# Patient Record
Sex: Female | Born: 1960 | Race: White | Hispanic: No | Marital: Married | Smoking: Never smoker
Health system: Southern US, Community
[De-identification: ages and names within clinical notes are randomized; demographics above are authoritative.]

## PROBLEM LIST (undated history)

## (undated) DIAGNOSIS — F32A Depression, unspecified: Secondary | ICD-10-CM

## (undated) DIAGNOSIS — R200 Anesthesia of skin: Secondary | ICD-10-CM

## (undated) DIAGNOSIS — K219 Gastro-esophageal reflux disease without esophagitis: Secondary | ICD-10-CM

## (undated) DIAGNOSIS — L739 Follicular disorder, unspecified: Secondary | ICD-10-CM

## (undated) DIAGNOSIS — M1990 Unspecified osteoarthritis, unspecified site: Secondary | ICD-10-CM

## (undated) DIAGNOSIS — R011 Cardiac murmur, unspecified: Secondary | ICD-10-CM

## (undated) DIAGNOSIS — D692 Other nonthrombocytopenic purpura: Secondary | ICD-10-CM

## (undated) DIAGNOSIS — I341 Nonrheumatic mitral (valve) prolapse: Secondary | ICD-10-CM

## (undated) DIAGNOSIS — D649 Anemia, unspecified: Secondary | ICD-10-CM

## (undated) DIAGNOSIS — IMO0002 Reserved for concepts with insufficient information to code with codable children: Secondary | ICD-10-CM

## (undated) DIAGNOSIS — Z9889 Other specified postprocedural states: Secondary | ICD-10-CM

## (undated) DIAGNOSIS — T7840XA Allergy, unspecified, initial encounter: Secondary | ICD-10-CM

## (undated) DIAGNOSIS — I059 Rheumatic mitral valve disease, unspecified: Secondary | ICD-10-CM

## (undated) DIAGNOSIS — R9431 Abnormal electrocardiogram [ECG] [EKG]: Secondary | ICD-10-CM

## (undated) DIAGNOSIS — Z5189 Encounter for other specified aftercare: Secondary | ICD-10-CM

## (undated) DIAGNOSIS — F419 Anxiety disorder, unspecified: Secondary | ICD-10-CM

## (undated) DIAGNOSIS — R1310 Dysphagia, unspecified: Secondary | ICD-10-CM

## (undated) DIAGNOSIS — F329 Major depressive disorder, single episode, unspecified: Secondary | ICD-10-CM

## (undated) DIAGNOSIS — I639 Cerebral infarction, unspecified: Secondary | ICD-10-CM

## (undated) DIAGNOSIS — K208 Other esophagitis: Secondary | ICD-10-CM

## (undated) DIAGNOSIS — G459 Transient cerebral ischemic attack, unspecified: Secondary | ICD-10-CM

## (undated) DIAGNOSIS — R112 Nausea with vomiting, unspecified: Secondary | ICD-10-CM

## (undated) DIAGNOSIS — E039 Hypothyroidism, unspecified: Secondary | ICD-10-CM

## (undated) DIAGNOSIS — R55 Syncope and collapse: Secondary | ICD-10-CM

## (undated) HISTORY — PX: UPPER GASTROINTESTINAL ENDOSCOPY: SHX188

## (undated) HISTORY — DX: Major depressive disorder, single episode, unspecified: F32.9

## (undated) HISTORY — DX: Other esophagitis: K20.8

## (undated) HISTORY — DX: Cerebral infarction, unspecified: I63.9

## (undated) HISTORY — DX: Allergy, unspecified, initial encounter: T78.40XA

## (undated) HISTORY — PX: COLONOSCOPY: SHX174

## (undated) HISTORY — DX: Follicular disorder, unspecified: L73.9

## (undated) HISTORY — DX: Hypothyroidism, unspecified: E03.9

## (undated) HISTORY — DX: Nonrheumatic mitral (valve) prolapse: I34.1

## (undated) HISTORY — DX: Gastro-esophageal reflux disease without esophagitis: K21.9

## (undated) HISTORY — DX: Encounter for other specified aftercare: Z51.89

## (undated) HISTORY — DX: Rheumatic mitral valve disease, unspecified: I05.9

## (undated) HISTORY — DX: Depression, unspecified: F32.A

## (undated) HISTORY — DX: Dysphagia, unspecified: R13.10

## (undated) HISTORY — DX: Unspecified osteoarthritis, unspecified site: M19.90

## (undated) HISTORY — PX: APPENDECTOMY: SHX54

## (undated) HISTORY — DX: Anesthesia of skin: R20.0

## (undated) HISTORY — DX: Reserved for concepts with insufficient information to code with codable children: IMO0002

## (undated) HISTORY — DX: Transient cerebral ischemic attack, unspecified: G45.9

## (undated) HISTORY — DX: Abnormal electrocardiogram (ECG) (EKG): R94.31

## (undated) HISTORY — DX: Other nonthrombocytopenic purpura: D69.2

## (undated) HISTORY — DX: Anemia, unspecified: D64.9

## (undated) HISTORY — DX: Syncope and collapse: R55

---

## 1997-11-10 ENCOUNTER — Ambulatory Visit (HOSPITAL_COMMUNITY): Admission: RE | Admit: 1997-11-10 | Discharge: 1997-11-10 | Payer: Self-pay | Admitting: Obstetrics and Gynecology

## 1998-05-14 ENCOUNTER — Ambulatory Visit (HOSPITAL_COMMUNITY): Admission: RE | Admit: 1998-05-14 | Discharge: 1998-05-14 | Payer: Self-pay | Admitting: Obstetrics and Gynecology

## 1998-12-09 ENCOUNTER — Encounter: Payer: Self-pay | Admitting: Internal Medicine

## 1998-12-09 ENCOUNTER — Ambulatory Visit (HOSPITAL_COMMUNITY): Admission: RE | Admit: 1998-12-09 | Discharge: 1998-12-09 | Payer: Self-pay | Admitting: Internal Medicine

## 2001-02-06 ENCOUNTER — Other Ambulatory Visit: Admission: RE | Admit: 2001-02-06 | Discharge: 2001-02-06 | Payer: Self-pay | Admitting: Obstetrics and Gynecology

## 2001-02-11 ENCOUNTER — Encounter: Admission: RE | Admit: 2001-02-11 | Discharge: 2001-02-11 | Payer: Self-pay | Admitting: Obstetrics and Gynecology

## 2001-02-11 ENCOUNTER — Encounter: Payer: Self-pay | Admitting: Obstetrics and Gynecology

## 2002-05-05 ENCOUNTER — Encounter: Payer: Self-pay | Admitting: Obstetrics and Gynecology

## 2002-05-05 ENCOUNTER — Encounter: Admission: RE | Admit: 2002-05-05 | Discharge: 2002-05-05 | Payer: Self-pay | Admitting: Obstetrics and Gynecology

## 2002-05-27 ENCOUNTER — Encounter: Admission: RE | Admit: 2002-05-27 | Discharge: 2002-05-27 | Payer: Self-pay | Admitting: Obstetrics and Gynecology

## 2002-05-27 ENCOUNTER — Encounter: Payer: Self-pay | Admitting: Obstetrics and Gynecology

## 2002-06-30 ENCOUNTER — Encounter: Payer: Self-pay | Admitting: Obstetrics and Gynecology

## 2002-06-30 ENCOUNTER — Encounter: Admission: RE | Admit: 2002-06-30 | Discharge: 2002-06-30 | Payer: Self-pay | Admitting: Obstetrics and Gynecology

## 2002-07-04 ENCOUNTER — Encounter: Payer: Self-pay | Admitting: Obstetrics and Gynecology

## 2002-07-04 ENCOUNTER — Encounter: Admission: RE | Admit: 2002-07-04 | Discharge: 2002-07-04 | Payer: Self-pay | Admitting: Obstetrics and Gynecology

## 2002-08-14 HISTORY — PX: UPPER GASTROINTESTINAL ENDOSCOPY: SHX188

## 2002-10-31 ENCOUNTER — Encounter: Payer: Self-pay | Admitting: Internal Medicine

## 2002-11-12 ENCOUNTER — Encounter: Admission: RE | Admit: 2002-11-12 | Discharge: 2002-11-12 | Payer: Self-pay | Admitting: Obstetrics and Gynecology

## 2002-11-12 ENCOUNTER — Encounter: Payer: Self-pay | Admitting: Obstetrics and Gynecology

## 2003-11-13 LAB — HM MAMMOGRAPHY

## 2003-11-30 ENCOUNTER — Encounter: Admission: RE | Admit: 2003-11-30 | Discharge: 2003-11-30 | Payer: Self-pay | Admitting: Obstetrics and Gynecology

## 2003-12-23 ENCOUNTER — Encounter: Payer: Self-pay | Admitting: Internal Medicine

## 2004-06-07 ENCOUNTER — Other Ambulatory Visit: Admission: RE | Admit: 2004-06-07 | Discharge: 2004-06-07 | Payer: Self-pay | Admitting: Obstetrics and Gynecology

## 2004-07-25 ENCOUNTER — Inpatient Hospital Stay (HOSPITAL_COMMUNITY): Admission: AD | Admit: 2004-07-25 | Discharge: 2004-07-25 | Payer: Self-pay | Admitting: Obstetrics and Gynecology

## 2004-07-27 ENCOUNTER — Encounter: Admission: RE | Admit: 2004-07-27 | Discharge: 2004-07-27 | Payer: Self-pay | Admitting: Obstetrics and Gynecology

## 2004-08-24 ENCOUNTER — Ambulatory Visit: Payer: Self-pay | Admitting: Internal Medicine

## 2004-12-12 LAB — HM COLONOSCOPY: HM Colonoscopy: NORMAL

## 2004-12-26 ENCOUNTER — Encounter: Admission: RE | Admit: 2004-12-26 | Discharge: 2004-12-26 | Payer: Self-pay | Admitting: Obstetrics and Gynecology

## 2005-01-30 ENCOUNTER — Ambulatory Visit: Payer: Self-pay | Admitting: Internal Medicine

## 2005-06-08 ENCOUNTER — Other Ambulatory Visit: Admission: RE | Admit: 2005-06-08 | Discharge: 2005-06-08 | Payer: Self-pay | Admitting: Obstetrics and Gynecology

## 2005-08-14 DIAGNOSIS — T50905A Adverse effect of unspecified drugs, medicaments and biological substances, initial encounter: Secondary | ICD-10-CM

## 2005-08-14 HISTORY — DX: Adverse effect of unspecified drugs, medicaments and biological substances, initial encounter: T50.905A

## 2005-10-04 ENCOUNTER — Ambulatory Visit: Payer: Self-pay | Admitting: Internal Medicine

## 2005-10-13 ENCOUNTER — Encounter: Payer: Self-pay | Admitting: Internal Medicine

## 2005-10-13 ENCOUNTER — Ambulatory Visit: Payer: Self-pay | Admitting: Gastroenterology

## 2005-10-13 ENCOUNTER — Ambulatory Visit (HOSPITAL_COMMUNITY): Admission: RE | Admit: 2005-10-13 | Discharge: 2005-10-13 | Payer: Self-pay | Admitting: Gastroenterology

## 2005-10-13 ENCOUNTER — Encounter (INDEPENDENT_AMBULATORY_CARE_PROVIDER_SITE_OTHER): Payer: Self-pay | Admitting: Specialist

## 2005-10-16 ENCOUNTER — Emergency Department (HOSPITAL_COMMUNITY): Admission: EM | Admit: 2005-10-16 | Discharge: 2005-10-16 | Payer: Self-pay | Admitting: Emergency Medicine

## 2005-10-16 ENCOUNTER — Ambulatory Visit: Payer: Self-pay | Admitting: Gastroenterology

## 2005-10-19 ENCOUNTER — Ambulatory Visit: Payer: Self-pay | Admitting: Internal Medicine

## 2005-10-27 ENCOUNTER — Ambulatory Visit: Payer: Self-pay | Admitting: Internal Medicine

## 2005-11-02 ENCOUNTER — Ambulatory Visit: Payer: Self-pay | Admitting: Internal Medicine

## 2006-01-17 ENCOUNTER — Other Ambulatory Visit: Admission: RE | Admit: 2006-01-17 | Discharge: 2006-01-17 | Payer: Self-pay | Admitting: Obstetrics and Gynecology

## 2006-01-25 ENCOUNTER — Ambulatory Visit (HOSPITAL_COMMUNITY): Admission: RE | Admit: 2006-01-25 | Discharge: 2006-01-25 | Payer: Self-pay | Admitting: Obstetrics and Gynecology

## 2006-06-19 ENCOUNTER — Ambulatory Visit: Payer: Self-pay | Admitting: Internal Medicine

## 2006-06-22 ENCOUNTER — Other Ambulatory Visit: Admission: RE | Admit: 2006-06-22 | Discharge: 2006-06-22 | Payer: Self-pay | Admitting: Obstetrics and Gynecology

## 2006-08-29 ENCOUNTER — Encounter: Admission: RE | Admit: 2006-08-29 | Discharge: 2006-08-29 | Payer: Self-pay | Admitting: Obstetrics and Gynecology

## 2006-10-04 ENCOUNTER — Other Ambulatory Visit: Admission: RE | Admit: 2006-10-04 | Discharge: 2006-10-04 | Payer: Self-pay | Admitting: Obstetrics and Gynecology

## 2006-11-30 ENCOUNTER — Ambulatory Visit: Payer: Self-pay | Admitting: Internal Medicine

## 2007-06-26 ENCOUNTER — Other Ambulatory Visit: Admission: RE | Admit: 2007-06-26 | Discharge: 2007-06-26 | Payer: Self-pay | Admitting: Obstetrics and Gynecology

## 2007-06-26 ENCOUNTER — Encounter: Payer: Self-pay | Admitting: Internal Medicine

## 2007-07-18 DIAGNOSIS — E039 Hypothyroidism, unspecified: Secondary | ICD-10-CM

## 2007-07-18 DIAGNOSIS — K219 Gastro-esophageal reflux disease without esophagitis: Secondary | ICD-10-CM | POA: Insufficient documentation

## 2007-07-18 DIAGNOSIS — I059 Rheumatic mitral valve disease, unspecified: Secondary | ICD-10-CM

## 2007-07-18 DIAGNOSIS — I341 Nonrheumatic mitral (valve) prolapse: Secondary | ICD-10-CM

## 2007-07-18 HISTORY — DX: Gastro-esophageal reflux disease without esophagitis: K21.9

## 2007-07-18 HISTORY — DX: Hypothyroidism, unspecified: E03.9

## 2007-07-18 HISTORY — DX: Rheumatic mitral valve disease, unspecified: I05.9

## 2007-07-18 HISTORY — DX: Nonrheumatic mitral (valve) prolapse: I34.1

## 2007-09-09 ENCOUNTER — Encounter: Admission: RE | Admit: 2007-09-09 | Discharge: 2007-09-09 | Payer: Self-pay | Admitting: Obstetrics and Gynecology

## 2007-09-16 ENCOUNTER — Ambulatory Visit: Payer: Self-pay | Admitting: Internal Medicine

## 2007-09-17 LAB — CONVERTED CEMR LAB
Free T4: 0.7 ng/dL (ref 0.6–1.6)
T3, Free: 2.5 pg/mL (ref 2.3–4.2)
T4, Total: 6.2 ug/dL (ref 5.0–12.5)
TSH: 5.12 microintl units/mL (ref 0.35–5.50)

## 2008-05-11 ENCOUNTER — Ambulatory Visit: Payer: Self-pay | Admitting: Internal Medicine

## 2008-07-20 ENCOUNTER — Other Ambulatory Visit: Admission: RE | Admit: 2008-07-20 | Discharge: 2008-07-20 | Payer: Self-pay | Admitting: Obstetrics and Gynecology

## 2008-09-14 ENCOUNTER — Encounter: Admission: RE | Admit: 2008-09-14 | Discharge: 2008-09-14 | Payer: Self-pay | Admitting: Obstetrics and Gynecology

## 2008-09-15 ENCOUNTER — Telehealth: Payer: Self-pay | Admitting: Internal Medicine

## 2008-10-05 ENCOUNTER — Ambulatory Visit: Payer: Self-pay | Admitting: Internal Medicine

## 2008-10-08 LAB — CONVERTED CEMR LAB
Cholesterol: 234 mg/dL (ref 0–200)
Direct LDL: 105 mg/dL
HDL: 112.8 mg/dL (ref >39.0)
TSH: 6.07 microintl units/mL — ABNORMAL HIGH (ref 0.35–5.50)
Total CHOL/HDL Ratio: 2.1
Triglycerides: 44 mg/dL (ref 0–149)
VLDL: 9 mg/dL (ref 0–40)

## 2009-01-12 ENCOUNTER — Ambulatory Visit: Payer: Self-pay | Admitting: Internal Medicine

## 2009-02-08 ENCOUNTER — Ambulatory Visit: Payer: Self-pay | Admitting: Internal Medicine

## 2009-02-08 ENCOUNTER — Encounter: Payer: Self-pay | Admitting: Internal Medicine

## 2009-02-08 HISTORY — PX: UPPER GASTROINTESTINAL ENDOSCOPY: SHX188

## 2009-02-11 ENCOUNTER — Encounter: Payer: Self-pay | Admitting: Internal Medicine

## 2009-06-17 ENCOUNTER — Ambulatory Visit: Payer: Self-pay | Admitting: Internal Medicine

## 2009-06-17 DIAGNOSIS — D692 Other nonthrombocytopenic purpura: Secondary | ICD-10-CM

## 2009-06-17 DIAGNOSIS — M542 Cervicalgia: Secondary | ICD-10-CM | POA: Insufficient documentation

## 2009-06-17 HISTORY — DX: Other nonthrombocytopenic purpura: D69.2

## 2009-06-18 LAB — CONVERTED CEMR LAB
Basophils Absolute: 0 10*3/uL (ref 0.0–0.1)
Basophils Relative: 0.4 % (ref 0.0–3.0)
Eosinophils Absolute: 0.1 10*3/uL (ref 0.0–0.7)
Eosinophils Relative: 1.1 % (ref 0.0–5.0)
HCT: 37 % (ref 36.0–46.0)
Hemoglobin: 12.6 g/dL (ref 12.0–15.0)
INR: 1 (ref 0.8–1.0)
Lymphocytes Relative: 26.4 % (ref 12.0–46.0)
Lymphs Abs: 2 10*3/uL (ref 0.7–4.0)
MCHC: 34.1 g/dL (ref 30.0–36.0)
MCV: 92.9 fL (ref 78.0–100.0)
Monocytes Absolute: 0.5 10*3/uL (ref 0.1–1.0)
Monocytes Relative: 6.9 % (ref 3.0–12.0)
Neutro Abs: 4.9 10*3/uL (ref 1.4–7.7)
Neutrophils Relative %: 65.2 % (ref 43.0–77.0)
Platelets: 289 10*3/uL (ref 150.0–400.0)
Prothrombin Time: 10.9 s (ref 9.1–11.7)
RBC: 3.98 M/uL (ref 3.87–5.11)
RDW: 12.9 % (ref 11.5–14.6)
TSH: 3.94 microintl units/mL (ref 0.35–5.50)
WBC: 7.5 10*3/uL (ref 4.5–10.5)
aPTT: 27.4 s (ref 21.7–28.8)

## 2009-06-21 ENCOUNTER — Telehealth: Payer: Self-pay | Admitting: Internal Medicine

## 2009-06-21 ENCOUNTER — Encounter: Admission: RE | Admit: 2009-06-21 | Discharge: 2009-08-09 | Payer: Self-pay | Admitting: Internal Medicine

## 2009-06-22 ENCOUNTER — Encounter: Payer: Self-pay | Admitting: Internal Medicine

## 2009-08-03 ENCOUNTER — Encounter: Payer: Self-pay | Admitting: Internal Medicine

## 2009-11-02 ENCOUNTER — Encounter: Admission: RE | Admit: 2009-11-02 | Discharge: 2009-11-02 | Payer: Self-pay | Admitting: Obstetrics and Gynecology

## 2009-11-15 ENCOUNTER — Other Ambulatory Visit: Admission: RE | Admit: 2009-11-15 | Discharge: 2009-11-15 | Payer: Self-pay | Admitting: Obstetrics and Gynecology

## 2010-09-13 NOTE — Miscellaneous (Signed)
Summary: Initial Summary for PT [facility]  Initial Summary for PT [facility]   Imported By: Maryln Gottron 06/28/2009 12:26:34  _____________________________________________________________________  External Attachment:    Type:   Image     Comment:   External Document

## 2010-09-13 NOTE — Procedures (Signed)
Summary: EGD: Normal   EGD  Procedure date:  11/02/2005  Findings:      Location: [facility]   Patient Name: [name], Case MRN: [id number] Procedure Procedures: EsophagoscopyCPT: 43200.  Personnel: Endoscopist: Iva Boop, MD, [facility].  Exam Location: Exam performed in Outpatient Clinic. Outpatient  Patient Consent: Procedure, Alternatives, Risks and Benefits discussed, consent obtained, from patient. Consent was obtained by the RN.  Indications  Surveillance of: Esophageal ulcer with atypical cells on pathology.  History  Current Medications: Patient is not currently taking Coumadin.  Allergies: Patient is allergic to sulfa.  Comments: Esophageal ulcer while on doxycycline. The biopsies showed some atypical cells. Needs reassessment. Her odynophagia is resolved. Pre-Exam Physical: Performed Nov 02, 2005  Cardio-pulmonary exam, HEENT exam, Abdominal exam, Mental status exam WNL.  Comments: Pt. history reviewed/updated, physical exam performed prior to initiation of sedation? YES Exam Exam Info: Maximum depth of insertion Esophagus, intended Esophagus. Patient position: on left side. Gastric retroflexion performed. Images taken. ASA Classification: I. Tolerance: excellent.  Sedation Meds: Patient assessed and found to be appropriate for moderate (conscious) sedation. Fentanyl 50 mcg. given IV. Versed 5 mg. given IV. Cetacaine Spray 2 sprays given aerosolized.  Monitoring: BP and pulse monitoring done. Oximetry used. Supplemental O2 given  Findings - Normal: Proximal Esophagus to Distal Esophagus. Comments: no signs of any ulcer, no scar.   Assessment Normal examination.  Comments: Normal esophagus. Ulcer has resolved and I think that it was due to doxycycline.  Events  Unplanned Intervention: No unplanned interventions were required.  Plans Medication(s): Continue current medications.  Disposition: After procedure patient sent to  recovery. After recovery patient sent home.  Scheduling: Follow-up prn.  Comments: If odynophagia returns, would repeat endoscopy.  CC:   Birdie Sons, MD  This report was created from the original endoscopy report, which was reviewed and signed by the above listed endoscopist.

## 2010-09-13 NOTE — Procedures (Signed)
Summary: EGD/dli: inflamm gastric nodule   EGD  Procedure date:  02/08/2009  Findings:      Location: [facility]    EGD  Procedure date:  02/08/2009  Findings:      1) Nodule in the antrum, biopsied (INFLAMMATORY) 2) Otherwise normal examination. 3) No stricture seen but clear history of dysphagia so 50 Fr Maloney dilator passed  ENDOSCOPY PROCEDURE REPORT  PATIENT:  [name] Case, [name] Case  MR#:  [id number] BIRTHDATE:   [birth year], 47 yrs. old   GENDER:   female  ENDOSCOPIST:   Iva Boop, MD, [facility]    PROCEDURE DATE:  02/08/2009 PROCEDURE:  EGD with biopsy, Elease Hashimoto Dilation of the Esophagus ASA CLASS:   Class II INDICATIONS: 1) dysphagia  2) epigastric pain   MEDICATIONS:    Fentanyl 50 mcg, Versed 7 mg IV TOPICAL ANESTHETIC:   Exactacain Spray  DESCRIPTION OF PROCEDURE:   After the risks benefits and alternatives of the procedure were thoroughly explained, informed consent was obtained.  The [facility] [id number] endoscope was introduced through the mouth and advanced to the second portion of the duodenum, without limitations.  The instrument was slowly withdrawn as the mucosa was carefully examined. <<PROCEDUREIMAGES>>      <<OLD IMAGES>>  A nodule was found in the antrum. 5 mm whitish nodule in pre-pyloric antrum. With standard forceps, a biopsy was obtained and sent to pathology.  The examination was otherwise normal. Z-line at 36 cm.    Dilation was then performed at the total esophagus  1) Dilator:   Elease Hashimoto   Size(s):   50 [place] Resistance:   minimal   Heme:   none     COMPLICATIONS:   None  ENDOSCOPIC IMPRESSION:  1) Nodule in the antrum, biopsied  2) Otherwise normal examination.  3) No stricture seen but clear history of dysphagia so 50 Fr Maloney dilator passed RECOMMENDATIONS:  Clear liquids until 4 PM then soft diet and normal diet starts tomorrow.   Continue Protonix (will send Rx for 30 mg each AM # 30 11 refills)    Try daily  MiraLax for constipation and take Align 1 each day for 1 month to help bloating    Follow-up Dr. Leone Payor as needed (if not satisfactorily improved)  REPEAT EXAM:   No    Iva Boop, MD, [facility]   CC: The Patient,    REPORT OF SURGICAL PATHOLOGY   Case #: [id number] Patient Name: Barman, Sutton B. Office Chart Number:  [id number]   MRN: [id number] Pathologist: Alden Server A. Delila Spence, MD DOB/Age  13-Dec-1960 (Age: 75)    Gender: F Date Taken:  02/08/2009 Date Received: 02/09/2009   FINAL DIAGNOSIS   ***MICROSCOPIC EXAMINATION AND DIAGNOSIS***   STOMACH, ANTRUM, BIOPSY:   -  POLYPOID FRAGMENT OF GASTRIC MUCOSA WITH ULCERATION, SURFACE FIBRINOUS EXUDATE AND UNDERLYING MARKEDLY INFLAMED GRANULATION TISSUE. -  NO MALIGNANCY IDENTIFIED.   COMMENT A Warthin-Starry stain is performed to determine the possibility of the presence of Helicobacter pylori. The Warthin-Starry stain is negative for organisms of Helicobacter pylori. The control(s) stained appropriately.   The underlying inflamed granulation tissue shows fibroblasts and vascular structures with plump endothelial cell nuclei with slight atypia. However, these changes are considered reactive.  No malignancy is identified.  (EA:jy) 02/10/09   jy Date Reported:  02/10/2009     Lyn Hollingshead. Delila Spence, MD *** Electronically Signed Out By EAA ***    February 11, 2009 MRN: [id number]    [facility] [address] [place],  [place]  [address]    Dear Ms. Mayford Knife,  I am pleased to inform you that the biopsies taken during your recent endoscopic examination did not show any significant problems. The nodule was an area of inflammation.I cannot tell exactly what the cause was but do not expect this to be chronic or problematic.  I hope you are swallowing better after the dilation.   Please call us if you are having persistent problems or have questions about your condition that have not been fully answered at this  time.    Sincerely,  Iva Boop MD  This letter has been electronically signed by your physician.  This report was created from the original endoscopy report, which was reviewed and signed by the above listed endoscopist.

## 2010-09-13 NOTE — Assessment & Plan Note (Signed)
Summary: cough/njr   Vital Signs:  Patient Profile:   50 Years Old Female Weight:      136 pounds Temp:     98.3 degrees F oral BP sitting:   108 / 78  (left arm) Cuff size:   regular  Vitals Entered By: Raechel Ache, RN (May 11, 2008 2:40 PM)                 Chief Complaint:  C/o sick 8 days with fatigue, headache, and raw throat and dry cough..  History of Present Illness: 50 year old female with an 8 day history of mild sore throat, nonproductive cough and malaise    Current Allergies: ! SULFAMETHOXAZOLE (SULFAMETHOXAZOLE) ! TETRACYCLINE HCL (TETRACYCLINE HCL)      Physical Exam  General:     Well-developed,well-nourished,in no acute distress; alert,appropriate and cooperative throughout examination Head:     Normocephalic and atraumatic without obvious abnormalities. No apparent alopecia or balding. Eyes:     No corneal or conjunctival inflammation noted. EOMI. Perrla. Funduscopic exam benign, without hemorrhages, exudates or papilledema. Vision grossly normal. Ears:     External ear exam shows no significant lesions or deformities.  Otoscopic examination reveals clear canals, tympanic membranes are intact bilaterally without bulging, retraction, inflammation or discharge. Hearing is grossly normal bilaterally. Mouth:     pharyngeal erythema.   Neck:     No deformities, masses, or tenderness noted. Lungs:     Normal respiratory effort, chest expands symmetrically. Lungs are clear to auscultation, no crackles or wheezes. Heart:     Normal rate and regular rhythm. S1 and S2 normal without gallop, murmur, click, rub or other extra sounds.    Impression & Recommendations:  Problem # 1:  URI (ICD-465.9)  Her updated medication list for this problem includes:    Hydrocodone-homatropine 5-1.5 Mg/7ml Syrp (Hydrocodone-homatropine) .Marland Kitchen... 1 teaspoon every 6 hours as needed for cough   Complete Medication List: 1)  Nexium 40 Mg Cpdr (Esomeprazole  magnesium) .... Take 1 tablet by mouth once a day 2)  Synthroid 88 Mcg Tabs (Levothyroxine sodium) .... Take 1 tablet by mouth once a day 3)  Caltrate 600+d 600-400 Mg-unit Tabs (Calcium carbonate-vitamin d) .... Once daily 4)  Multivitamins Tabs (Multiple vitamin) .... Once daily 5)  B-100 Complex Tabs (Vitamins-lipotropics) .... Once daily 6)  Hydrocodone-homatropine 5-1.5 Mg/60ml Syrp (Hydrocodone-homatropine) .Marland Kitchen.. 1 teaspoon every 6 hours as needed for cough   Patient Instructions: 1)  Get plenty of rest, drink lots of clear liquids, and use Tylenol or Ibuprofen for fever and comfort. Return in 7-10 days if you're not better:sooner if you're feeling worse. 2)  Take 650-1000mg  of Tylenol every 4-6 hours as needed for relief of pain or comfort of fever AVOID taking more than 4000mg   in a 24 hour period (can cause liver damage in higher doses).   Prescriptions: HYDROCODONE-HOMATROPINE 5-1.5 MG/5ML SYRP (HYDROCODONE-HOMATROPINE) 1 teaspoon every 6 hours as needed for cough  #6oz x 0   Entered and Authorized by:   Gordy Savers  MD   Signed by:   Gordy Savers  MD on 05/11/2008   Method used:   Print then Give to Patient   RxID:   [id number] SYNTHROID 88 MCG TABS (LEVOTHYROXINE SODIUM) Take 1 tablet by mouth once a day  #90 x 10   Entered and Authorized by:   Gordy Savers  MD   Signed by:   Gordy Savers  MD on 05/11/2008   Method used:  Print then Give to Patient   RxID:   [id number] NEXIUM 40 MG CPDR (ESOMEPRAZOLE MAGNESIUM) Take 1 tablet by mouth once a day  #90 x 6   Entered and Authorized by:   Gordy Savers  MD   Signed by:   Gordy Savers  MD on 05/11/2008   Method used:   Print then Give to Patient   RxID:   [email or web address]  ]

## 2010-09-13 NOTE — Assessment & Plan Note (Signed)
Summary: FU ON MEDS/NJR   Vital Signs:  Patient Profile:   50 Years Old Female Weight:      136 pounds Temp:     97.8 degrees F Pulse rate:   80 / minute Resp:     14 per minute BP sitting:   98 / 62  (left arm)  Vitals Entered By: Gladis Riffle, RN (October 05, 2008 11:36 AM)                 Chief Complaint:  f/u.  History of Present Illness: Hypothroid- tolerating meds well needs f/u no cold or heat intolerance no hair loss no other complaints  Past Medical History: sebaceous neoplasm GERD Hypothyroidism ? mvp Past Surgical History:  Social History: Married Never Smoked Regular exercise-yes  Family History: no DM no other complaints in a complete ROS     Prior Medications Reviewed Using: Patient Recall  Updated Prior Medication List: NEXIUM 40 MG CPDR (ESOMEPRAZOLE MAGNESIUM) Take 1 tablet by mouth once a day SYNTHROID 88 MCG TABS (LEVOTHYROXINE SODIUM) Take 1 tablet by mouth once a day CALTRATE 600+D 600-400 MG-UNIT  TABS (CALCIUM CARBONATE-VITAMIN D) once daily MULTIVITAMINS   TABS (MULTIPLE VITAMIN) once daily  Current Allergies (reviewed today): ! SULFAMETHOXAZOLE (SULFAMETHOXAZOLE) ! TETRACYCLINE HCL (TETRACYCLINE HCL)      Physical Exam  General:     Well-developed,well-nourished,in no acute distress; alert,appropriate and cooperative throughout examination Head:     atraumatic and no abnormalities observed.   Eyes:     pupils equal and pupils round.   Ears:     R ear normal and L ear normal.   Nose:     no external deformity and no external erythema.   Neck:     No deformities, masses, or tenderness noted. Lungs:     Normal respiratory effort, chest expands symmetrically. Lungs are clear to auscultation, no crackles or wheezes. Heart:     Normal rate and regular rhythm. S1 and S2 normal without gallop, murmur, click, rub or other extra sounds. Abdomen:     Bowel sounds positive,abdomen soft and non-tender without masses, organomegaly  or hernias noted. Msk:     No deformity or scoliosis noted of thoracic or lumbar spine.   Neurologic:     cranial nerves II-XII intact and strength normal in all extremities.   Skin:     turgor normal and color normal.   Psych:     normally interactive and good eye contact.      Impression & Recommendations:  Problem # 1:  HYPOTHYROIDISM (ICD-244.9) needs f/u   Her updated medication list for this problem includes:    Synthroid 88 Mcg Tabs (Levothyroxine sodium) .Marland Kitchen... Take 1 tablet by mouth once a day  Orders: Venipuncture (16109) TLB-TSH (Thyroid Stimulating Hormone) (84443-TSH) TLB-Lipid Panel (80061-LIPID)   Problem # 2:  GERD (ICD-530.81) tolerating meds Her updated medication list for this problem includes:    Nexium 40 Mg Cpdr (Esomeprazole magnesium) .Marland Kitchen... Take 1 tablet by mouth once a day   Problem # 3:  MITRAL VALVE PROLAPSE (ICD-424.0) no sxs  Complete Medication List: 1)  Nexium 40 Mg Cpdr (Esomeprazole magnesium) .... Take 1 tablet by mouth once a day 2)  Synthroid 88 Mcg Tabs (Levothyroxine sodium) .... Take 1 tablet by mouth once a day 3)  Caltrate 600+d 600-400 Mg-unit Tabs (Calcium carbonate-vitamin d) .... Once daily 4)  Multivitamins Tabs (Multiple vitamin) .... Once daily

## 2010-09-13 NOTE — Miscellaneous (Signed)
Summary: Discharge Summary for PT Services/[facility]  Discharge Summary for PT Services/[facility]   Imported By: Maryln Gottron 08/26/2009 11:15:17  _____________________________________________________________________  External Attachment:    Type:   Image     Comment:   External Document

## 2010-09-13 NOTE — Procedures (Signed)
Summary: EGD w/biopsy/[facility]  EGD w/biopsy/[facility]   Imported By: Sherian Rein 01/12/2009 15:22:42  _____________________________________________________________________  External Attachment:    Type:   Image     Comment:   External Document

## 2010-09-13 NOTE — Procedures (Signed)
Summary: EGD/bx: pill esophagitis/ulcer  EGD w/biopsy/[facility]   Imported By: Sherian Rein 01/12/2009 15:24:09  _____________________________________________________________________  External Attachment:    Type:   Image     Comment:   External Document

## 2010-09-13 NOTE — Progress Notes (Signed)
Summary: new rx nexium  Phone Note Call from Patient Call back at [phone]   Caller: pt live Call For: Swords Summary of Call: Patient needs a new rx for nexium 40mg  1cap by mouth once a day for [facility] need a 90 day supply. Call when ready to pick up this is the first time she is sending it to [facility]. Initial call taken by: Celine Ahr,  September 15, 2008 10:34 AM  Follow-up for Phone Call        Rx printed and will be at front desk for pick up.  Also needfs ov.Patient notified by message on home phone.  Note of needing ov in same envelope. Follow-up by: Gladis Riffle, RN,  September 15, 2008 12:20 PM      Prescriptions: NEXIUM 40 MG CPDR (ESOMEPRAZOLE MAGNESIUM) Take 1 tablet by mouth once a day  #90 day x 4   Entered by:   Gladis Riffle, RN   Authorized by:   Birdie Sons MD   Signed by:   Gladis Riffle, RN on 09/15/2008   Method used:   Print then Give to Patient   RxID:   [id number]

## 2010-09-13 NOTE — Progress Notes (Signed)
Summary: REQ REFILL ON levothyroxine  Phone Note Call from Patient   Caller: Patient Reason for Call: Refill Medication Summary of Call: PT CALLED TO REQ A REFILL ON MED (SYNTHROID 100 MCG)... REQ SAME TO BE CALLED INTO [facility].... PT CONTACT # [phone] WITH ANY QUESTIONS // RS Initial call taken by: Debbra Riding,  June 21, 2009 10:26 AM  Follow-up for Phone Call        Rx done electronically  Left message on machine to inform pt. Follow-up by: Gladis Riffle, RN,  June 21, 2009 11:57 AM    Prescriptions: LEVOTHYROXINE SODIUM 100 MCG TABS (LEVOTHYROXINE SODIUM) Take 1 tablet by mouth once a day  #90 x 3   Entered by:   Gladis Riffle, RN   Authorized by:   Birdie Sons MD   Signed by:   Gladis Riffle, RN on 06/21/2009   Method used:   Electronically to        [facility]* (mail-order)             ,          Ph: [phone]       Fax: [phone]   RxID:   [id number]

## 2010-09-13 NOTE — Procedures (Signed)
Summary: Colonoscopy: Normal    Colonoscopy  Procedure date:  12/23/2003  Findings:      Results: Normal. Location:   Endoscopy Center.    Procedures Next Due Date:    Colonoscopy: 12/2004  Patient Name: Case, [name] MRN: [id number] Procedure Procedures: Colonoscopy CPT: 36644.  Personnel: Endoscopist: Iva Boop, MD, [facility].  Referred By: Valetta Mole Swords, MD.  Exam Location: Exam performed in Outpatient Clinic. Outpatient  Patient Consent: Procedure, Alternatives, Risks and Benefits discussed, consent obtained, from patient. Consent was obtained by the RN.  Indications Symptoms: Constipation Melena (unknown source).  Comments: ? MUIR-TORRE'S SYNDROME (INCREASED RISK OF HNPCC) History  Current Medications: Patient is not currently taking Coumadin.  Pre-Exam Physical: Performed Dec 23, 2003. Cardio-pulmonary exam, Rectal exam, HEENT exam , Abdominal exam, Mental status exam WNL.  Exam Exam: Extent of exam reached: Cecum, extent intended: Cecum.  The cecum was identified by appendiceal orifice and IC valve. Patient position: on left side. Colon retroflexion performed. Images taken. ASA Classification: I. Tolerance: good.  Monitoring: Pulse and BP monitoring, Oximetry used. Supplemental O2 given.  Colon Prep Used MiraLax for colon prep. Prep results: excellent.  Sedation Meds: Patient assessed and found to be appropriate for moderate (conscious) sedation. Fentanyl 50 mcg. given IV. Versed 5 mg. given IV.  Findings - NORMAL EXAM: Cecum to Rectum.   Assessment Normal examination.  Comments: NO POLYPS OR CANCER SEEN Events  Unplanned Interventions: No intervention was required.  Plans Comments: CONTINUE GLYCOLAX, AWAIT IMPROVEMENT IN THYROID FUNCTION. CONSIDER ADDING ZELNORM IF GLYCOLAX NOT HELPING IN 1-2 WEEKS. Disposition: After procedure patient sent to recovery. After recovery patient sent home.  Comments: AWAIT GENETICS EVALUATION AT  [facility]. WILL RECALL FOR 1 YEAR COLONOSCOPY AND REVIEW AT THAT TIME.   CC:   Birdie Sons, MD  This report was created from the original endoscopy report, which was reviewed and signed by the above listed endoscopist.

## 2010-09-13 NOTE — Assessment & Plan Note (Signed)
Summary: follow up/mhf reschedule/mhf   Vital Signs:  Patient Profile:   50 Years Old Female Weight:      137.5 pounds Temp:     98.1 degrees F Pulse rate:   68 / minute BP sitting:   102 / 70  Vitals Entered By: Gladis Riffle, RN (September 16, 2007 1:59 PM)                 Chief Complaint:  saw gyn in Oct and told to see PCP about thyroid--c/o hot flashes.  History of Present Illness: There was some question about thyroid dose  she feels great. She has been hypothyroid for 13 years.   Current Allergies (reviewed today): ! SULFAMETHOXAZOLE (SULFAMETHOXAZOLE) ! TETRACYCLINE HCL (TETRACYCLINE HCL)   Family History:    Reviewed history from 07/18/2007 and no changes required:       no DM  Social History:    Married    Never Smoked    Regular exercise-yes   Risk Factors:  Tobacco use:  never Exercise:  yes   Review of Systems       no other complaints in a complete ROS    Physical Exam  General:     Well-developed,well-nourished,in no acute distress; alert,appropriate and cooperative throughout examination Head:     normocephalic and atraumatic.   Eyes:     pupils equal and pupils round.   Neck:     No deformities, masses, or tenderness noted. Lungs:     Normal respiratory effort, chest expands symmetrically. Lungs are clear to auscultation, no crackles or wheezes. Heart:     Normal rate and regular rhythm. S1 and S2 normal without gallop, murmur, click, rub or other extra sounds.    Impression & Recommendations:  Problem # 1:  HYPOTHYROIDISM (ICD-244.9) reviewed labs from dr. Jenkins [place] needs f/u Her updated medication list for this problem includes:    Synthroid 88 Mcg Tabs (Levothyroxine sodium) .Marland Kitchen... Take 1 tablet by mouth once a day  Orders: Venipuncture (16109) TLB-TSH (Thyroid Stimulating Hormone) (84443-TSH) TLB-T4 (Thyrox), Free [phone]) TLB-T4 (Thyrox), Total (84436-T4) TLB-T3, Free (Triiodothyronine) (84481-T3FREE)   Complete  Medication List: 1)  Nexium 40 Mg Cpdr (Esomeprazole magnesium) .... Take 1 tablet by mouth once a day 2)  Synthroid 88 Mcg Tabs (Levothyroxine sodium) .... Take 1 tablet by mouth once a day 3)  Caltrate 600+d 600-400 Mg-unit Tabs (Calcium carbonate-vitamin d) .... Once daily 4)  Multivitamins Tabs (Multiple vitamin) .... Once daily 5)  B-100 Complex Tabs (Vitamins-lipotropics) .... Once daily     ]

## 2010-09-13 NOTE — Assessment & Plan Note (Signed)
Summary: ACID REFLUX/DYSPHAGIA    History of Present Illness Visit Type: Initial Consult Primary GI MD: Stan Head MD [facility] Primary Provider: Birdie Sons, MD Chief Complaint: GERD History of Present Illness:   Four month history of intermittient soild dysphagia and now about every time she eats solid foods. uprasternal sticking poin, waits or drinks water to go down. Some regurgitation. Also having epigastric burning 4-5 days out of the week. compliant with Nexium. No new medications.  Constipation as before, moving bowels several times a week, also with bloating problems. Uses intermittent MiraLax and can get results within 12 hours.   GI Review of Systems    Reports acid reflux, bloating, dysphagia with solids, and  heartburn.      Denies abdominal pain, belching, chest pain, dysphagia with liquids, loss of appetite, nausea, vomiting, vomiting blood, weight loss, and  weight gain.      Reports constipation.     Denies anal fissure, black tarry stools, change in bowel habit, diarrhea, diverticulosis, fecal incontinence, heme positive stool, hemorrhoids, irritable bowel syndrome, jaundice, light color stool, liver problems, rectal bleeding, and  rectal pain.    Current Medications (verified): 1)  Nexium 40 Mg Cpdr (Esomeprazole Magnesium) .... Take 1 Tablet By Mouth Once A Day 2)  Levothyroxine Sodium 100 Mcg Tabs (Levothyroxine Sodium) .... Take 1 Tablet By Mouth Once A Day 3)  Caltrate 600+d 600-400 Mg-Unit  Tabs (Calcium Carbonate-Vitamin D) .... Once Daily 4)  Multivitamins   Tabs (Multiple Vitamin) .... Once Daily  Allergies (verified): 1)  ! Sulfamethoxazole (Sulfamethoxazole) 2)  ! Tetracycline Hcl (Tetracycline Hcl)  Past History:  Past Medical History: sebaceous neoplasm GERD/esophageal stricture (2004) Hypothyroidism ? mvp Pill esophagitis/ulcer (doxycycline) 2007 Hyperplastic gastric polyp 2004  Past Surgical History: Appendectomy  Family History: Reviewed  history from 07/18/2007 and no changes required. no DM  Social History: Married, 2 daughters Never Smoked Regular exercise-yes Occupation:Housewife  Alcohol Use - yes -wine Daily Caffeine Use -1 Illicit Drug Use - no Drug Use:  no  Review of Systems       The patient complains of back pain.         Back pain is chronic All other ROS negative except as per HPI.   Vital Signs:  Patient profile:   50 year old female Height:      66 inches Weight:      134.25 pounds BMI:     21.75 Pulse rate:   60 / minute Pulse rhythm:   regular BP sitting:   110 / 60  (left arm) Cuff size:   regular  Vitals Entered By: June McMurray CMA (January 12, 2009 10:42 AM)  Physical Exam  General:  Well developed, well nourished, no acute distress. Eyes:  PERRLA, no icterus. Mouth:  No deformity or lesions, dentition normal. Neck:  Supple; no masses or thyromegaly. Lungs:  Clear throughout to auscultation. Heart:  Regular rate and rhythm; no murmurs, rubs,  or bruits. Abdomen:  Soft and nondistended. No masses, hepatosplenomegaly or hernias noted. Normal bowel sounds. Mildly tender LLQ. Extremities:  No clubbing, cyanosis, edema or deformities noted. Neurologic:  Alert and  oriented x4;  grossly normal neurologically. Skin:  tanned Cervical Nodes:  No significant cervical or supraclavicular adenopathy.  Psych:  Alert and cooperative. Normal mood and affect.   Impression & Recommendations:  Problem # 1:  DYSPHAGIA (WJX-914.78) Assessment New Distal esophageal stricture dilated 2004. No subsequent stricture when she had EGD's in 2005 (? of Muir-Torre syndrome -  did not pan out) and 2007 (doxycycline ulcer). ? if Nexium tachyphylaxis at this time and recurrent stricture. No thyromegaly on exam so doubt that.  Orders: EGD (EGD) 02/08/09 possible esophageal dilation Will also change to Protonix at this time  Problem # 2:  ABDOMINAL PAIN-EPIGASTRIC (ICD-789.06) Assessment: New Burning in  epigastrium. Could be GERD. Change nexium to Protonix. Orders: EGD (EGD)  Problem # 3:  GERD (ICD-530.81) Assessment: Deteriorated Suepect epigastric burning is due to this. Change Nexium to Protonix as tachyphylaxis to Nexium suspected.  Problem # 4:  CONSTIPATION (ICD-564.00) Assessment: Comment Only With bloating problems. Chronic and negative colonoscopy 2005. Once EGD complete will try Align most likely, to help bloating. Bary Leriche moves bowels ok without too much quality of life problems, it seems.  Problem # 5:  SPECIAL SCREENING FOR MALIGNANT NEOPLASMS COLON (ICD-V76.51) Assessment: Comment Only She should have a colonoscopy recall 7 years after last exam (12/2010)  Patient Instructions: 1)  Chew carefully and avoid foods that cause swallowing difficulty until your endoscopy and dilation. 2)  Since it will be scheduled on 6/28, we will try a new PPI medicine now,  samples of Protonix provided to replace Nexium. 3)  [facility] Patient Information Guide given to patient.  4)  The medication list was reviewed and reconciled.  All changed / newly prescribed medications were explained.  A complete medication list was provided to the patient / caregiver.

## 2010-09-13 NOTE — Letter (Signed)
Summary: Patient Notice-Endo Biopsy Results  [facility]  [address] [place], [place] [address]   Phone: [phone]  Fax: [phone]        February 11, 2009 MRN: [id number]    [facility] [address] [place], [place]  [address]    Dear Ms. HEUBERGER,  I am pleased to inform you that the biopsies taken during your recent endoscopic examination did not show any significant problems. The nodule was an area of inflammation.I cannot tell exactly what the cause was but do not expect this to be chronic or problematic.  I hope you are swallowing better after the dilation.   Please call us if you are having persistent problems or have questions about your condition that have not been fully answered at this time.    Sincerely,  Iva Boop MD  This letter has been electronically signed by your physician.

## 2010-09-13 NOTE — Assessment & Plan Note (Signed)
Summary: right arm tingling-bruising x several weeks//ccm   Vital Signs:  Patient profile:   50 year old female Weight:      131 pounds Temp:     97.9 degrees F Pulse rate:   66 / minute Resp:     12 per minute BP sitting:   112 / 78  (left arm)  Vitals Entered By: Gladis Riffle, RN (June 17, 2009 11:16 AM)   Primary Care Provider:  Birdie Sons, MD   History of Present Illness: c/o neck pain which radiates to shoulder and to mid biceps area. Started in September. Pain is throbbing but waxes and wanes. Activity typically worsens pain. She describes an associated tingling sensation of Right arm. no weakness no trauma last week she noted two bruises around right elbow---no known trauma, no other bleeding cocerns.   All other systems reviewed and were negative   Preventive Screening-Counseling & Management  Alcohol-Tobacco     Smoking Status: never  Current Problems (verified): 1)  Mitral Valve Prolapse  (ICD-424.0) 2)  Hypothyroidism  (ICD-244.9) 3)  Gerd  (ICD-530.81)  Current Medications (verified): 1)  Protonix 40 Mg  Tbec (Pantoprazole Sodium) .Marland Kitchen.. 1 Each Day 30 Minutes Before Meal 2)  Levothyroxine Sodium 100 Mcg Tabs (Levothyroxine Sodium) .... Take 1 Tablet By Mouth Once A Day 3)  Caltrate 600+d 600-400 Mg-Unit  Tabs (Calcium Carbonate-Vitamin D) .... Once Daily 4)  Multivitamins   Tabs (Multiple Vitamin) .... Once Daily  Allergies: 1)  ! Sulfamethoxazole (Sulfamethoxazole) 2)  ! 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 Gladis Riffle, RN (June 17, 2009 11:18 AM)  Past History:  Past Medical History: Last updated: 01/12/2009 sebaceous neoplasm GERD/esophageal  stricture (2004) Hypothyroidism ? mvp Pill esophagitis/ulcer (doxycycline) 2007 Hyperplastic gastric polyp 2004  Past Surgical History: Last updated: 01/12/2009 Appendectomy  Family History: Last updated: 07/18/2007 no DM  Social History: Last updated: 01/12/2009 Married, 2 daughters Never Smoked Regular exercise-yes Occupation:Housewife  Alcohol Use - yes -wine Daily Caffeine Use -1 Illicit Drug Use - no  Risk Factors: Exercise: yes (09/16/2007)  Risk Factors: Smoking Status: never (06/17/2009)  Review of Systems       All other systems reviewed and were negative   Physical Exam  General:  Well developed, well nourished, no acute distress. Head:  normocephalic and atraumatic.   Eyes:  pupils equal and pupils round.   Neck:  Supple; no masses or thyromegaly. Lungs:  Clear throughout to auscultation. Abdomen:  Soft and nondistended. No masses, hepatosplenomegaly or hernias noted. Normal bowel sounds. Mildly tender LLQ. Msk:  No deformity or scoliosis noted of thoracic or lumbar spine.   Pulses:  R radial normal, R dorsalis pedis normal, and L radial normal.   Neurologic:  cranial nerves II-XII intact and gait normal.   DTRs upper and normal extremities normal   Impression & Recommendations:  Problem # 1:  NECK PAIN (ICD-723.1) unclear etiology could be radiculopathy trial nsais side effectis discussed Her updated medication list for this problem includes:    Meloxicam 7.5 Mg Tabs (Meloxicam) ..... 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 1 each  day 30 minutes before meal 2)  Levothyroxine Sodium 100 Mcg Tabs (Levothyroxine sodium) .... Take 1 tablet by mouth once a day 3)  Caltrate 600+d 600-400 Mg-unit Tabs (Calcium carbonate-vitamin d) .... Once daily 4)  Multivitamins Tabs (Multiple vitamin) .... Once daily 5)  Meloxicam 7.5 Mg Tabs (Meloxicam) .... One by mouth 1daily Prescriptions: MELOXICAM 7.5 MG  TABS (MELOXICAM) one by mouth 1daily  #30 x 0   Entered and Authorized by:   Birdie Sons MD   Signed by:   Birdie Sons MD on 06/17/2009   Method used:   Electronically to        [facility] [address] [phone] (retail)       [address]       [place], [place]  [address]       Ph: [phone] or [phone]       Fax: [phone]   RxID:   [id number]

## 2010-10-07 ENCOUNTER — Encounter: Payer: Self-pay | Admitting: Internal Medicine

## 2010-10-10 ENCOUNTER — Ambulatory Visit (INDEPENDENT_AMBULATORY_CARE_PROVIDER_SITE_OTHER): Payer: Self-pay | Admitting: Internal Medicine

## 2010-10-10 ENCOUNTER — Encounter: Payer: Self-pay | Admitting: Internal Medicine

## 2010-10-10 DIAGNOSIS — E039 Hypothyroidism, unspecified: Secondary | ICD-10-CM

## 2010-10-10 DIAGNOSIS — F411 Generalized anxiety disorder: Secondary | ICD-10-CM

## 2010-10-10 DIAGNOSIS — F419 Anxiety disorder, unspecified: Secondary | ICD-10-CM

## 2010-10-10 LAB — TSH: TSH: 9.61 u[IU]/mL — ABNORMAL HIGH (ref 0.35–5.50)

## 2010-10-10 MED ORDER — SERTRALINE HCL 50 MG PO TABS: 50.0000 mg | ORAL_TABLET | Freq: Every day | ORAL | Status: DC

## 2010-10-10 NOTE — Progress Notes (Signed)
  Subjective:    Patient ID: [name] Case, female    DOB: 08/23/1960, 49 y.o.   MRN: [id number]  HPI  See AP  Review of Systems     Objective:   Physical Exam        Assessment & Plan:

## 2010-10-10 NOTE — Assessment & Plan Note (Addendum)
Discussed at length total time 25 minutes all FTF Trial zoloft Side effects discussed

## 2010-10-13 MED ORDER — LEVOTHYROXINE SODIUM 125 MCG PO TABS: 125.0000 ug | ORAL_TABLET | Freq: Every day | ORAL | Status: DC

## 2010-10-13 NOTE — Progress Notes (Signed)
Addended byAlfred Levins on: 10/13/2010 04:13 PM   Modules accepted: Orders

## 2010-11-14 ENCOUNTER — Ambulatory Visit: Payer: BC Managed Care – PPO | Admitting: Internal Medicine

## 2010-11-29 ENCOUNTER — Encounter: Payer: Self-pay | Admitting: Internal Medicine

## 2010-11-29 ENCOUNTER — Ambulatory Visit (INDEPENDENT_AMBULATORY_CARE_PROVIDER_SITE_OTHER): Payer: BC Managed Care – PPO | Admitting: Internal Medicine

## 2010-11-29 DIAGNOSIS — F411 Generalized anxiety disorder: Secondary | ICD-10-CM

## 2010-11-29 DIAGNOSIS — F419 Anxiety disorder, unspecified: Secondary | ICD-10-CM

## 2010-11-29 MED ORDER — SERTRALINE HCL 100 MG PO TABS: 100.0000 mg | ORAL_TABLET | Freq: Every day | ORAL | Status: DC

## 2010-11-29 NOTE — Progress Notes (Signed)
  Subjective:    Patient ID: [name] Case, female    DOB: May 08, 1961, 50 y.o.   MRN: [id number]  HPI  Feels much better on zoloft Still anxious especially related to work but much better No side effects of meds Past Medical History  Diagnosis Date  . GERD 07/18/2007  . HYPOTHYROIDISM 07/18/2007  . MITRAL VALVE PROLAPSE 07/18/2007  . NECK PAIN 06/17/2009  . 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 Appendectomy     reports that she has never smoked. She does not have any smokeless tobacco history on file. She reports that she drinks alcohol. She reports that she does not use illicit drugs. family history includes Hypertension in her brother. Allergies  Allergen Reactions  . Sulfamethoxazole     REACTION: hives  . Tetracycline Hcl     REACTION: ulcers in throat       Review of Systems  patient denies chest pain, shortness of breath, orthopnea. Denies lower extremity edema, abdominal pain, change in appetite, change in bowel movements. Patient denies rashes, musculoskeletal complaints. No other specific complaints in a complete review of systems.      Objective:   Physical Exam  Well-developed well-nourished female in no acute distress.  Chest clear to auscultation without increased work of breathing. Cardiac exam S1 and S2 are regular. Abdominal exam active bowel sounds, soft, nontender. Extremities no edema. Neurologic exam she is alert without any motor sensory deficits.        Assessment & Plan:

## 2010-12-01 NOTE — Assessment & Plan Note (Signed)
Much improved Continue current meds F/u 6 months

## 2011-01-02 ENCOUNTER — Other Ambulatory Visit: Payer: Self-pay | Admitting: Internal Medicine

## 2011-01-10 ENCOUNTER — Other Ambulatory Visit: Payer: Self-pay | Admitting: Internal Medicine

## 2011-01-10 ENCOUNTER — Telehealth: Payer: Self-pay | Admitting: Gastroenterology

## 2011-01-10 ENCOUNTER — Encounter: Payer: Self-pay | Admitting: Internal Medicine

## 2011-01-10 MED ORDER — PANTOPRAZOLE SODIUM 40 MG PO TBEC: DELAYED_RELEASE_TABLET | ORAL | Status: AC

## 2011-01-10 NOTE — Telephone Encounter (Signed)
Patient called and made an appoint medication refilled until appointment in July.

## 2011-01-10 NOTE — Telephone Encounter (Signed)
Received a faxed refill request from [facility] for Pantoprazole. Medication denied. Patient needs an appointment. Hasn't been seen since 01/2009. Left message for patient to call the office.

## 2011-02-06 ENCOUNTER — Other Ambulatory Visit: Payer: Self-pay | Admitting: Obstetrics and Gynecology

## 2011-02-06 DIAGNOSIS — Z1231 Encounter for screening mammogram for malignant neoplasm of breast: Secondary | ICD-10-CM

## 2011-02-24 ENCOUNTER — Ambulatory Visit (INDEPENDENT_AMBULATORY_CARE_PROVIDER_SITE_OTHER): Payer: BC Managed Care – PPO | Admitting: Internal Medicine

## 2011-02-24 ENCOUNTER — Encounter: Payer: Self-pay | Admitting: Internal Medicine

## 2011-02-24 VITALS — BP 98/60 | HR 92 | Ht 66.0 in | Wt 127.0 lb

## 2011-02-24 DIAGNOSIS — R1319 Other dysphagia: Secondary | ICD-10-CM

## 2011-02-24 DIAGNOSIS — R1310 Dysphagia, unspecified: Secondary | ICD-10-CM

## 2011-02-24 HISTORY — DX: Dysphagia, unspecified: R13.10

## 2011-02-24 MED ORDER — DEXLANSOPRAZOLE 60 MG PO CPDR: 60.0000 mg | DELAYED_RELEASE_CAPSULE | Freq: Every day | ORAL | Status: DC

## 2011-02-24 NOTE — Assessment & Plan Note (Signed)
Having symptoms that could be due to this but ? If she could be having spasm

## 2011-02-24 NOTE — Progress Notes (Signed)
  Subjective:    Patient ID: [name] Case, female    DOB: 1960-11-22, 50 y.o.   MRN: [id number]  HPI 50 yo ww known to me with history of GERD and mild stricture, dysphagia without stricture and pill esophagitis. She is having problems again after a long period of doing well, since 2010 esophageal dilation.  Infrequent but increasing frequency of dysphagia to solids with a suprasternal sticking point. Has intense spells of chest burning intermittently about twice a month. Extra H2 blocker or Tums no help. Diet unchanged. No significant caffeine ingestion. Review of Systems As per HPI    Objective:   Physical Exam  Constitutional: She appears well-developed and well-nourished.  Neck: Normal range of motion. Neck supple. No tracheal deviation present. No thyromegaly present.       No Elroy adenopathy No mass  Cardiovascular: Normal rate and regular rhythm.   Pulmonary/Chest: Effort normal and breath sounds normal.  Lymphadenopathy:    She has no cervical adenopathy.  Psychiatric: She has a normal mood and affect.          Assessment & Plan:

## 2011-02-24 NOTE — Assessment & Plan Note (Signed)
Recurrent issue. Responded to esophageal dilation in past though no stricture. ? Motility issue - check ba swallow. May need another EGD - would biopsy esophagus if so in addition to possible dilation. ? If she could have eosinophilic esophagitis though does not have classic risk factors. She could also be having tachyphylaxis to pantoprazole so will try samples of Dexilant.

## 2011-02-24 NOTE — Patient Instructions (Signed)
You have been scheduled for a Barium Swallow with tablet at [facility] on Monday July 16 at 10:00 am arrive at 9:45 am  At the Radiology Department. You have been given samples of Dexilant 60 mg take 1 tablet every morning before breakfast. Hold the Pantoprazole.

## 2011-02-27 ENCOUNTER — Ambulatory Visit
Admission: RE | Admit: 2011-02-27 | Discharge: 2011-02-27 | Disposition: A | Discharge status: Home / Self Care | Payer: BC Managed Care – PPO | Source: Ambulatory Visit | Attending: Obstetrics and Gynecology | Admitting: Obstetrics and Gynecology

## 2011-02-27 ENCOUNTER — Ambulatory Visit (HOSPITAL_COMMUNITY)
Admission: RE | Admit: 2011-02-27 | Discharge: 2011-02-27 | Disposition: A | Discharge status: Home / Self Care | Payer: BC Managed Care – PPO | Source: Ambulatory Visit | Attending: Internal Medicine | Admitting: Internal Medicine

## 2011-02-27 DIAGNOSIS — R1319 Other dysphagia: Secondary | ICD-10-CM

## 2011-02-27 DIAGNOSIS — R1310 Dysphagia, unspecified: Secondary | ICD-10-CM | POA: Insufficient documentation

## 2011-02-27 DIAGNOSIS — Z1231 Encounter for screening mammogram for malignant neoplasm of breast: Secondary | ICD-10-CM

## 2011-02-28 ENCOUNTER — Telehealth: Payer: Self-pay | Admitting: *Deleted

## 2011-02-28 NOTE — Telephone Encounter (Signed)
Patient given results. She wants to continue trying the Dexilant for awhile before arranging an EGD.

## 2011-02-28 NOTE — Telephone Encounter (Signed)
ok 

## 2011-02-28 NOTE — Telephone Encounter (Signed)
Message copied by Daphine Deutscher on Tue Feb 28, 2011  4:41 PM ------      Message from: Iva Boop      Created: Tue Feb 28, 2011  4:22 PM       This looks ok       Unless she is doing better on the Dexilant - can give it some more time - would arrange EGD and dilation - [facility] is ok

## 2011-02-28 NOTE — Progress Notes (Signed)
Quick Note:  This looks ok  Unless she is doing better on the Dexilant - can give it some more time - would arrange EGD and dilation - [facility] is ok  ______

## 2011-03-06 ENCOUNTER — Other Ambulatory Visit: Payer: Self-pay | Admitting: Internal Medicine

## 2011-03-06 ENCOUNTER — Other Ambulatory Visit (HOSPITAL_COMMUNITY)
Admission: RE | Admit: 2011-03-06 | Discharge: 2011-03-06 | Disposition: A | Discharge status: Home / Self Care | Payer: BC Managed Care – PPO | Source: Ambulatory Visit | Attending: Obstetrics and Gynecology | Admitting: Obstetrics and Gynecology

## 2011-03-06 ENCOUNTER — Other Ambulatory Visit: Payer: Self-pay | Admitting: Obstetrics and Gynecology

## 2011-03-06 DIAGNOSIS — Z01419 Encounter for gynecological examination (general) (routine) without abnormal findings: Secondary | ICD-10-CM | POA: Insufficient documentation

## 2011-03-06 MED ORDER — DEXLANSOPRAZOLE 60 MG PO CPDR: 60.0000 mg | DELAYED_RELEASE_CAPSULE | Freq: Every day | ORAL | Status: DC

## 2011-03-06 NOTE — Telephone Encounter (Signed)
Medication for Dexilant 60 mg #30 with 11 refills sent to [facility].

## 2011-06-05 ENCOUNTER — Encounter: Payer: Self-pay | Admitting: Internal Medicine

## 2011-06-05 ENCOUNTER — Ambulatory Visit (INDEPENDENT_AMBULATORY_CARE_PROVIDER_SITE_OTHER): Payer: BC Managed Care – PPO | Admitting: Internal Medicine

## 2011-06-05 DIAGNOSIS — F411 Generalized anxiety disorder: Secondary | ICD-10-CM

## 2011-06-05 DIAGNOSIS — E039 Hypothyroidism, unspecified: Secondary | ICD-10-CM

## 2011-06-05 DIAGNOSIS — F419 Anxiety disorder, unspecified: Secondary | ICD-10-CM

## 2011-06-05 LAB — TSH: TSH: 5.43 u[IU]/mL (ref 0.35–5.50)

## 2011-06-05 NOTE — Progress Notes (Signed)
  Subjective:    Patient ID: [name] Case, female    DOB: 1961/08/04, 50 y.o.   MRN: [id number]  HPI  F/u anxiety Tolerating meds Mother killed in MVA: auto vs pedestrian. [name] Case is handling appropriately  Past Medical History  Diagnosis Date  . GERD 07/18/2007  . HYPOTHYROIDISM 07/18/2007  . MITRAL VALVE PROLAPSE 07/18/2007  . 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 Pill esophagitis 2007  . Disorder of sebaceous glands     sebaceous neoplasm   Past Surgical History  Procedure Date  . Appendectomy   . Colonoscopy 12/23/2003    Normal  . Upper gastrointestinal endoscopy 02/08/2009    inflammatory nodule in antrum, 50 Fr Maloney dilation  . Upper gastrointestinal endoscopy 2007x2    pill esophagitis/ulcer  . Upper gastrointestinal endoscopy 2004    erosive esophagitis and mild esophageal stricture - dilated    reports that she has never smoked. She does not have any smokeless tobacco history on file. She reports that she drinks alcohol. She reports that she does not use illicit drugs. family history includes Hypertension in her brother.  There is no history of Colon cancer. Allergies  Allergen Reactions  . Sulfamethoxazole     REACTION: hives  . Tetracycline Hcl     REACTION: ulcers in throat     Review of Systems  patient denies chest pain, shortness of breath, orthopnea. Denies lower extremity edema, abdominal pain, change in appetite, change in bowel movements. Patient denies rashes, musculoskeletal complaints. No other specific complaints in a complete review of systems.      Objective:   Physical Exam  Well-developed well-nourished female in no acute distress. HEENT exam atraumatic, normocephalic, extraocular muscles are intact. Neck is supple. No jugular venous distention no thyromegaly. Chest clear to auscultation without increased work of breathing. Cardiac exam S1 and S2 are regular. Abdominal exam active bowel sounds, soft, nontender. Extremities no  edema. Neurologic exam she is alert without any motor sensory deficits. Gait is normal.        Assessment & Plan:

## 2011-06-05 NOTE — Assessment & Plan Note (Signed)
Doing well on meds Obviously mood has been challenged by recent death of mother.

## 2011-06-05 NOTE — Assessment & Plan Note (Signed)
Check labs today.

## 2011-07-04 ENCOUNTER — Other Ambulatory Visit (HOSPITAL_COMMUNITY): Payer: Self-pay | Admitting: *Deleted

## 2011-07-05 ENCOUNTER — Telehealth: Payer: Self-pay | Admitting: *Deleted

## 2011-07-05 MED ORDER — CIPROFLOXACIN HCL 250 MG PO TABS: 250.0000 mg | ORAL_TABLET | Freq: Two times a day (BID) | ORAL | Status: AC

## 2011-07-05 NOTE — Telephone Encounter (Signed)
cipro 250 mg po bid for 3 days

## 2011-07-05 NOTE — Telephone Encounter (Signed)
Dr Cato Mulligan is not in the office this afternoon.  I offered an appt with another physician and she choose to go to Urgent Care

## 2011-07-05 NOTE — Telephone Encounter (Signed)
Pt. Wants to have Dr. Cato Mulligan treat her for an UTI, please.

## 2011-07-05 NOTE — Telephone Encounter (Signed)
Addended by: Noralee Stain D on: 07/05/2011 04:02 PM   Modules accepted: Orders

## 2011-07-05 NOTE — Telephone Encounter (Signed)
Notified pt. 

## 2011-09-13 ENCOUNTER — Ambulatory Visit (INDEPENDENT_AMBULATORY_CARE_PROVIDER_SITE_OTHER): Payer: BC Managed Care – PPO | Admitting: Internal Medicine

## 2011-09-13 VITALS — BP 120/78 | Temp 98.1°F | Wt 125.0 lb

## 2011-09-13 DIAGNOSIS — F411 Generalized anxiety disorder: Secondary | ICD-10-CM

## 2011-09-13 DIAGNOSIS — F419 Anxiety disorder, unspecified: Secondary | ICD-10-CM

## 2011-09-13 DIAGNOSIS — J40 Bronchitis, not specified as acute or chronic: Secondary | ICD-10-CM

## 2011-09-13 MED ORDER — AZITHROMYCIN 250 MG PO TABS: ORAL_TABLET | ORAL | Status: AC

## 2011-09-13 NOTE — Assessment & Plan Note (Addendum)
Doing much better on zoloft She has noticed that sxs are worsening She would like to see psychologist---she does admit to crying a lot.

## 2011-09-13 NOTE — Progress Notes (Signed)
Patient ID: [name] Case, female   DOB: 1961/06/18, 51 y.o.   MRN: [id number] 3 week hx of uri sxs---congested Now with cough and productive cough--no fever or chills. Cough can be severe. No wheeze no fever  Past Medical History  Diagnosis Date  . GERD 07/18/2007  . HYPOTHYROIDISM 07/18/2007  . MITRAL VALVE PROLAPSE 07/18/2007  . 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 Pill esophagitis 2007  . Disorder of sebaceous glands     sebaceous neoplasm    History   Social History  . Marital Status: Married    Spouse Name: N/A    Number of Children: 2  . 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 Smoking status: Never Smoker   . Smokeless tobacco: Not on file  . Alcohol Use: Yes  . Drug Use: No  . Sexually Active:    Other Topics Concern  . Not on file   Social History Narrative  . No narrative on file    Past Surgical History  Procedure Date  . Appendectomy   . Colonoscopy 12/23/2003    Normal  . Upper gastrointestinal endoscopy 02/08/2009    inflammatory nodule in antrum, 50 Fr Maloney dilation  . Upper gastrointestinal endoscopy 2007x2    pill esophagitis/ulcer  . Upper gastrointestinal endoscopy 2004    erosive esophagitis and mild esophageal stricture - dilated    Family History  Problem Relation Age of Onset  . Hypertension Brother   . Colon cancer Neg Hx     Allergies  Allergen Reactions  . Sulfamethoxazole     REACTION: hives  . Tetracycline Hcl     REACTION: ulcers in throat    Current Outpatient Prescriptions on File Prior to Visit  Medication Sig Dispense Refill  . Calcium Carbonate-Vitamin D (CALTRATE 600+D) 600-400 MG-UNIT per tablet Take 1 tablet by mouth daily.        Marland Kitchen DEXILANT 60 MG capsule Take 1 tablet by mouth daily.      Marland Kitchen levothyroxine (SYNTHROID) 125 MCG tablet Take 1 tablet (125 mcg total) by mouth daily.  90 tablet  3  . sertraline (ZOLOFT) 100 MG tablet Take 1  tablet (100 mg total) by mouth daily.  90 tablet  3     patient denies chest pain, shortness of breath, orthopnea. Denies lower extremity edema, abdominal pain, change in appetite, change in bowel movements. Patient denies rashes, musculoskeletal complaints. No other specific complaints in a complete review of systems.   BP 120/78  Temp(Src) 98.1 F (36.7 C) (Oral)  Wt 125 lb (56.7 kg)  Well-developed well-nourished female in no acute distress. HEENT exam atraumatic, normocephalic, extraocular muscles are intact. Neck is supple. No jugular venous distention no thyromegaly. Chest clear to auscultation without increased work of breathing. Cardiac exam S1 and S2 are regular. Abdominal exam active bowel sounds, soft, nontender. Extremities no edema.   A/P--- Bronchitis--given duration--trial abx

## 2011-11-24 ENCOUNTER — Other Ambulatory Visit: Payer: Self-pay | Admitting: *Deleted

## 2011-11-24 MED ORDER — LEVOTHYROXINE SODIUM 125 MCG PO TABS: 125.0000 ug | ORAL_TABLET | Freq: Every day | ORAL | Status: DC

## 2012-02-21 ENCOUNTER — Other Ambulatory Visit: Payer: Self-pay | Admitting: Obstetrics and Gynecology

## 2012-02-21 DIAGNOSIS — Z1231 Encounter for screening mammogram for malignant neoplasm of breast: Secondary | ICD-10-CM

## 2012-03-07 ENCOUNTER — Ambulatory Visit
Admission: RE | Admit: 2012-03-07 | Discharge: 2012-03-07 | Disposition: A | Discharge status: Home / Self Care | Payer: BC Managed Care – PPO | Source: Ambulatory Visit | Attending: Obstetrics and Gynecology | Admitting: Obstetrics and Gynecology

## 2012-03-07 DIAGNOSIS — Z1231 Encounter for screening mammogram for malignant neoplasm of breast: Secondary | ICD-10-CM

## 2012-03-13 ENCOUNTER — Other Ambulatory Visit (INDEPENDENT_AMBULATORY_CARE_PROVIDER_SITE_OTHER): Payer: BC Managed Care – PPO

## 2012-03-13 DIAGNOSIS — Z0000 Encounter for general adult medical examination without abnormal findings: Secondary | ICD-10-CM

## 2012-03-13 LAB — CBC WITH DIFFERENTIAL/PLATELET
Basophils Absolute: 0.1 10*3/uL (ref 0.0–0.1)
Basophils Relative: 1.1 % (ref 0.0–3.0)
Eosinophils Absolute: 0.1 10*3/uL (ref 0.0–0.7)
Eosinophils Relative: 1.4 % (ref 0.0–5.0)
HCT: 33.5 % — ABNORMAL LOW (ref 36.0–46.0)
Hemoglobin: 11.1 g/dL — ABNORMAL LOW (ref 12.0–15.0)
Lymphocytes Relative: 29.3 % (ref 12.0–46.0)
Lymphs Abs: 1.4 10*3/uL (ref 0.7–4.0)
MCHC: 33.3 g/dL (ref 30.0–36.0)
MCV: 87.9 fl (ref 78.0–100.0)
Monocytes Absolute: 0.4 10*3/uL (ref 0.1–1.0)
Monocytes Relative: 8.9 % (ref 3.0–12.0)
Neutro Abs: 2.7 10*3/uL (ref 1.4–7.7)
Neutrophils Relative %: 59.3 % (ref 43.0–77.0)
Platelets: 289 10*3/uL (ref 150.0–400.0)
RBC: 3.81 Mil/uL — ABNORMAL LOW (ref 3.87–5.11)
RDW: 16.6 % — ABNORMAL HIGH (ref 11.5–14.6)
WBC: 4.6 10*3/uL (ref 4.5–10.5)

## 2012-03-13 LAB — HEPATIC FUNCTION PANEL
ALT: 19 U/L (ref 0–35)
AST: 24 U/L (ref 0–37)
Albumin: 4.1 g/dL (ref 3.5–5.2)
Alkaline Phosphatase: 57 U/L (ref 39–117)
Bilirubin, Direct: 0 mg/dL (ref 0.0–0.3)
Total Bilirubin: 0.7 mg/dL (ref 0.3–1.2)
Total Protein: 7.5 g/dL (ref 6.0–8.3)

## 2012-03-13 LAB — POCT URINALYSIS DIPSTICK
Bilirubin, UA: NEGATIVE
Glucose, UA: NEGATIVE
Ketones, UA: NEGATIVE
Leukocytes, UA: NEGATIVE
Nitrite, UA: NEGATIVE
Protein, UA: NEGATIVE
Spec Grav, UA: 1.025
Urobilinogen, UA: 0.2
pH, UA: 5.5

## 2012-03-13 LAB — BASIC METABOLIC PANEL
BUN: 16 mg/dL (ref 6–23)
CO2: 27 mEq/L (ref 19–32)
Calcium: 9.2 mg/dL (ref 8.4–10.5)
Chloride: 101 mEq/L (ref 96–112)
Creatinine, Ser: 0.7 mg/dL (ref 0.4–1.2)
GFR: 95.79 mL/min (ref >60.00)
Glucose, Bld: 86 mg/dL (ref 70–99)
Potassium: 4.1 mEq/L (ref 3.5–5.1)
Sodium: 136 mEq/L (ref 135–145)

## 2012-03-13 LAB — LIPID PANEL
Cholesterol: 280 mg/dL — ABNORMAL HIGH (ref 0–200)
HDL: 131.4 mg/dL (ref >39.00)
Total CHOL/HDL Ratio: 2
Triglycerides: 68 mg/dL (ref 0.0–149.0)
VLDL: 13.6 mg/dL (ref 0.0–40.0)

## 2012-03-13 LAB — TSH: TSH: 6.81 u[IU]/mL — ABNORMAL HIGH (ref 0.35–5.50)

## 2012-03-13 LAB — LDL CHOLESTEROL, DIRECT: Direct LDL: 130.9 mg/dL

## 2012-03-18 ENCOUNTER — Other Ambulatory Visit: Payer: Self-pay | Admitting: Internal Medicine

## 2012-03-20 ENCOUNTER — Ambulatory Visit (INDEPENDENT_AMBULATORY_CARE_PROVIDER_SITE_OTHER): Payer: BC Managed Care – PPO | Admitting: Internal Medicine

## 2012-03-20 ENCOUNTER — Encounter: Payer: Self-pay | Admitting: Internal Medicine

## 2012-03-20 VITALS — BP 120/72 | HR 64 | Temp 98.2°F | Ht 66.25 in | Wt 126.0 lb

## 2012-03-20 DIAGNOSIS — Z0000 Encounter for general adult medical examination without abnormal findings: Secondary | ICD-10-CM

## 2012-03-20 DIAGNOSIS — R350 Frequency of micturition: Secondary | ICD-10-CM

## 2012-03-20 DIAGNOSIS — D649 Anemia, unspecified: Secondary | ICD-10-CM

## 2012-03-20 LAB — POCT URINALYSIS DIPSTICK
Bilirubin, UA: NEGATIVE
Blood, UA: NEGATIVE
Glucose, UA: NEGATIVE
Ketones, UA: NEGATIVE
Leukocytes, UA: NEGATIVE
Nitrite, UA: NEGATIVE
Protein, UA: NEGATIVE
Spec Grav, UA: 1.01
Urobilinogen, UA: 0.2
pH, UA: 5.5

## 2012-03-20 LAB — IBC PANEL
Iron: 52 ug/dL (ref 42–145)
Saturation Ratios: 9.8 % — ABNORMAL LOW (ref 20.0–50.0)
Transferrin: 378.4 mg/dL — ABNORMAL HIGH (ref 212.0–360.0)

## 2012-03-20 LAB — IRON: Iron: 52 ug/dL (ref 42–145)

## 2012-03-20 LAB — VITAMIN B12: Vitamin B-12: 139 pg/mL — ABNORMAL LOW (ref 211–911)

## 2012-03-20 LAB — FERRITIN: Ferritin: 6.9 ng/mL — ABNORMAL LOW (ref 10.0–291.0)

## 2012-03-20 NOTE — Progress Notes (Signed)
Patient ID: [name] Case, female   DOB: October 23, 1960, 51 y.o.   MRN: [id number] CPX  Past Medical History  Diagnosis Date  . GERD 07/18/2007  . HYPOTHYROIDISM 07/18/2007  . MITRAL VALVE PROLAPSE 07/18/2007  . 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 Pill esophagitis 2007  . Disorder of sebaceous glands     sebaceous neoplasm    History   Social History  . Marital Status: Married    Spouse Name: N/A    Number of Children: 2  . 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 Smoking status: Never Smoker   . Smokeless tobacco: Not on file  . Alcohol Use: Yes  . Drug Use: No  . Sexually Active:    Other Topics Concern  . Not on file   Social History Narrative  . No narrative on file    Past Surgical History  Procedure Date  . Appendectomy   . Colonoscopy 12/23/2003    Normal  . Upper gastrointestinal endoscopy 02/08/2009    inflammatory nodule in antrum, 50 Fr Maloney dilation  . Upper gastrointestinal endoscopy 2007x2    pill esophagitis/ulcer  . Upper gastrointestinal endoscopy 2004    erosive esophagitis and mild esophageal stricture - dilated    Family History  Problem Relation Age of Onset  . Hypertension Brother   . Colon cancer Neg Hx     Allergies  Allergen Reactions  . Sulfamethoxazole     REACTION: hives  . Tetracycline Hcl     REACTION: ulcers in throat    Current Outpatient Prescriptions on File Prior to Visit  Medication Sig Dispense Refill  . Calcium Carbonate-Vitamin D (CALTRATE 600+D) 600-400 MG-UNIT per tablet Take 1 tablet by mouth daily.        Marland Kitchen DEXILANT 60 MG capsule TAKE ONE CAPSULE BY MOUTH EVERY DAY  30 capsule  10  . levothyroxine (SYNTHROID, LEVOTHROID) 125 MCG tablet Take 1 tablet (125 mcg total) by mouth daily.  90 tablet  3  .      .      .         patient denies chest pain, shortness of breath, orthopnea. Denies lower extremity edema, abdominal pain, change in  appetite, change in bowel movements. Patient denies rashes, musculoskeletal complaints. No other specific complaints in a complete review of systems.   BP 120/72  Pulse 64  Temp 98.2 F (36.8 C) (Oral)  Ht 5' 6.25" (1.683 m)  Wt 126 lb (57.153 kg)  BMI 20.18 kg/m2  LMP 03/05/2012  Well-developed well-nourished female in no acute distress. HEENT exam atraumatic, normocephalic, extraocular muscles are intact. Neck is supple. No jugular venous distention no thyromegaly. Chest clear to auscultation without increased work of breathing. Cardiac exam S1 and S2 are regular. Abdominal exam active bowel sounds, soft, nontender. Extremities no edema. Neurologic exam she is alert without any motor sensory deficits. Gait is normal.   Well Visit: health maint UTD Mood- tolerating meds and seeing psychology

## 2012-03-22 ENCOUNTER — Other Ambulatory Visit: Payer: Self-pay | Admitting: *Deleted

## 2012-03-22 LAB — URINE CULTURE: Colony Count: 1000

## 2012-03-22 MED ORDER — FERROUS SULFATE 325 (65 FE) MG PO TABS: 325.0000 mg | ORAL_TABLET | Freq: Two times a day (BID) | ORAL | Status: DC

## 2012-03-29 ENCOUNTER — Ambulatory Visit: Payer: BC Managed Care – PPO | Admitting: Internal Medicine

## 2012-04-01 ENCOUNTER — Ambulatory Visit (INDEPENDENT_AMBULATORY_CARE_PROVIDER_SITE_OTHER): Payer: BC Managed Care – PPO | Admitting: Internal Medicine

## 2012-04-01 DIAGNOSIS — E611 Iron deficiency: Secondary | ICD-10-CM

## 2012-04-01 DIAGNOSIS — E538 Deficiency of other specified B group vitamins: Secondary | ICD-10-CM

## 2012-04-01 MED ORDER — CYANOCOBALAMIN 1000 MCG/ML IJ SOLN
1000.0000 ug | Freq: Once | INTRAMUSCULAR | Status: AC
  Administered 2012-04-01: 1000 ug via INTRAMUSCULAR

## 2012-04-02 LAB — CELIAC PANEL 10
Endomysial Screen: NEGATIVE
Gliadin IgA: 4.2 U/mL (ref <20)
Gliadin IgG: 4.5 U/mL (ref <20)
IgA: 149 mg/dL (ref 69–380)
Tissue Transglut Ab: 4.5 U/mL (ref <20)
Tissue Transglutaminase Ab, IgA: 3.3 U/mL (ref <20)

## 2012-04-04 NOTE — Progress Notes (Signed)
Left message on machine normal 

## 2012-04-29 ENCOUNTER — Encounter: Payer: Self-pay | Admitting: Internal Medicine

## 2012-05-02 ENCOUNTER — Ambulatory Visit (INDEPENDENT_AMBULATORY_CARE_PROVIDER_SITE_OTHER): Payer: BC Managed Care – PPO | Admitting: *Deleted

## 2012-05-02 DIAGNOSIS — D649 Anemia, unspecified: Secondary | ICD-10-CM

## 2012-05-02 MED ORDER — CYANOCOBALAMIN 1000 MCG/ML IJ SOLN
1000.0000 ug | INTRAMUSCULAR | Status: DC
  Administered 2012-05-02: 1000 ug via INTRAMUSCULAR

## 2012-05-06 ENCOUNTER — Encounter: Payer: Self-pay | Admitting: Internal Medicine

## 2012-05-23 ENCOUNTER — Ambulatory Visit (INDEPENDENT_AMBULATORY_CARE_PROVIDER_SITE_OTHER): Payer: BC Managed Care – PPO | Admitting: Internal Medicine

## 2012-05-23 DIAGNOSIS — E538 Deficiency of other specified B group vitamins: Secondary | ICD-10-CM

## 2012-05-23 MED ORDER — CYANOCOBALAMIN 1000 MCG/ML IJ SOLN
1000.0000 ug | Freq: Once | INTRAMUSCULAR | Status: AC
  Administered 2012-05-23: 1000 ug via INTRAMUSCULAR

## 2012-06-11 ENCOUNTER — Other Ambulatory Visit: Payer: Self-pay

## 2012-06-11 MED ORDER — DEXLANSOPRAZOLE 60 MG PO CPDR: 60.0000 mg | DELAYED_RELEASE_CAPSULE | Freq: Every day | ORAL | Status: DC

## 2012-06-20 ENCOUNTER — Ambulatory Visit (INDEPENDENT_AMBULATORY_CARE_PROVIDER_SITE_OTHER): Payer: BC Managed Care – PPO | Admitting: Internal Medicine

## 2012-06-20 DIAGNOSIS — E538 Deficiency of other specified B group vitamins: Secondary | ICD-10-CM

## 2012-06-20 MED ORDER — CYANOCOBALAMIN 1000 MCG/ML IJ SOLN
1000.0000 ug | INTRAMUSCULAR | Status: DC
  Administered 2012-06-20: 1000 ug via INTRAMUSCULAR

## 2012-07-12 ENCOUNTER — Ambulatory Visit (INDEPENDENT_AMBULATORY_CARE_PROVIDER_SITE_OTHER): Payer: BC Managed Care – PPO | Admitting: Internal Medicine

## 2012-07-12 DIAGNOSIS — E538 Deficiency of other specified B group vitamins: Secondary | ICD-10-CM

## 2012-07-12 MED ORDER — CYANOCOBALAMIN 1000 MCG/ML IJ SOLN
1000.0000 ug | Freq: Once | INTRAMUSCULAR | Status: AC
  Administered 2012-07-12: 1000 ug via INTRAMUSCULAR

## 2012-08-02 ENCOUNTER — Ambulatory Visit: Payer: BC Managed Care – PPO | Admitting: Internal Medicine

## 2012-08-02 DIAGNOSIS — Z0289 Encounter for other administrative examinations: Secondary | ICD-10-CM

## 2012-09-13 ENCOUNTER — Ambulatory Visit: Payer: BC Managed Care – PPO | Admitting: Internal Medicine

## 2012-09-13 ENCOUNTER — Ambulatory Visit (INDEPENDENT_AMBULATORY_CARE_PROVIDER_SITE_OTHER): Payer: BC Managed Care – PPO | Admitting: *Deleted

## 2012-09-13 DIAGNOSIS — E538 Deficiency of other specified B group vitamins: Secondary | ICD-10-CM

## 2012-09-13 MED ORDER — CYANOCOBALAMIN 1000 MCG/ML IJ SOLN
1000.0000 ug | Freq: Once | INTRAMUSCULAR | Status: AC
  Administered 2012-09-13: 1000 ug via INTRAMUSCULAR

## 2012-10-29 ENCOUNTER — Encounter: Payer: Self-pay | Admitting: Internal Medicine

## 2012-10-30 ENCOUNTER — Encounter: Payer: Self-pay | Admitting: Internal Medicine

## 2012-11-13 ENCOUNTER — Other Ambulatory Visit: Payer: Self-pay | Admitting: *Deleted

## 2012-11-13 MED ORDER — LEVOTHYROXINE SODIUM 125 MCG PO TABS: 125.0000 ug | ORAL_TABLET | Freq: Every day | ORAL | Status: DC

## 2012-11-18 ENCOUNTER — Other Ambulatory Visit (HOSPITAL_COMMUNITY)
Admission: RE | Admit: 2012-11-18 | Discharge: 2012-11-18 | Disposition: A | Discharge status: Home / Self Care | Payer: BC Managed Care – PPO | Source: Ambulatory Visit | Attending: Obstetrics and Gynecology | Admitting: Obstetrics and Gynecology

## 2012-11-18 ENCOUNTER — Other Ambulatory Visit: Payer: Self-pay | Admitting: Obstetrics and Gynecology

## 2012-11-18 DIAGNOSIS — Z1151 Encounter for screening for human papillomavirus (HPV): Secondary | ICD-10-CM | POA: Insufficient documentation

## 2012-11-18 DIAGNOSIS — Z01419 Encounter for gynecological examination (general) (routine) without abnormal findings: Secondary | ICD-10-CM | POA: Insufficient documentation

## 2012-12-13 ENCOUNTER — Ambulatory Visit (AMBULATORY_SURGERY_CENTER): Payer: BC Managed Care – PPO | Admitting: *Deleted

## 2012-12-13 ENCOUNTER — Encounter: Payer: Self-pay | Admitting: Internal Medicine

## 2012-12-13 VITALS — Ht 66.0 in | Wt 136.2 lb

## 2012-12-13 DIAGNOSIS — Z1211 Encounter for screening for malignant neoplasm of colon: Secondary | ICD-10-CM

## 2012-12-13 MED ORDER — NA SULFATE-K SULFATE-MG SULF 17.5-3.13-1.6 GM/177ML PO SOLN: ORAL | Status: DC

## 2012-12-27 ENCOUNTER — Ambulatory Visit (AMBULATORY_SURGERY_CENTER): Payer: BC Managed Care – PPO | Admitting: Internal Medicine

## 2012-12-27 ENCOUNTER — Encounter: Payer: Self-pay | Admitting: Internal Medicine

## 2012-12-27 VITALS — BP 124/77 | HR 62 | Temp 97.5°F | Resp 13 | Ht 66.0 in | Wt 136.0 lb

## 2012-12-27 DIAGNOSIS — Z1211 Encounter for screening for malignant neoplasm of colon: Secondary | ICD-10-CM

## 2012-12-27 MED ORDER — SODIUM CHLORIDE 0.9 % IV SOLN: 500.0000 mL | INTRAVENOUS | Status: DC

## 2012-12-27 NOTE — Patient Instructions (Addendum)
The colonoscopy was normal.  Next routine colonoscopy in 10 years! 2024   I appreciate the opportunity to care for you. Carl E. Gessner, MD, FACG  YOU HAD AN ENDOSCOPIC PROCEDURE TODAY AT THE [facility]: Refer to the procedure report that was given to you for any specific questions about what was found during the examination.  If the procedure report does not answer your questions, please call your gastroenterologist to clarify.  If you requested that your care partner not be given the details of your procedure findings, then the procedure report has been included in a sealed envelope for you to review at your convenience later.  YOU SHOULD EXPECT: Some feelings of bloating in the abdomen. Passage of more gas than usual.  Walking can help get rid of the air that was put into your GI tract during the procedure and reduce the bloating. If you had a lower endoscopy (such as a colonoscopy or flexible sigmoidoscopy) you may notice spotting of blood in your stool or on the toilet paper. If you underwent a bowel prep for your procedure, then you may not have a normal bowel movement for a few days.  DIET: Your first meal following the procedure should be a light meal and then it is ok to progress to your normal diet.  A half-sandwich or bowl of soup is an example of a good first meal.  Heavy or fried foods are harder to digest and may make you feel nauseous or bloated.  Likewise meals heavy in dairy and vegetables can cause extra gas to form and this can also increase the bloating.  Drink plenty of fluids but you should avoid alcoholic beverages for 24 hours.  ACTIVITY: Your care partner should take you home directly after the procedure.  You should plan to take it easy, moving slowly for the rest of the day.  You can resume normal activity the day after the procedure however you should NOT DRIVE or use heavy machinery for 24 hours (because of the sedation medicines used during the test).     SYMPTOMS TO REPORT IMMEDIATELY: A gastroenterologist can be reached at any hour.  During normal business hours, 8:30 AM to 5:00 PM Monday through Friday, call [phone].  After hours and on weekends, please call the GI answering service at [phone] who will take a message and have the physician on call contact you.   Following lower endoscopy (colonoscopy or flexible sigmoidoscopy):  Excessive amounts of blood in the stool  Significant tenderness or worsening of abdominal pains  Swelling of the abdomen that is new, acute  Fever of 100F or higher  FOLLOW UP: If any biopsies were taken you will be contacted by phone or by letter within the next 1-3 weeks.  Call your gastroenterologist if you have not heard about the biopsies in 3 weeks.  Our staff will call the home number listed on your records the next business day following your procedure to check on you and address any questions or concerns that you may have at that time regarding the information given to you following your procedure. This is a courtesy call and so if there is no answer at the home number and we have not heard from you through the emergency physician on call, we will assume that you have returned to your regular daily activities without incident.  SIGNATURES/CONFIDENTIALITY: You and/or your care partner have signed paperwork which will be entered into your electronic medical record.  These signatures   attest to the fact that that the information above on your After Visit Summary has been reviewed and is understood.  Full responsibility of the confidentiality of this discharge information lies with you and/or your care-partner.    

## 2012-12-27 NOTE — Progress Notes (Signed)
Patient did not experience any of the following events: a burn prior to discharge; a fall within the facility; wrong site/side/patient/procedure/implant event; or a hospital transfer or hospital admission upon discharge from the facility. (G8907)Patient did not experience any of the following events: a burn prior to discharge; a fall within the facility; wrong site/side/patient/procedure/implant event; or a hospital transfer or hospital admission upon discharge from the facility. (G8907)Patient with preoperative order for IV antibiotic SSI prophylaxis, antibiotic initiated on time. (G8916) 

## 2012-12-27 NOTE — Op Note (Signed)
[facility] [address] [place], [address]   COLONOSCOPY PROCEDURE REPORT  PATIENT: [name] Case, [name] Case  MR#: [id number] BIRTHDATE: 31-Aug-1960 , 51  yrs. old GENDER: Female ENDOSCOPIST: Iva Boop, MD, [facility] PROCEDURE DATE:  12/27/2012 PROCEDURE:   Colonoscopy, screening ASA CLASS:   Class II INDICATIONS:average risk screening. MEDICATIONS: propofol (Diprivan) 200mg  IV, MAC sedation, administered by CRNA, and These medications were titrated to patient response per physician's verbal order  DESCRIPTION OF PROCEDURE:   After the risks benefits and alternatives of the procedure were thoroughly explained, informed consent was obtained.  A digital rectal exam revealed no abnormalities of the rectum.   The LB [id number]  endoscope was introduced through the anus and advanced to the cecum, which was identified by both the appendix and ileocecal valve. No adverse events experienced.   The quality of the prep was Suprep good  The instrument was then slowly withdrawn as the colon was fully examined.      COLON FINDINGS: A normal appearing cecum, ileocecal valve, and appendiceal orifice were identified.  The ascending, hepatic flexure, transverse, splenic flexure, descending, sigmoid colon and rectum appeared unremarkable.  No polyps or cancers were seen.   A right colon retroflexion was performed.  Retroflexed views revealed no abnormalities. The time to cecum=2 minutes 42 seconds. Withdrawal time=9 minutes 41 seconds.  The scope was withdrawn and the procedure completed. COMPLICATIONS: There were no complications.  ENDOSCOPIC IMPRESSION: Normal colonoscopy with a good prep  RECOMMENDATIONS: Repeat Colonscopy in 10 years - 2024   eSigned:  Iva Boop, MD, [facility] 12/27/2012 11:54 AM   cc: The Patient

## 2012-12-30 ENCOUNTER — Telehealth: Payer: Self-pay | Admitting: *Deleted

## 2012-12-30 NOTE — Telephone Encounter (Signed)
  Follow up Call-  Call back number 12/27/2012  Post procedure Call Back phone  # [phone]  Permission to leave phone message Yes     Patient questions:  Do you have a fever, pain , or abdominal swelling? no Pain Score  0 *  Have you tolerated food without any problems? yes  Have you been able to return to your normal activities? yes  Do you have any questions about your discharge instructions: Diet   no Medications  no Follow up visit  no  Do you have questions or concerns about your Care? no  Actions: * If pain score is 4 or above: No action needed, pain <4.

## 2013-03-21 ENCOUNTER — Telehealth: Payer: Self-pay | Admitting: Internal Medicine

## 2013-03-21 NOTE — Telephone Encounter (Signed)
I agree

## 2013-03-21 NOTE — Telephone Encounter (Signed)
Spoke with patient's husband. Patient was fine early this AM then around 10:20 AM, she had abdominal pain and nausea. She ate some eggs to see if this will help. She is taking Dexilant daily. No vomiting that he knows of. Dr. Leone Payor out of office. Scheduled with Mike Gip, PA on 03/24/13 at 8:30 AM. He understands that if she get worsening symptoms over the weekend to seek care at Urgent Care or ED. DOD- Jarold Motto.

## 2013-03-22 ENCOUNTER — Telehealth: Payer: Self-pay | Admitting: Gastroenterology

## 2013-03-22 NOTE — Telephone Encounter (Signed)
On call note at 1010. Pt called yesterday and has appt for Monday. Her chest pain and pain with swallowing are ongoing. Advised to stay on a full liquid diet until Monday. Continue Dexilant. DC Fe for now. Begin Mylanta every 2 hours as needed. Ranitidine BID. Call if symptoms worsen.

## 2013-03-24 ENCOUNTER — Encounter: Payer: Self-pay | Admitting: Physician Assistant

## 2013-03-24 ENCOUNTER — Other Ambulatory Visit (INDEPENDENT_AMBULATORY_CARE_PROVIDER_SITE_OTHER): Payer: BC Managed Care – PPO

## 2013-03-24 ENCOUNTER — Ambulatory Visit (INDEPENDENT_AMBULATORY_CARE_PROVIDER_SITE_OTHER): Payer: BC Managed Care – PPO | Admitting: Physician Assistant

## 2013-03-24 ENCOUNTER — Encounter: Payer: Self-pay | Admitting: Internal Medicine

## 2013-03-24 VITALS — BP 100/62 | HR 58 | Ht 66.0 in | Wt 140.0 lb

## 2013-03-24 DIAGNOSIS — R1310 Dysphagia, unspecified: Secondary | ICD-10-CM

## 2013-03-24 LAB — CBC WITH DIFFERENTIAL/PLATELET
Basophils Absolute: 0 10*3/uL (ref 0.0–0.1)
Basophils Relative: 0.7 % (ref 0.0–3.0)
Eosinophils Absolute: 0.1 10*3/uL (ref 0.0–0.7)
Eosinophils Relative: 1.2 % (ref 0.0–5.0)
HCT: 37.2 % (ref 36.0–46.0)
Hemoglobin: 12.4 g/dL (ref 12.0–15.0)
Lymphocytes Relative: 23.2 % (ref 12.0–46.0)
Lymphs Abs: 1.3 10*3/uL (ref 0.7–4.0)
MCHC: 33.3 g/dL (ref 30.0–36.0)
MCV: 90.4 fl (ref 78.0–100.0)
Monocytes Absolute: 0.6 10*3/uL (ref 0.1–1.0)
Monocytes Relative: 10.9 % (ref 3.0–12.0)
Neutro Abs: 3.5 10*3/uL (ref 1.4–7.7)
Neutrophils Relative %: 64 % (ref 43.0–77.0)
Platelets: 272 10*3/uL (ref 150.0–400.0)
RBC: 4.11 Mil/uL (ref 3.87–5.11)
RDW: 14.7 % — ABNORMAL HIGH (ref 11.5–14.6)
WBC: 5.5 10*3/uL (ref 4.5–10.5)

## 2013-03-24 LAB — IBC PANEL
Iron: 73 ug/dL (ref 42–145)
Saturation Ratios: 15.9 % — ABNORMAL LOW (ref 20.0–50.0)
Transferrin: 327.8 mg/dL (ref 212.0–360.0)

## 2013-03-24 LAB — FERRITIN: Ferritin: 25.3 ng/mL (ref 10.0–291.0)

## 2013-03-24 MED ORDER — SUCRALFATE 1 GM/10ML PO SUSP: 1.0000 g | Freq: Three times a day (TID) | ORAL | Status: DC

## 2013-03-24 NOTE — Progress Notes (Signed)
Subjective:    Patient ID: [name] Case, female    DOB: [birth year], 52 y.o.   MRN: [id number]  HPI [name] Case is a pleasant 60 year old white female known to Dr. Leone Payor. She had colonoscopy done in May of 2014 for screening and now was a normal exam. She has had EGD in May of 2010 for complaints of epigastric discomfort and dysphagia. There was no clear stricture seen but she was [facility] dilated appear clear. She was also found to have a gastric antral nodule which was biopsied and found to be inflammatory, otherwise normal exam. She has been on PPI therapy over the past few years and is currently taking Dexilant 60 mg daily. She says she had been doing well recently and that the Dexilant usually controls her reflux symptoms well She had onset on Friday 87 after drinking coffee that morning with pain in the lower midportion of her chest which was fairly intense and associated with belching. She then developed nausea and dry heaves and says her percent then she has been difficult to difficulty swallowing. She says whenever she eats she eat her food gets to a certain spot and then she gets sharp pain. She called the on-call doctor this weekend who advised she takes Zantac twice daily in addition to the Dexilant, and also start  Mylanta every 2 hours. He says she has been doing that but yesterday still felt miserable. She says she has a sense that food is stopping in her chest and then gradually going on down. She says she's been sleeping elevated and having a difficult time resting. She says this morning finally she feels a bit better was able to eat . oatmeal without any pain. She has not been on any new medications,  Vitamins, antibiotics or anti-inflammatories. She does take B12 orally and iron both of which she stopped this weekend. She has not had any fever chills or other systemic symptoms    Review of Systems  Constitutional: Positive for appetite change. Negative for activity change.  HENT:  Positive for trouble swallowing.   Eyes: Negative.   Respiratory: Negative.   Cardiovascular: Positive for chest pain.  Gastrointestinal: Positive for nausea.  Endocrine: Negative.   Genitourinary: Negative.   Musculoskeletal: Negative.   Skin: Negative.   Allergic/Immunologic: Negative.   Neurological: Negative.   Hematological: Negative.   Psychiatric/Behavioral: Negative.    Outpatient Prescriptions Prior to Visit  Medication Sig Dispense Refill  . cyanocobalamin 2000 MCG tablet Take 2,000 mcg by mouth daily.      Marland Kitchen dexlansoprazole (DEXILANT) 60 MG capsule Take 1 capsule (60 mg total) by mouth daily.  90 capsule  3  . levothyroxine (SYNTHROID, LEVOTHROID) 125 MCG tablet Take 1 tablet (125 mcg total) by mouth daily.  90 tablet  1  . ferrous sulfate 325 (65 FE) MG tablet Take 1 tablet (325 mg total) by mouth 2 (two) times daily.  60 tablet  11  . sertraline (ZOLOFT) 100 MG tablet Take 200 mg by mouth daily.      . Calcium Carbonate-Vitamin D (CALTRATE 600+D) 600-400 MG-UNIT per tablet Take 1 tablet by mouth daily.         No facility-administered medications prior to visit.   Allergies  Allergen Reactions  . Sulfamethoxazole     REACTION: hives  . Tetracycline Hcl     REACTION: ulcers in throat   Patient Active Problem List   Diagnosis Date Noted  . Other dysphagia 02/24/2011  . Anxiety 10/10/2010  . HYPOTHYROIDISM  07/18/2007  . MITRAL VALVE PROLAPSE 07/18/2007  . GERD 07/18/2007   History  Substance Use Topics  . Smoking status: Never Smoker   . Smokeless tobacco: Never Used  . Alcohol Use: 1.8 oz/week    3 Glasses of wine per week    family history includes Hypertension in her brother.  There is no history of Colon cancer.     Objective:   Physical Exam  well-developed white female in no acute distress, pleasant blood pressure 100/62 pulse 58 height 5 foot 6 weight 140. HEENT; nontraumatic normocephalic EOMI PERRLA sclera anicteric, Supple; no JVD,  Cardiovascular; regular rate and rhythm with S1-S2 no murmur or gallop, capillary clear bilaterally, Abdomen; soft basically nontender there is no palpable mass or hepatosplenomegaly bowel sounds are active, Rectal; exam not done, Extremities; no clubbing cyanosis or edema skin warm and dry, Psych; mood and affect normal and appropriate        Assessment & Plan:  #73  52 year old white female with history of chronic GERD on chronic PPI therapy presenting with acute lower chest pain associated with eating x4 days as well as mild dysphagia and nausea. Her symptoms are most consistent with an acute esophagitis, likely reflux She is improving with addition of Zantac and Mylanta  Plan; will increase  dexilant 60 mg by mouth to  twice daily x2 weeks Add Carafate 1 g slurry 4 times daily between meals and at bedtime She can stop the Zantac and Mylanta once she starts the above regimen Have reviewed a strict antireflux regimen including elevation of the head of the bed Bland diet We have scheduled her for an upper endoscopy with Dr. Leone Payor for next week-procedure discussed in detail with the patient she is agreeable to proceed. If she continues to improve this weekend is feeling better by the end of the week she may cancel the endoscopy.

## 2013-03-24 NOTE — Patient Instructions (Addendum)
Please go to the basement level to have your labs drawn.  We have given you samples, Dexliant 60 mg, take 1 tab 30 min before breakfast and dinner. After two weeks go back to once daily. We sent a prescription for [facility], [address] for Carafate Slurry liquid.  Take 1 dose in between meals and at bedtime.  You have been scheduled for an endoscopy with propofol. Please follow written instructions given to you at your visit today. If you use inhalers (even only as needed), please bring them with you on the day of your procedure. Your physician has requested that you go to www.startemmi.com and enter the access code given to you at your visit today. This web site gives a general overview about your procedure. However, you should still follow specific instructions given to you by our office regarding your preparation for the procedure.

## 2013-03-31 ENCOUNTER — Telehealth: Payer: Self-pay | Admitting: Internal Medicine

## 2013-03-31 ENCOUNTER — Ambulatory Visit: Payer: BC Managed Care – PPO | Admitting: Internal Medicine

## 2013-03-31 NOTE — Telephone Encounter (Signed)
OK No problem No charges please

## 2013-06-03 ENCOUNTER — Other Ambulatory Visit: Payer: Self-pay

## 2013-06-03 MED ORDER — DEXLANSOPRAZOLE 60 MG PO CPDR: 60.0000 mg | DELAYED_RELEASE_CAPSULE | Freq: Every day | ORAL | Status: DC

## 2013-06-19 ENCOUNTER — Other Ambulatory Visit: Payer: Self-pay

## 2013-07-19 ENCOUNTER — Telehealth: Payer: Self-pay | Admitting: Internal Medicine

## 2013-07-19 MED ORDER — LEVOTHYROXINE SODIUM 125 MCG PO TABS: 125.0000 ug | ORAL_TABLET | Freq: Every day | ORAL | Status: DC

## 2013-07-19 NOTE — Telephone Encounter (Signed)
Patient called and stated that she has ran out of her levothyroxine (SYNTHROID, LEVOTHROID) 125 MCG tablet. Patient wanted to know if she could get a refill called into [facility] on [address]. Also if we could call her and let her know if its done.

## 2013-07-19 NOTE — Telephone Encounter (Signed)
Spoke to pt told her will send 30 day supply but needs to make an appt has not been seen since 03/2012. Pt verbalized understanding. Rx sent to pharmacy.

## 2013-07-23 ENCOUNTER — Telehealth: Payer: Self-pay | Admitting: Internal Medicine

## 2013-07-23 NOTE — Telephone Encounter (Signed)
Pt would like to come in have blood work for tsh. Can I sch?

## 2013-07-23 NOTE — Telephone Encounter (Signed)
Pt needs to see Dr Cato Mulligan and have full cpx labs done.  She has not seen him or had labs done in over a year.  This was explained to her last week by Corky Mull, LPN.  Please schedule

## 2013-07-23 NOTE — Telephone Encounter (Signed)
Pt has been sch

## 2013-07-23 NOTE — Telephone Encounter (Signed)
lmom for pt to call back

## 2013-08-19 ENCOUNTER — Telehealth: Payer: Self-pay | Admitting: Internal Medicine

## 2013-08-19 MED ORDER — LEVOTHYROXINE SODIUM 125 MCG PO TABS: 125.0000 ug | ORAL_TABLET | Freq: Every day | ORAL | Status: DC

## 2013-08-19 NOTE — Telephone Encounter (Signed)
Requesting refill levothyroxine (SYNTHROID, LEVOTHROID) 125 MCG tablet [facility] [address]  Pt state she has appt for labs on Friday but was told to call back for a refill if she runs out before her lab appt.

## 2013-08-19 NOTE — Telephone Encounter (Signed)
rx sent in electronically 

## 2013-08-22 ENCOUNTER — Ambulatory Visit (INDEPENDENT_AMBULATORY_CARE_PROVIDER_SITE_OTHER): Payer: BC Managed Care – PPO | Admitting: Internal Medicine

## 2013-08-22 ENCOUNTER — Encounter: Payer: Self-pay | Admitting: Internal Medicine

## 2013-08-22 VITALS — BP 112/80 | HR 64 | Temp 98.0°F | Ht 66.0 in | Wt 146.0 lb

## 2013-08-22 DIAGNOSIS — K219 Gastro-esophageal reflux disease without esophagitis: Secondary | ICD-10-CM

## 2013-08-22 DIAGNOSIS — E039 Hypothyroidism, unspecified: Secondary | ICD-10-CM

## 2013-08-22 LAB — T4, FREE: Free T4: 0.62 ng/dL (ref 0.60–1.60)

## 2013-08-22 LAB — BASIC METABOLIC PANEL
BUN: 19 mg/dL (ref 6–23)
CO2: 26 mEq/L (ref 19–32)
Calcium: 9.2 mg/dL (ref 8.4–10.5)
Chloride: 104 mEq/L (ref 96–112)
Creatinine, Ser: 0.8 mg/dL (ref 0.4–1.2)
GFR: 85.19 mL/min (ref >60.00)
Glucose, Bld: 85 mg/dL (ref 70–99)
Potassium: 4.2 mEq/L (ref 3.5–5.1)
Sodium: 136 mEq/L (ref 135–145)

## 2013-08-22 LAB — CBC WITH DIFFERENTIAL/PLATELET
Basophils Absolute: 0 10*3/uL (ref 0.0–0.1)
Basophils Relative: 0.8 % (ref 0.0–3.0)
Eosinophils Absolute: 0.1 10*3/uL (ref 0.0–0.7)
Eosinophils Relative: 1.7 % (ref 0.0–5.0)
HCT: 34.8 % — ABNORMAL LOW (ref 36.0–46.0)
Hemoglobin: 11.6 g/dL — ABNORMAL LOW (ref 12.0–15.0)
Lymphocytes Relative: 26.5 % (ref 12.0–46.0)
Lymphs Abs: 1.3 10*3/uL (ref 0.7–4.0)
MCHC: 33.2 g/dL (ref 30.0–36.0)
MCV: 86.4 fl (ref 78.0–100.0)
Monocytes Absolute: 0.5 10*3/uL (ref 0.1–1.0)
Monocytes Relative: 9.8 % (ref 3.0–12.0)
Neutro Abs: 3 10*3/uL (ref 1.4–7.7)
Neutrophils Relative %: 61.2 % (ref 43.0–77.0)
Platelets: 261 10*3/uL (ref 150.0–400.0)
RBC: 4.03 Mil/uL (ref 3.87–5.11)
RDW: 14.7 % — ABNORMAL HIGH (ref 11.5–14.6)
WBC: 4.9 10*3/uL (ref 4.5–10.5)

## 2013-08-22 LAB — LIPID PANEL
Cholesterol: 286 mg/dL — ABNORMAL HIGH (ref 0–200)
HDL: 117.6 mg/dL (ref >39.00)
Total CHOL/HDL Ratio: 2
Triglycerides: 51 mg/dL (ref 0.0–149.0)
VLDL: 10.2 mg/dL (ref 0.0–40.0)

## 2013-08-22 LAB — HEPATIC FUNCTION PANEL
ALT: 20 U/L (ref 0–35)
AST: 22 U/L (ref 0–37)
Albumin: 4 g/dL (ref 3.5–5.2)
Alkaline Phosphatase: 59 U/L (ref 39–117)
Bilirubin, Direct: 0 mg/dL (ref 0.0–0.3)
Total Bilirubin: 0.6 mg/dL (ref 0.3–1.2)
Total Protein: 7.4 g/dL (ref 6.0–8.3)

## 2013-08-22 LAB — TSH: TSH: 5.43 u[IU]/mL (ref 0.35–5.50)

## 2013-08-22 LAB — LDL CHOLESTEROL, DIRECT: Direct LDL: 139.8 mg/dL

## 2013-08-22 LAB — T3, FREE: T3, Free: 2.5 pg/mL (ref 2.3–4.2)

## 2013-08-22 NOTE — Progress Notes (Signed)
Pre visit review using our clinic review tool, if applicable. No additional management support is needed unless otherwise documented below in the visit note. 

## 2013-08-22 NOTE — Assessment & Plan Note (Signed)
Will check cbc

## 2013-08-22 NOTE — Assessment & Plan Note (Signed)
Will check labs Weight gain is odd Will check  Additional labs too.

## 2013-08-22 NOTE — Progress Notes (Signed)
Hypothyroid- she has noted weight gain and continues to exercise and eat well. Not e 20 lb weight gain in 18 months.   Mood- taking zoloft 200 mg po qd. Cries occasionally.   gerd- tolerating meds  Past Medical History  Diagnosis Date  . GERD 07/18/2007  . HYPOTHYROIDISM 07/18/2007  . MITRAL VALVE PROLAPSE 07/18/2007  . 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 Pill esophagitis 2007  . Disorder of sebaceous glands     sebaceous neoplasm  . Anemia     History   Social History  . Marital Status: Married    Spouse Name: N/A    Number of Children: 2  . 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 Smoking status: Never Smoker   . Smokeless tobacco: Never Used  . Alcohol Use: 1.8 oz/week    3 Glasses of wine per week  . Drug Use: No  . Sexual Activity: Not on file   Other Topics Concern  . Not on file   Social History Narrative  . No narrative on file    Past Surgical History  Procedure Laterality Date  . Appendectomy    . Colonoscopy  multiple    Normal  . Upper gastrointestinal endoscopy  02/08/2009    inflammatory nodule in antrum, 50 Fr Maloney dilation  . Upper gastrointestinal endoscopy  2007x2    pill esophagitis/ulcer  . Upper gastrointestinal endoscopy  2004    erosive esophagitis and mild esophageal stricture - dilated    Family History  Problem Relation Age of Onset  . Hypertension Brother   . Colon cancer Neg Hx     Allergies  Allergen Reactions  . Sulfamethoxazole     REACTION: hives  . Tetracycline Hcl     REACTION: ulcers in throat    Current Outpatient Prescriptions on File Prior to Visit  Medication Sig Dispense Refill  . cyanocobalamin 2000 MCG tablet Take 2,000 mcg by mouth daily.      Marland Kitchen. dexlansoprazole (DEXILANT) 60 MG capsule Take 1 capsule (60 mg total) by mouth daily.  90 capsule  3  . levothyroxine (SYNTHROID, LEVOTHROID) 125 MCG tablet Take 1 tablet (125 mcg total)  by mouth daily.  30 tablet  0  . sertraline (ZOLOFT) 100 MG tablet Take 200 mg by mouth daily.       No current facility-administered medications on file prior to visit.     patient denies chest pain, shortness of breath, orthopnea. Denies lower extremity edema, abdominal pain, change in appetite, change in bowel movements. Patient denies rashes, musculoskeletal complaints. No other specific complaints in a complete review of systems.   BP 112/80  Pulse 64  Temp(Src) 98 F (36.7 C) (Oral)  Ht 5\' 6"  (1.676 m)  Wt 146 lb (66.225 kg)  BMI 23.58 kg/m2  Well-developed well-nourished female in no acute distress. HEENT exam atraumatic, normocephalic, extraocular muscles are intact. Neck is supple. No jugular venous distention no thyromegaly. Chest clear to auscultation without increased work of breathing. Cardiac exam S1 and S2 are regular. Abdominal exam active bowel sounds, soft, nontender. Extremities no edema. Neurologic exam she is alert without any motor sensory deficits. Gait is normal.

## 2013-08-26 ENCOUNTER — Other Ambulatory Visit: Payer: Self-pay | Admitting: *Deleted

## 2013-08-26 MED ORDER — LEVOTHYROXINE SODIUM 150 MCG PO TABS: 150.0000 ug | ORAL_TABLET | Freq: Every day | ORAL | Status: DC

## 2013-09-04 ENCOUNTER — Telehealth: Payer: Self-pay

## 2013-09-04 NOTE — Telephone Encounter (Signed)
Did prior authorization for Dexilant thru [facility] [employer] [place]  Phone # [phone], ext [phone],  Dx: [phone] GERD, tried and failed Pantoprazole and Zantac. Received approval from 09/04/13-08/13/2038, sent down to be scanned in.

## 2013-09-08 ENCOUNTER — Telehealth: Payer: Self-pay | Admitting: Internal Medicine

## 2013-09-08 NOTE — Telephone Encounter (Signed)
Left message that the prior auth.was done for the dexilant last week, approved 09/04/13-09/04/2037 thru her mail order pharmacy.

## 2013-09-10 ENCOUNTER — Other Ambulatory Visit: Payer: Self-pay

## 2013-09-10 MED ORDER — DEXLANSOPRAZOLE 60 MG PO CPDR: 60.0000 mg | DELAYED_RELEASE_CAPSULE | Freq: Every day | ORAL | Status: DC

## 2013-09-10 NOTE — Telephone Encounter (Signed)
Prior auth. done recently on Dexilant and they needed new rx sent in, faxed back to [phone].

## 2013-10-14 ENCOUNTER — Other Ambulatory Visit (INDEPENDENT_AMBULATORY_CARE_PROVIDER_SITE_OTHER): Payer: BC Managed Care – PPO

## 2013-10-14 DIAGNOSIS — K219 Gastro-esophageal reflux disease without esophagitis: Secondary | ICD-10-CM

## 2013-10-14 DIAGNOSIS — E039 Hypothyroidism, unspecified: Secondary | ICD-10-CM

## 2013-10-14 LAB — CBC WITH DIFFERENTIAL/PLATELET
Basophils Absolute: 0 10*3/uL (ref 0.0–0.1)
Basophils Relative: 0.8 % (ref 0.0–3.0)
Eosinophils Absolute: 0.1 10*3/uL (ref 0.0–0.7)
Eosinophils Relative: 2.4 % (ref 0.0–5.0)
HCT: 35.1 % — ABNORMAL LOW (ref 36.0–46.0)
Hemoglobin: 11.3 g/dL — ABNORMAL LOW (ref 12.0–15.0)
Lymphocytes Relative: 23 % (ref 12.0–46.0)
Lymphs Abs: 1.2 10*3/uL (ref 0.7–4.0)
MCHC: 32.3 g/dL (ref 30.0–36.0)
MCV: 87.4 fl (ref 78.0–100.0)
Monocytes Absolute: 0.5 10*3/uL (ref 0.1–1.0)
Monocytes Relative: 8.6 % (ref 3.0–12.0)
Neutro Abs: 3.4 10*3/uL (ref 1.4–7.7)
Neutrophils Relative %: 65.2 % (ref 43.0–77.0)
Platelets: 306 10*3/uL (ref 150.0–400.0)
RBC: 4.02 Mil/uL (ref 3.87–5.11)
RDW: 14.9 % — ABNORMAL HIGH (ref 11.5–14.6)
WBC: 5.2 10*3/uL (ref 4.5–10.5)

## 2013-10-14 LAB — VITAMIN B12: Vitamin B-12: 840 pg/mL (ref 211–911)

## 2013-10-14 LAB — FERRITIN: Ferritin: 8.1 ng/mL — ABNORMAL LOW (ref 10.0–291.0)

## 2013-10-14 LAB — TSH: TSH: 2.39 u[IU]/mL (ref 0.35–5.50)

## 2014-02-09 ENCOUNTER — Other Ambulatory Visit: Payer: Self-pay | Admitting: Internal Medicine

## 2014-02-27 ENCOUNTER — Other Ambulatory Visit (INDEPENDENT_AMBULATORY_CARE_PROVIDER_SITE_OTHER): Payer: BC Managed Care – PPO

## 2014-02-27 DIAGNOSIS — D649 Anemia, unspecified: Secondary | ICD-10-CM

## 2014-02-27 LAB — CBC WITH DIFFERENTIAL/PLATELET
Basophils Absolute: 0 10*3/uL (ref 0.0–0.1)
Basophils Relative: 0.4 % (ref 0.0–3.0)
Eosinophils Absolute: 0.1 10*3/uL (ref 0.0–0.7)
Eosinophils Relative: 1.2 % (ref 0.0–5.0)
HCT: 37.4 % (ref 36.0–46.0)
Hemoglobin: 12.5 g/dL (ref 12.0–15.0)
Lymphocytes Relative: 15.8 % (ref 12.0–46.0)
Lymphs Abs: 1.5 10*3/uL (ref 0.7–4.0)
MCHC: 33.3 g/dL (ref 30.0–36.0)
MCV: 90.6 fl (ref 78.0–100.0)
Monocytes Absolute: 0.7 10*3/uL (ref 0.1–1.0)
Monocytes Relative: 6.9 % (ref 3.0–12.0)
Neutro Abs: 7.3 10*3/uL (ref 1.4–7.7)
Neutrophils Relative %: 75.7 % (ref 43.0–77.0)
Platelets: 328 10*3/uL (ref 150.0–400.0)
RBC: 4.13 Mil/uL (ref 3.87–5.11)
RDW: 14.8 % (ref 11.5–15.5)
WBC: 9.7 10*3/uL (ref 4.0–10.5)

## 2014-02-27 LAB — FERRITIN: Ferritin: 15.5 ng/mL (ref 10.0–291.0)

## 2014-03-03 ENCOUNTER — Other Ambulatory Visit: Payer: Self-pay

## 2014-03-03 DIAGNOSIS — Z1231 Encounter for screening mammogram for malignant neoplasm of breast: Secondary | ICD-10-CM

## 2014-03-20 ENCOUNTER — Ambulatory Visit: Payer: BC Managed Care – PPO

## 2014-06-08 ENCOUNTER — Other Ambulatory Visit: Payer: Self-pay | Admitting: Internal Medicine

## 2014-06-26 ENCOUNTER — Ambulatory Visit: Payer: BC Managed Care – PPO | Admitting: Family Medicine

## 2014-07-07 ENCOUNTER — Ambulatory Visit: Payer: BC Managed Care – PPO | Admitting: Family Medicine

## 2014-07-08 ENCOUNTER — Encounter: Payer: Self-pay | Admitting: Family Medicine

## 2014-07-08 ENCOUNTER — Ambulatory Visit (INDEPENDENT_AMBULATORY_CARE_PROVIDER_SITE_OTHER): Payer: BC Managed Care – PPO | Admitting: Family Medicine

## 2014-07-08 VITALS — BP 122/70 | Temp 98.1°F | Wt 157.0 lb

## 2014-07-08 DIAGNOSIS — E034 Atrophy of thyroid (acquired): Secondary | ICD-10-CM

## 2014-07-08 DIAGNOSIS — E038 Other specified hypothyroidism: Secondary | ICD-10-CM

## 2014-07-08 NOTE — Progress Notes (Signed)
Tana Conch, MD Phone: [phone]  Subjective:  Patient presents today to establish care with me as their new primary care provider. Patient was formerly a patient of Dr. Cato Mulligan. Chief complaint-noted.   Hypothyroidism-good control On thyroid medication-levothyroxine (increased in January.  ROS-No hair or nail changes. No heat/cold intolerance (except for menopausal hot flashes). No constipation or diarrhea. No palpitations.   The following were reviewed and entered/updated in epic: Past Medical History  Diagnosis Date  . GERD 07/18/2007  . HYPOTHYROIDISM 07/18/2007  . MITRAL VALVE PROLAPSE 07/18/2007  . 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 Pill esophagitis 2007  . Disorder of sebaceous glands     sebaceous neoplasm  . Anemia    Patient Active Problem List   Diagnosis Date Noted  . Anxiety 10/10/2010    Priority: Medium  . Hypothyroidism 07/18/2007    Priority: Medium  . Dysphagia 02/24/2011    Priority: Low  . Mitral valve prolapse 07/18/2007    Priority: Low  . GERD 07/18/2007    Priority: Low   Past Surgical History  Procedure Laterality Date  . Appendectomy    . Colonoscopy  multiple    Normal  . Upper gastrointestinal endoscopy  02/08/2009    inflammatory nodule in antrum, 50 Fr Maloney dilation  . Upper gastrointestinal endoscopy  2007x2    pill esophagitis/ulcer  . Upper gastrointestinal endoscopy  2004    erosive esophagitis and mild esophageal stricture - dilated    Family History  Problem Relation Age of Onset  . Hypertension Brother   . Colon cancer Neg Hx   . Hypertension Father     Medications- reviewed and updated Current Outpatient Prescriptions  Medication Sig Dispense Refill  . buPROPion (WELLBUTRIN XL) 300 MG 24 hr tablet Take 300 mg by mouth daily.    . cyanocobalamin 2000 MCG tablet Take 2,000 mcg by mouth daily.    Marland Kitchen DEXILANT 60 MG capsule TAKE 1 CAPSULE BY MOUTH EVERY DAY 90 capsule 0  . levothyroxine (SYNTHROID,  LEVOTHROID) 150 MCG tablet TAKE 1 TABLET BY MOUTH EVERY DAY 90 tablet 3   No current facility-administered medications for this visit.    Allergies-reviewed and updated Allergies  Allergen Reactions  . Sulfamethoxazole     REACTION: hives  . Tetracycline Hcl     REACTION: ulcers in throat    History   Social History  . Marital Status: Married    Spouse Name: N/A    Number of Children: 2  . 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 Smoking status: Never Smoker   . Smokeless tobacco: Never Used  . Alcohol Use: 1.8 oz/week    3 Glasses of wine per week  . Drug Use: No  . Sexual Activity: None   Other Topics Concern  . None   Social History Narrative   Married. 2 children. No grandkids. English Bulldog and boston terrier. grandpet-rescue dog.       Research officer, political party for orthodontist      Hobbies: exercise, travel ([place] favorite spot)    ROS--See HPI   Objective: BP 122/70 mmHg  Temp(Src) 98.1 F (36.7 C)  Wt 157 lb (71.215 kg) Gen: NAD, resting comfortably Neck: no thyromegaly CV: RRR no murmurs rubs or gallops Lungs: CTAB no crackles, wheeze, rhonchi Abdomen: soft/nontender/nondistended/normal bowel sounds.  Ext: no edema Skin: warm, dry Neuro: grossly normal, moves all extremities   Assessment/Plan:  Hypothyroidism Good  control previously on levothyroxine. Adjustment earlier this year to this dose. Recheck tsh today.   Return precautions advised. 6-12 month follow up (6 months if anxiety an issue) See AVS for HM details.   Orders Placed This Encounter  Procedures  . TSH    [place]

## 2014-07-08 NOTE — Assessment & Plan Note (Signed)
Good control previously on levothyroxine. Adjustment earlier this year to this dose. Recheck tsh today.

## 2014-07-08 NOTE — Patient Instructions (Addendum)
Thyroid today. Send you a message with results.   Health Maintenance Due  Topic Date Due  . TETANUS/TDAP - call Keba with date 05/23/1981  . MAMMOGRAM -handout given 03/07/2014   See me 6-12 months

## 2014-07-09 LAB — TSH: TSH: 0.203 u[IU]/mL — ABNORMAL LOW (ref 0.350–4.500)

## 2014-07-10 MED ORDER — LEVOTHYROXINE SODIUM 137 MCG PO TABS: 137.0000 ug | ORAL_TABLET | Freq: Every day | ORAL | Status: DC

## 2014-07-10 NOTE — Addendum Note (Signed)
Addended by: Shelva MajesticHUNTER, STEPHEN O on: 07/10/2014 02:53 PM   Modules accepted: Orders

## 2014-08-24 ENCOUNTER — Telehealth: Payer: Self-pay

## 2014-08-24 NOTE — Telephone Encounter (Addendum)
[facility] phone, the recording says they are unavailable due to high call volume.  Will try again tomorrow.

## 2014-08-25 NOTE — Telephone Encounter (Signed)
[name] Case, CMA was able to get customer service on the phone and got a fax sent which we have filled out and faxed back to [facility] for the [employer] approval.

## 2014-09-01 NOTE — Telephone Encounter (Signed)
[facility] and patient's dexilant was approved but still cost her over $200.00 as she has a high deductible. She picked up the rx per the pharmacist.  I will mail her out a coupon we have to see if that will help with future refills and also tell her that the pharmacist said for her to try Dexilant.com to see if that will help.

## 2014-09-26 ENCOUNTER — Other Ambulatory Visit: Payer: Self-pay | Admitting: Internal Medicine

## 2014-12-04 ENCOUNTER — Ambulatory Visit: Payer: BC Managed Care – PPO | Admitting: Family Medicine

## 2014-12-28 ENCOUNTER — Encounter: Payer: Self-pay | Admitting: Internal Medicine

## 2014-12-31 ENCOUNTER — Other Ambulatory Visit: Payer: Self-pay | Admitting: Internal Medicine

## 2015-01-01 ENCOUNTER — Encounter: Payer: Self-pay | Admitting: *Deleted

## 2015-01-06 ENCOUNTER — Other Ambulatory Visit: Payer: Self-pay

## 2015-01-06 DIAGNOSIS — Z1231 Encounter for screening mammogram for malignant neoplasm of breast: Secondary | ICD-10-CM

## 2015-01-29 ENCOUNTER — Ambulatory Visit
Admission: RE | Admit: 2015-01-29 | Discharge: 2015-01-29 | Disposition: A | Discharge status: Home / Self Care | Payer: BLUE CROSS/BLUE SHIELD | Source: Ambulatory Visit

## 2015-01-29 DIAGNOSIS — Z1231 Encounter for screening mammogram for malignant neoplasm of breast: Secondary | ICD-10-CM

## 2015-01-31 ENCOUNTER — Other Ambulatory Visit: Payer: Self-pay | Admitting: Internal Medicine

## 2015-02-01 ENCOUNTER — Other Ambulatory Visit: Payer: Self-pay | Admitting: Family Medicine

## 2015-02-01 DIAGNOSIS — R928 Other abnormal and inconclusive findings on diagnostic imaging of breast: Secondary | ICD-10-CM

## 2015-02-05 ENCOUNTER — Ambulatory Visit
Admission: RE | Admit: 2015-02-05 | Discharge: 2015-02-05 | Disposition: A | Discharge status: Home / Self Care | Payer: BLUE CROSS/BLUE SHIELD | Source: Ambulatory Visit | Attending: Family Medicine | Admitting: Family Medicine

## 2015-02-05 DIAGNOSIS — R928 Other abnormal and inconclusive findings on diagnostic imaging of breast: Secondary | ICD-10-CM

## 2015-05-02 ENCOUNTER — Other Ambulatory Visit: Payer: Self-pay | Admitting: Family Medicine

## 2015-06-09 ENCOUNTER — Other Ambulatory Visit: Payer: Self-pay | Admitting: Internal Medicine

## 2015-06-10 ENCOUNTER — Telehealth: Payer: Self-pay

## 2015-06-10 NOTE — Telephone Encounter (Signed)
Called [facility] to do a prior authorization [id number] for Dexilant.  Dx- GERD- K21.9  Has tried and failed pantoprazole.  Approved thru 06/09/2017.  [facility] informed and they said it was going to be $249.58 so I gave them coupon information: RxBIN: [id number], RxGRP: [id number], RxPCN: [id number], exp: 08/14/2015.   The pharmacy will re-run it and hopefully it will be less.

## 2015-08-03 ENCOUNTER — Other Ambulatory Visit: Payer: Self-pay | Admitting: Family Medicine

## 2015-08-04 NOTE — Telephone Encounter (Signed)
Would you like to see pt for follow up or yearly exam?  Pt was due in November.

## 2015-08-04 NOTE — Telephone Encounter (Signed)
Left a message on home and cell for a return call. 

## 2015-08-04 NOTE — Telephone Encounter (Signed)
She at least needs 15 minute follow up scheduled but may give her #30 pill to allow her time to get in. We will likely have to schedule physical at later date

## 2015-08-05 ENCOUNTER — Other Ambulatory Visit: Payer: Self-pay | Admitting: *Deleted

## 2015-08-05 MED ORDER — LEVOTHYROXINE SODIUM 150 MCG PO TABS: 150.0000 ug | ORAL_TABLET | Freq: Every day | ORAL | Status: DC

## 2015-08-05 NOTE — Telephone Encounter (Signed)
Keba please check on patient tomorrow about scheduling previously noted follow up

## 2015-08-06 NOTE — Telephone Encounter (Signed)
Called and lm on pt vm tcb. 

## 2015-08-24 ENCOUNTER — Encounter: Payer: Self-pay | Admitting: Family Medicine

## 2015-08-24 ENCOUNTER — Ambulatory Visit (INDEPENDENT_AMBULATORY_CARE_PROVIDER_SITE_OTHER): Payer: BLUE CROSS/BLUE SHIELD | Admitting: Family Medicine

## 2015-08-24 VITALS — BP 118/60 | HR 72 | Temp 98.4°F | Wt 125.0 lb

## 2015-08-24 DIAGNOSIS — E038 Other specified hypothyroidism: Secondary | ICD-10-CM

## 2015-08-24 DIAGNOSIS — B349 Viral infection, unspecified: Secondary | ICD-10-CM | POA: Diagnosis not present

## 2015-08-24 DIAGNOSIS — B9789 Other viral agents as the cause of diseases classified elsewhere: Secondary | ICD-10-CM

## 2015-08-24 DIAGNOSIS — J329 Chronic sinusitis, unspecified: Secondary | ICD-10-CM | POA: Diagnosis not present

## 2015-08-24 DIAGNOSIS — E034 Atrophy of thyroid (acquired): Secondary | ICD-10-CM | POA: Diagnosis not present

## 2015-08-24 MED ORDER — AMOXICILLIN-POT CLAVULANATE 875-125 MG PO TABS: 1.0000 | ORAL_TABLET | Freq: Two times a day (BID) | ORAL | Status: DC

## 2015-08-24 NOTE — Patient Instructions (Signed)
Sinsusitis  Viral based on <10 days, no double sickening, lack of severity of symptoms in first 3 days. We also discussed reasons why current illness does not meet criteria for bacterial illness and therefore no antibiotics indicated. Also educated on signs that bacterial infection may have developed.   Treatment: -considered prednisone: patient opted in: we will use depo-medrol -other symptomatic care with tylenol or ibuprofen for sinus pressure -Antibiotic indicated: no, but given day 7 and symptoms and still worsening, given antibiotic in case symptoms persist into day 10 without at least 50% improvement  Finally, we reviewed reasons to return to care including if symptoms worsen or persist or new concerns arise.  Meds ordered this encounter  . amoxicillin-clavulanate (AUGMENTIN) 875-125 MG tablet    Sig: Take 1 tablet by mouth 2 (two) times daily. May fill on 08/27/15 if not at least 50% better    Dispense:  14 tablet    Refill:  0

## 2015-08-24 NOTE — Progress Notes (Signed)
PCP: Tana ConchStephen Hunter, MD  Subjective:  [name] Case is a 55 y.o. year old very pleasant female patient who presents with sinusitis symptoms including nasal congestion with green/yellow discharge, sinus tenderness. Also see problem oriented charting -other symptoms include: cough, sore throat, postnasal drip -day of illness:7 -started: last wednesday -Symptoms are worsening -previous treatments: tylenol/ibuprofen -sick contacts/travel/risks: denies flu exposure. Has been around several sick contacts around the holidays  ROS-denies SOB, NVD, tooth pain. Had some temperatures of 100.8 at home early on  Pertinent Past Medical History-  Patient Active Problem List   Diagnosis Date Noted  . Anxiety 10/10/2010    Priority: Medium  . Hypothyroidism 07/18/2007    Priority: Medium  . Dysphagia 02/24/2011    Priority: Low  . Mitral valve prolapse 07/18/2007    Priority: Low  . GERD 07/18/2007    Priority: Low    Medications- reviewed  Current Outpatient Prescriptions  Medication Sig Dispense Refill  . buPROPion (WELLBUTRIN XL) 300 MG 24 hr tablet Take 300 mg by mouth daily.    . cyanocobalamin 2000 MCG tablet Take 2,000 mcg by mouth daily.    Marland Kitchen. DEXILANT 60 MG capsule TAKE 1 CAPSULE BY MOUTH EVERY DAY 90 capsule 0  . levothyroxine (SYNTHROID, LEVOTHROID) 150 MCG tablet Take 1 tablet (150 mcg total) by mouth daily. 30 tablet 0  . ipratropium (ATROVENT) 0.06 % nasal spray U 2 SPRAYS IEN BID FOR NASAL COG AND DRAINAGE  0   No current facility-administered medications for this visit.    Objective: BP 118/60 mmHg  Pulse 72  Temp(Src) 98.4 F (36.9 C) (Oral)  Wt 125 lb (56.7 kg)  SpO2 98% Gen: NAD, resting comfortably HEENT: Turbinates erythematous with yellow drainage, TM normal, pharynx mildly erythematous with no tonsilar exudate or edema,  sinus tenderness CV: RRR no murmurs rubs or gallops Lungs: CTAB no crackles, wheeze, rhonchi Abdomen: soft/nontender/nondistended/normal  bowel sounds. No rebound or guarding.  Ext: no edema Skin: warm, dry, no rash Neuro: grossly normal, moves all extremities  Assessment/Plan:  Sinsusitis  Viral based on <10 days, no double sickening, lack of severity of symptoms in first 3 days. We also discussed reasons why current illness does not meet criteria for bacterial illness and therefore no antibiotics indicated. Also educated on signs that bacterial infection may have developed.   Treatment: -considered prednisone: patient opted in: we will use depo-medrol -other symptomatic care with tylenol or ibuprofen for sinus pressure -Antibiotic indicated: no, but given day 7 and symptoms and still worsening, given antibiotic in case symptoms persist into day 10 without at least 50% improvement  Finally, we reviewed reasons to return to care including if symptoms worsen or persist or new concerns arise.  Meds ordered this encounter  . amoxicillin-clavulanate (AUGMENTIN) 875-125 MG tablet    Sig: Take 1 tablet by mouth 2 (two) times daily. May fill on 08/27/15 if not at least 50% better    Dispense:  14 tablet    Refill:  0   Depo medrol 40mg  given as well  Hypothyroidism- S: previously overtreated and changed rx to 137mcg. Unclear how got changed back to 150mcg. Has bene over a year since patient followed up. No changes in weight, fatigue except for acute illness.  ROS-No hair or nail changes. No heat/cold intolerance. No constipation or diarrhea. Denies shakiness Lab Results  Component Value Date   TSH 0.203* 07/08/2014  A/P: check TSH today. 6 months if normal- sooner if not controlled

## 2015-08-25 LAB — TSH: TSH: 3.08 u[IU]/mL (ref 0.35–4.50)

## 2015-08-27 ENCOUNTER — Ambulatory Visit: Payer: BLUE CROSS/BLUE SHIELD | Admitting: Family Medicine

## 2015-08-30 ENCOUNTER — Other Ambulatory Visit: Payer: Self-pay | Admitting: Family Medicine

## 2015-09-12 ENCOUNTER — Other Ambulatory Visit: Payer: Self-pay | Admitting: Internal Medicine

## 2015-09-15 ENCOUNTER — Telehealth: Payer: Self-pay | Admitting: Internal Medicine

## 2015-09-15 NOTE — Telephone Encounter (Signed)
Have gone back on the Covermymeds website , answered more questions and her Dexilant has been approved. Originally done on covermymeds 09/13/15.  [facility] pharmacy informed.  It will be $249.58 without the coupon.  Husband Merlyn Albert will come pick up the coupon card as they can't take it over the phone.  Her dx is GERD , K21.9  Is the ICD-10 code.  She has tried and failed pantoprazole.   Patient's appointment made for 11/19/15 at 11:30AM.

## 2015-09-15 NOTE — Telephone Encounter (Signed)
Patient husband, Merlyn Albert called back regarding this. He states that authorization from [employer] is needed for dexilant. He states that [employer] faxed our office a criteria form that needs to be filled out. [phone]

## 2015-10-15 ENCOUNTER — Encounter: Payer: Self-pay | Admitting: Family Medicine

## 2015-10-15 ENCOUNTER — Ambulatory Visit (INDEPENDENT_AMBULATORY_CARE_PROVIDER_SITE_OTHER): Payer: BLUE CROSS/BLUE SHIELD | Admitting: Family Medicine

## 2015-10-15 VITALS — BP 102/60 | HR 71 | Temp 98.5°F | Wt 126.0 lb

## 2015-10-15 DIAGNOSIS — R5383 Other fatigue: Secondary | ICD-10-CM | POA: Diagnosis not present

## 2015-10-15 DIAGNOSIS — R059 Cough, unspecified: Secondary | ICD-10-CM

## 2015-10-15 DIAGNOSIS — R053 Chronic cough: Secondary | ICD-10-CM

## 2015-10-15 DIAGNOSIS — R05 Cough: Secondary | ICD-10-CM | POA: Diagnosis not present

## 2015-10-15 LAB — CBC WITH DIFFERENTIAL/PLATELET
Basophils Absolute: 0 10*3/uL (ref 0.0–0.1)
Basophils Relative: 0.9 % (ref 0.0–3.0)
Eosinophils Absolute: 0 10*3/uL (ref 0.0–0.7)
Eosinophils Relative: 2.2 % (ref 0.0–5.0)
HCT: 33.7 % — ABNORMAL LOW (ref 36.0–46.0)
Hemoglobin: 11.1 g/dL — ABNORMAL LOW (ref 12.0–15.0)
Lymphocytes Relative: 33.8 % (ref 12.0–46.0)
Lymphs Abs: 0.7 10*3/uL (ref 0.7–4.0)
MCHC: 33.1 g/dL (ref 30.0–36.0)
MCV: 85.7 fl (ref 78.0–100.0)
Monocytes Absolute: 0.3 10*3/uL (ref 0.1–1.0)
Monocytes Relative: 14.2 % — ABNORMAL HIGH (ref 3.0–12.0)
Neutro Abs: 1 10*3/uL — ABNORMAL LOW (ref 1.4–7.7)
Neutrophils Relative %: 48.9 % (ref 43.0–77.0)
Platelets: 212 10*3/uL (ref 150.0–400.0)
RBC: 3.93 Mil/uL (ref 3.87–5.11)
RDW: 15.5 % (ref 11.5–15.5)
WBC: 2.1 10*3/uL — ABNORMAL LOW (ref 4.0–10.5)

## 2015-10-15 LAB — COMPREHENSIVE METABOLIC PANEL
ALT: 161 U/L — ABNORMAL HIGH (ref 0–35)
AST: 157 U/L — ABNORMAL HIGH (ref 0–37)
Albumin: 4.1 g/dL (ref 3.5–5.2)
Alkaline Phosphatase: 198 U/L — ABNORMAL HIGH (ref 39–117)
BUN: 10 mg/dL (ref 6–23)
CO2: 28 mEq/L (ref 19–32)
Calcium: 8.9 mg/dL (ref 8.4–10.5)
Chloride: 103 mEq/L (ref 96–112)
Creatinine, Ser: 0.82 mg/dL (ref 0.40–1.20)
GFR: 77.39 mL/min (ref >60.00)
Glucose, Bld: 107 mg/dL — ABNORMAL HIGH (ref 70–99)
Potassium: 3.8 mEq/L (ref 3.5–5.1)
Sodium: 139 mEq/L (ref 135–145)
Total Bilirubin: 0.3 mg/dL (ref 0.2–1.2)
Total Protein: 6.7 g/dL (ref 6.0–8.3)

## 2015-10-15 LAB — POCT INFLUENZA A/B
Influenza A, POC: POSITIVE — AB
Influenza B, POC: POSITIVE — AB

## 2015-10-15 LAB — TSH: TSH: 1.89 u[IU]/mL (ref 0.35–4.50)

## 2015-10-15 MED ORDER — OSELTAMIVIR PHOSPHATE 75 MG PO CAPS: 75.0000 mg | ORAL_CAPSULE | Freq: Two times a day (BID) | ORAL | Status: DC

## 2015-10-15 NOTE — Patient Instructions (Signed)
Test for flu  Labs before you go  We will call you within a week about your referral for CT scan. If you do not hear by end of next week- give us a call  If you have thoughts of hurting yourself, you agreed to call 911 immediately for help.

## 2015-10-15 NOTE — Progress Notes (Signed)
Tana Conch, MD  Subjective:  [name] Case is a 55 y.o. year old very pleasant female patient who presents for/with See problem oriented charting ROS- decreased appetite, depressed mood, anxiety noted. No chest pain. Denies shortness of breath.   Past Medical History-  Patient Active Problem List   Diagnosis Date Noted  . Anxiety 10/10/2010    Priority: Medium  . Hypothyroidism 07/18/2007    Priority: Medium  . Dysphagia 02/24/2011    Priority: Low  . Mitral valve prolapse 07/18/2007    Priority: Low  . GERD 07/18/2007    Priority: Low   Medications- reviewed and updated Current Outpatient Prescriptions  Medication Sig Dispense Refill  . buPROPion (WELLBUTRIN XL) 300 MG 24 hr tablet Take 300 mg by mouth daily.    Marland Kitchen DEXILANT 60 MG capsule TAKE ONE CAPSULE BY MOUTH EVERY DAY 90 capsule 0  . levothyroxine (SYNTHROID, LEVOTHROID) 150 MCG tablet TAKE 1 TABLET(150 MCG) BY MOUTH DAILY 30 tablet 5   Objective: BP 102/60 mmHg  Pulse 71  Temp(Src) 98.5 F (36.9 C)  Wt 126 lb (57.153 kg) Gen: NAD, well appearing. Becomes very tearful when talking about illness. Does have intermittent deep cough TM normal, pharynx erythematous, nares erythematous with clear drainage CV: RRR no murmurs rubs or gallops Lungs: CTAB no crackles, wheeze, rhonchi Abdomen: soft/nontender/nondistended/normal bowel sounds.  Ext: no edema specifically unilateral Skin: warm, dry, no rash Neuro: grossly normal, moves all extremities  Assessment/Plan:  Fatigue Chronic cough now >8 weeks Depression S:Sick January 1st. Seen January 8th at urgent care - told viral illness and given Tessalon and ipratropium 1/10 saw me. Thought viral sinusitis. Steroids given. Augmentin- took that for full 7 days. She was worried about thyroid at that time but was normal. Felt better after antibiotic but 2-3 days after coming off felt worse- low energy, fatigued, diarrhea, postnasal drip, headaches.   About a month into it  felt worse and went to [place] urgent care- thought it was bronchial vs. Bronchial pneumonia. Given hydromet for cough, prednisone taper, breathing treatment, albuterol, levaquin  for 10 days to cover pneumonia.  Felt improvement but never really got over it. CXR read as normal.   Has never felt back to herself, Some days feels dehydrated, feels faint, nausea. Coughing spells 15 minutes to an hour. Feels like she is getting weaker, staying in bed, very hard on her- seems to just stay in the bed. 2-3 weeks of symptoms. In last few days has felt worse again and once again has had intermittent fevers up to 101. claritin some over last week. Terrible chills. No night sweats  This has been overwhelming for her. She states she is crying all the time due to frustration of still being sick after 2 months. She has had thoughts she would be better off dead and would like to just be with her mother. States would not follow through with this but has had transient thoughts of cutting her wrists  A/P: With severity of complaints (patient well appearing other than deep cough) and patient anxiety revolving around recent events- will be aggressive in workup. CBC, CMP, TSH today. Flu swab did end up being positive and likely outside of 48 hour since started but strongly requests Tamiflu- and rx given. I think flu is likely reason for recent worsening but still does not explain nearly 8 weeks of cough (reasonable probability recurrent exposures to viral illnesses - she has been more than adequately covered for bacterial disease). We will check  Chest CT given chronic cough at this point and level of fatigue to evaluate for malignancy. Check HIV as well  Finally depressed circumstantial mood. She is able to contract for safety to call 911. Not willing to reveal to family how she is feeling at this time though. Already on wellbutrin- to continue.   Extensive counseling provided due to stress/burden of illness.    Return precautions advised. I asked her to schedule a visit a few days out from CT scan so we can discuss.    Orders Placed This Encounter  Procedures  . CT Chest W Contrast    SS/ [facility] [id number] xt 2251/ [employer] NPR/ not diabetic/ no labs req    Standing Status: Future     Number of Occurrences:      Standing Expiration Date: 12/14/2016    Order Specific Question:  If indicated for the ordered procedure, I authorize the administration of contrast media per Radiology protocol    Answer:  Yes    Order Specific Question:  Reason for Exam (SYMPTOM  OR DIAGNOSIS REQUIRED)    Answer:  cough 8 weeks. Severe fatigue, depression. CXR negative 2-3 weeks ago    Order Specific Question:  Is the patient pregnant?    Answer:  No    Order Specific Question:  Preferred imaging location?    Answer:  [place]  . CBC with Differential/Platelet  . Comprehensive metabolic panel    [place]  . TSH    [place]  . HIV antibody  . POC Influenza A/B    Meds ordered this encounter  Medications  . oseltamivir (TAMIFLU) 75 MG capsule    Sig: Take 1 capsule (75 mg total) by mouth 2 (two) times daily.    Dispense:  10 capsule    Refill:  0   The duration of face-to-face time during this visit was 40 minutes. Greater than 50% of this time was spent in counseling, explanation of diagnosis, planning of further management, and/or coordination of care.

## 2015-10-16 LAB — HIV ANTIBODY (ROUTINE TESTING W REFLEX): HIV 1&2 Ab, 4th Generation: NONREACTIVE

## 2015-10-19 ENCOUNTER — Telehealth: Payer: Self-pay | Admitting: Family Medicine

## 2015-10-19 ENCOUNTER — Encounter: Payer: Self-pay | Admitting: Family Medicine

## 2015-10-19 ENCOUNTER — Other Ambulatory Visit: Payer: Self-pay | Admitting: Family Medicine

## 2015-10-19 ENCOUNTER — Telehealth: Payer: Self-pay

## 2015-10-19 DIAGNOSIS — R7989 Other specified abnormal findings of blood chemistry: Secondary | ICD-10-CM

## 2015-10-19 DIAGNOSIS — D649 Anemia, unspecified: Secondary | ICD-10-CM

## 2015-10-19 DIAGNOSIS — R945 Abnormal results of liver function studies: Principal | ICD-10-CM

## 2015-10-19 NOTE — Telephone Encounter (Signed)
What type of labs is this pt suppose to be having . She called to schedule

## 2015-10-19 NOTE — Telephone Encounter (Signed)
Labs ordered.

## 2015-10-19 NOTE — Telephone Encounter (Signed)
Labs have been ordered

## 2015-10-20 ENCOUNTER — Ambulatory Visit (INDEPENDENT_AMBULATORY_CARE_PROVIDER_SITE_OTHER)
Admission: RE | Admit: 2015-10-20 | Discharge: 2015-10-20 | Disposition: A | Discharge status: Home / Self Care | Payer: BLUE CROSS/BLUE SHIELD | Source: Ambulatory Visit | Attending: Family Medicine | Admitting: Family Medicine

## 2015-10-20 DIAGNOSIS — R05 Cough: Secondary | ICD-10-CM | POA: Diagnosis not present

## 2015-10-20 DIAGNOSIS — R5383 Other fatigue: Secondary | ICD-10-CM | POA: Diagnosis not present

## 2015-10-20 DIAGNOSIS — R053 Chronic cough: Secondary | ICD-10-CM

## 2015-10-20 MED ORDER — IOHEXOL 300 MG/ML  SOLN
80.0000 mL | Freq: Once | INTRAMUSCULAR | Status: AC | PRN
  Administered 2015-10-20: 80 mL via INTRAVENOUS

## 2015-10-26 ENCOUNTER — Other Ambulatory Visit (INDEPENDENT_AMBULATORY_CARE_PROVIDER_SITE_OTHER): Payer: BLUE CROSS/BLUE SHIELD

## 2015-10-26 DIAGNOSIS — D649 Anemia, unspecified: Secondary | ICD-10-CM

## 2015-10-26 DIAGNOSIS — R945 Abnormal results of liver function studies: Secondary | ICD-10-CM

## 2015-10-26 DIAGNOSIS — R7989 Other specified abnormal findings of blood chemistry: Secondary | ICD-10-CM | POA: Diagnosis not present

## 2015-10-26 LAB — CBC WITH DIFFERENTIAL/PLATELET
Basophils Absolute: 0 10*3/uL (ref 0.0–0.1)
Basophils Relative: 0.7 % (ref 0.0–3.0)
Eosinophils Absolute: 0 10*3/uL (ref 0.0–0.7)
Eosinophils Relative: 0.7 % (ref 0.0–5.0)
HCT: 34.6 % — ABNORMAL LOW (ref 36.0–46.0)
Hemoglobin: 11.4 g/dL — ABNORMAL LOW (ref 12.0–15.0)
Lymphocytes Relative: 19.9 % (ref 12.0–46.0)
Lymphs Abs: 1.3 10*3/uL (ref 0.7–4.0)
MCHC: 33 g/dL (ref 30.0–36.0)
MCV: 86.2 fl (ref 78.0–100.0)
Monocytes Absolute: 0.5 10*3/uL (ref 0.1–1.0)
Monocytes Relative: 7.4 % (ref 3.0–12.0)
Neutro Abs: 4.7 10*3/uL (ref 1.4–7.7)
Neutrophils Relative %: 71.3 % (ref 43.0–77.0)
Platelets: 407 10*3/uL — ABNORMAL HIGH (ref 150.0–400.0)
RBC: 4.02 Mil/uL (ref 3.87–5.11)
RDW: 15.3 % (ref 11.5–15.5)
WBC: 6.6 10*3/uL (ref 4.0–10.5)

## 2015-10-26 LAB — HEPATIC FUNCTION PANEL
ALT: 37 U/L — ABNORMAL HIGH (ref 0–35)
AST: 32 U/L (ref 0–37)
Albumin: 4.3 g/dL (ref 3.5–5.2)
Alkaline Phosphatase: 153 U/L — ABNORMAL HIGH (ref 39–117)
Bilirubin, Direct: 0.1 mg/dL (ref 0.0–0.3)
Total Bilirubin: 0.5 mg/dL (ref 0.2–1.2)
Total Protein: 7 g/dL (ref 6.0–8.3)

## 2015-11-19 ENCOUNTER — Ambulatory Visit (INDEPENDENT_AMBULATORY_CARE_PROVIDER_SITE_OTHER): Payer: BLUE CROSS/BLUE SHIELD | Admitting: Internal Medicine

## 2015-11-19 ENCOUNTER — Encounter: Payer: Self-pay | Admitting: Internal Medicine

## 2015-11-19 VITALS — BP 110/70 | HR 72 | Ht 66.0 in | Wt 124.4 lb

## 2015-11-19 DIAGNOSIS — K2100 Gastro-esophageal reflux disease with esophagitis, without bleeding: Secondary | ICD-10-CM

## 2015-11-19 NOTE — Progress Notes (Signed)
   Subjective:    Patient ID: [name] Case, female    DOB: 11-Jul-1961, 55 y.o.   MRN: [id number] Cc: f/u GERD HPI Doing well except for occasional heartburn on 60 Mg dexilant qd. That is only PPI that has seemed to work. ? If its ok to take long-term.  Medications, allergies, past medical history, past surgical history, family history and social history are reviewed and updated in the EMR.  Review of Systems As above    Objective:   Physical Exam BP 110/70 mmHg  Pulse 72  Ht 5\' 6"  (1.676 m)  Wt 124 lb 6 oz (56.416 kg)  BMI 20.08 kg/m2  LMP 11/28/2012 NAD    Assessment & Plan:   Encounter Diagnosis  Name Primary?  . Gastroesophageal reflux disease with esophagitis Yes   Will continue Dexilant at current dose RTC 2 yrs routine sooner prn - could also get PPI from PCP if possible but she requires prior auth so may need us.  I have reviewed the indications, risks and benefits of PPI therapy with the patient today. I have discussed studies that raise ? of increased osteoporosis, dementia and kidney failure and explained that these studies show very weak associations of unclear significance and not clear cause and effect. We have agreed to continue PPI treatment in this case.

## 2015-11-19 NOTE — Patient Instructions (Signed)
   Glad your doing well.    Please follow up with Dr Leone PayorGessner in 2 years or sooner if needed.     I appreciate the opportunity to care for you. Stan Headarl Gessner, MD, [facility]

## 2015-12-08 ENCOUNTER — Other Ambulatory Visit: Payer: Self-pay | Admitting: Internal Medicine

## 2015-12-08 DIAGNOSIS — F331 Major depressive disorder, recurrent, moderate: Secondary | ICD-10-CM | POA: Diagnosis not present

## 2015-12-28 DIAGNOSIS — L080 Pyoderma: Secondary | ICD-10-CM | POA: Diagnosis not present

## 2015-12-28 DIAGNOSIS — D485 Neoplasm of uncertain behavior of skin: Secondary | ICD-10-CM | POA: Diagnosis not present

## 2016-02-01 DIAGNOSIS — F331 Major depressive disorder, recurrent, moderate: Secondary | ICD-10-CM | POA: Diagnosis not present

## 2016-02-20 ENCOUNTER — Other Ambulatory Visit: Payer: Self-pay | Admitting: Family Medicine

## 2016-03-07 ENCOUNTER — Other Ambulatory Visit: Payer: Self-pay | Admitting: Internal Medicine

## 2016-03-13 ENCOUNTER — Other Ambulatory Visit: Payer: Self-pay | Admitting: Family Medicine

## 2016-03-13 DIAGNOSIS — Z1231 Encounter for screening mammogram for malignant neoplasm of breast: Secondary | ICD-10-CM

## 2016-03-17 ENCOUNTER — Ambulatory Visit (INDEPENDENT_AMBULATORY_CARE_PROVIDER_SITE_OTHER): Payer: BLUE CROSS/BLUE SHIELD | Admitting: Adult Health

## 2016-03-17 ENCOUNTER — Encounter: Payer: Self-pay | Admitting: Adult Health

## 2016-03-17 VITALS — BP 102/70 | Ht 66.0 in | Wt 124.2 lb

## 2016-03-17 DIAGNOSIS — Z23 Encounter for immunization: Secondary | ICD-10-CM | POA: Diagnosis not present

## 2016-03-17 DIAGNOSIS — T148 Other injury of unspecified body region: Secondary | ICD-10-CM | POA: Diagnosis not present

## 2016-03-17 DIAGNOSIS — W540XXA Bitten by dog, initial encounter: Secondary | ICD-10-CM

## 2016-03-17 MED ORDER — AMOXICILLIN-POT CLAVULANATE 875-125 MG PO TABS: 1.0000 | ORAL_TABLET | Freq: Two times a day (BID) | ORAL | 0 refills | Status: DC

## 2016-03-17 MED ORDER — TETANUS-DIPHTH-ACELL PERTUSSIS 5-2.5-18.5 LF-MCG/0.5 IM SUSP
0.5000 mL | Freq: Once | INTRAMUSCULAR | Status: AC
  Administered 2016-03-17: 0.5 mL via INTRAMUSCULAR

## 2016-03-17 NOTE — Progress Notes (Signed)
Subjective:    Patient ID: [name] Case, female    DOB: Dec 22, 1960, 55 y.o.   MRN: [id number]  HPI  55 year old healthy female, patient of Dr. Yong Channel. I am seeing her today for an acute issue of dog bite to the face. She reports that she was leaving the [place] game last night, while walking down the side walk she met a lady that was holding a small dog in her arms. [name] Case went to pet the dog and the dog lunged and bit [name] Case on the face. She reports that she does not know the dog and is unsure if the dog is UTD on vaccinations. She had minor bleeding. Denies any puncture wounds.   She is here due to needing a tetanus booster.   Review of Systems  Constitutional: Negative.   Skin: Positive for color change and wound.  All other systems reviewed and are negative.  Past Medical History:  Diagnosis Date  . Anemia   . Disorder of sebaceous glands    sebaceous neoplasm  . GERD 07/18/2007  . HYPOTHYROIDISM 07/18/2007  . MITRAL VALVE PROLAPSE 07/18/2007  . 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 Pill esophagitis 2007    Social History   Social History  . Marital status: Married    Spouse name: N/A  . Number of children: 2  . 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 Smoking status: Never Smoker  . Smokeless tobacco: Never Used  . Alcohol use 1.8 oz/week    3 Glasses of wine per week  . Drug use: No  . Sexual activity: Not on file   Other Topics Concern  . Not on file   Social History Narrative   Married. 2 children. No grandkids. English Bulldog and boston terrier. grandpet-rescue dog.       Engineer, building services for orthodontist      Hobbies: exercise, travel ([place] favorite spot)    Past Surgical History:  Procedure Laterality Date  . APPENDECTOMY    . COLONOSCOPY  multiple   Normal  . UPPER GASTROINTESTINAL ENDOSCOPY  02/08/2009   inflammatory nodule in antrum, 50  Fr Maloney dilation  . UPPER GASTROINTESTINAL ENDOSCOPY  2007x2   pill esophagitis/ulcer  . UPPER GASTROINTESTINAL ENDOSCOPY  2004   erosive esophagitis and mild esophageal stricture - dilated    Family History  Problem Relation Age of Onset  . Hypertension Brother   . Colon cancer Neg Hx   . Hypertension Father     Allergies  Allergen Reactions  . Sulfamethoxazole     REACTION: hives  . Tetracycline Hcl     REACTION: ulcers in throat    Current Outpatient Prescriptions on File Prior to Visit  Medication Sig Dispense Refill  . buPROPion (WELLBUTRIN XL) 300 MG 24 hr tablet Take 300 mg by mouth daily.    Marland Kitchen DEXILANT 60 MG capsule TAKE 1 CAPSULE BY MOUTH EVERY DAY 90 capsule 1  . levothyroxine (SYNTHROID, LEVOTHROID) 150 MCG tablet TAKE 1 TABLET(150 MCG) BY MOUTH DAILY 30 tablet 5   No current facility-administered medications on file prior to visit.     BP 102/70   Ht _0  (1.676 m)   Wt 124 lb 3.2 oz (56.3 kg)   BMI 20.05 kg/m       Objective:   Physical Exam  Constitutional: She is oriented to person, place, and time. She appears well-developed and well-nourished.  No distress.  Neurological: She is alert and oriented to person, place, and time.  Skin: Skin is warm and dry. No rash noted. She is not diaphoretic. No erythema.  Four superficial abrasions to left upper lip and one superficial abrasion to mid upper lip. No puncture wounds noted. No signs of infection. Bruising to upper lip    Psychiatric: She has a normal mood and affect. Her behavior is normal. Judgment and thought content normal.  Nursing note and vitals reviewed.     Assessment & Plan:  1. Dog bite - Will treat with antibiotics. Advised that if she had any concern about rabies then she should go to the ER or health department.  - amoxicillin-clavulanate (AUGMENTIN) 875-125 MG tablet; Take 1 tablet by mouth 2 (two) times daily.  Dispense: 14 tablet; Refill: 0 - Follow up with any signs of infection    2. Need for Tdap vaccination - Tdap (BOOSTRIX) injection 0.5 mL; Inject 0.5 mLs into the muscle once.   Dorothyann Peng, NP

## 2016-03-17 NOTE — Patient Instructions (Addendum)
It was great meeting you to day.   I have sent in a prescription for Augmentin. Please take this twice a day for 7 days  You can continue to use a light coat of neosporin if you would like  Follow up with any signs of infection.    Animal Bite Animal bites can range from mild to serious. An animal bite can result in a scratch on the skin, a deep open cut, a puncture of the skin, a crush injury, or tearing away of the skin or a body part. A small bite from a house pet will usually not cause serious problems. However, some animal bites can become infected or injure a bone or other tissue.  Bites from certain animals can be more dangerous because of the risk of spreading rabies, which is a serious viral infection. This risk is higher with bites from stray animals or wild animals, such as raccoons, foxes, skunks, and bats. Dogs are responsible for most animal bites. Children are bitten more often than adults. SYMPTOMS  Common symptoms of an animal bite include:   Pain.   Bleeding.   Swelling.   Bruising.  DIAGNOSIS  This condition may be diagnosed based on a physical exam and medical history. Your health care provider will examine the wound and ask for details about the animal and how the bite happened. You may also have tests, such as:   Blood tests to check for infection or to determine if surgery is needed.  X-rays to check for damage to bones or joints.  Culture test. This uses a sample of fluid from the wound to check for infection. TREATMENT  Treatment varies depending on the location and type of animal bite and your medical history. Treatment may include:   Wound care. This often includes cleaning the wound, flushing the wound with saline solution, and applying a bandage (dressing). Sometimes, the wound is left open to heal because of the high risk of infection. However, in some cases, the wound may be closed with stitches (sutures), staples, skin glue, or adhesive strips.    Antibiotic medicine.   Tetanus shot.   Rabies treatment if the animal could have rabies.  In some cases, bites that have become infected may require IV antibiotics and surgical treatment in the hospital.  HOME CARE INSTRUCTIONS Wound Care  Follow instructions from your health care provider about how to take care of your wound. Make sure you:  Wash your hands with soap and water before you change your dressing. If soap and water are not available, use hand sanitizer.  Change your dressing as told by your health care provider.  Leave sutures, skin glue, or adhesive strips in place. These skin closures may need to be in place for 2 weeks or longer. If adhesive strip edges start to loosen and curl up, you may trim the loose edges. Do not remove adhesive strips completely unless your health care provider tells you to do that.  Check your wound every day for signs of infection. Watch for:   Increasing redness, swelling, or pain.   Fluid, blood, or pus.  General Instructions  Take or apply over-the-counter and prescription medicines only as told by your health care provider.   If you were prescribed an antibiotic, take or apply it as told by your health care provider. Do not stop using the antibiotic even if your condition improves.   Keep the injured area raised (elevated) above the level of your heart while you are  sitting or lying down, if this is possible.   If directed, apply ice to the injured area.   Put ice in a plastic bag.   Place a towel between your skin and the bag.   Leave the ice on for 20 minutes, 2-3 times per day.   Keep all follow-up visits as told by your health care provider. This is important.  SEEK MEDICAL CARE IF:  You have increasing redness, swelling, or pain at the site of your wound.   You have a general feeling of sickness (malaise).   You feel nauseous or you vomit.   You have pain that does not get better.  SEEK  IMMEDIATE MEDICAL CARE IF:  You have a red streak extending away from your wound.   You have fluid, blood, or pus coming from your wound.   You have a fever or chills.   You have trouble moving your injured area.   You have numbness or tingling extending beyond the wound.   This information is not intended to replace advice given to you by your health care provider. Make sure you discuss any questions you have with your health care provider.   Document Released: 04/18/2011 Document Revised: 04/21/2015 Document Reviewed: 12/16/2014 Elsevier Interactive Patient Education [employer]

## 2016-03-24 ENCOUNTER — Ambulatory Visit
Admission: RE | Admit: 2016-03-24 | Discharge: 2016-03-24 | Disposition: A | Discharge status: Home / Self Care | Payer: BLUE CROSS/BLUE SHIELD | Source: Ambulatory Visit | Attending: Family Medicine | Admitting: Family Medicine

## 2016-03-24 DIAGNOSIS — Z1231 Encounter for screening mammogram for malignant neoplasm of breast: Secondary | ICD-10-CM | POA: Diagnosis not present

## 2016-04-27 ENCOUNTER — Other Ambulatory Visit: Payer: Self-pay | Admitting: Obstetrics & Gynecology

## 2016-04-27 ENCOUNTER — Other Ambulatory Visit (HOSPITAL_COMMUNITY)
Admission: RE | Admit: 2016-04-27 | Discharge: 2016-04-27 | Disposition: A | Discharge status: Home / Self Care | Payer: BLUE CROSS/BLUE SHIELD | Source: Ambulatory Visit | Attending: Obstetrics & Gynecology | Admitting: Obstetrics & Gynecology

## 2016-04-27 DIAGNOSIS — Z01419 Encounter for gynecological examination (general) (routine) without abnormal findings: Secondary | ICD-10-CM | POA: Insufficient documentation

## 2016-04-27 DIAGNOSIS — Z1151 Encounter for screening for human papillomavirus (HPV): Secondary | ICD-10-CM | POA: Insufficient documentation

## 2016-04-27 DIAGNOSIS — Z01411 Encounter for gynecological examination (general) (routine) with abnormal findings: Secondary | ICD-10-CM | POA: Diagnosis not present

## 2016-04-27 DIAGNOSIS — F331 Major depressive disorder, recurrent, moderate: Secondary | ICD-10-CM | POA: Diagnosis not present

## 2016-04-28 LAB — CYTOLOGY - PAP

## 2016-06-15 DIAGNOSIS — H10413 Chronic giant papillary conjunctivitis, bilateral: Secondary | ICD-10-CM | POA: Diagnosis not present

## 2016-06-15 DIAGNOSIS — H5203 Hypermetropia, bilateral: Secondary | ICD-10-CM | POA: Diagnosis not present

## 2016-06-29 DIAGNOSIS — M5136 Other intervertebral disc degeneration, lumbar region: Secondary | ICD-10-CM | POA: Diagnosis not present

## 2016-06-29 DIAGNOSIS — M7071 Other bursitis of hip, right hip: Secondary | ICD-10-CM | POA: Diagnosis not present

## 2016-07-14 DIAGNOSIS — M545 Low back pain: Secondary | ICD-10-CM | POA: Diagnosis not present

## 2016-07-21 DIAGNOSIS — M25551 Pain in right hip: Secondary | ICD-10-CM | POA: Diagnosis not present

## 2016-08-24 DIAGNOSIS — F331 Major depressive disorder, recurrent, moderate: Secondary | ICD-10-CM | POA: Diagnosis not present

## 2016-08-26 ENCOUNTER — Other Ambulatory Visit: Payer: Self-pay | Admitting: Internal Medicine

## 2016-08-26 ENCOUNTER — Other Ambulatory Visit: Payer: Self-pay | Admitting: Family Medicine

## 2016-09-12 DIAGNOSIS — N959 Unspecified menopausal and perimenopausal disorder: Secondary | ICD-10-CM | POA: Diagnosis not present

## 2016-10-06 DIAGNOSIS — D2362 Other benign neoplasm of skin of left upper limb, including shoulder: Secondary | ICD-10-CM | POA: Diagnosis not present

## 2016-11-25 ENCOUNTER — Other Ambulatory Visit: Payer: Self-pay | Admitting: Internal Medicine

## 2016-11-27 ENCOUNTER — Other Ambulatory Visit: Payer: Self-pay

## 2016-11-27 MED ORDER — DEXLANSOPRAZOLE 60 MG PO CPDR: 60.0000 mg | DELAYED_RELEASE_CAPSULE | Freq: Every day | ORAL | 1 refills | Status: DC

## 2016-11-27 NOTE — Telephone Encounter (Signed)
Dexilant refilled to [facility].

## 2016-12-23 ENCOUNTER — Other Ambulatory Visit: Payer: Self-pay | Admitting: Family Medicine

## 2017-02-01 DIAGNOSIS — F331 Major depressive disorder, recurrent, moderate: Secondary | ICD-10-CM | POA: Diagnosis not present

## 2017-02-01 DIAGNOSIS — F411 Generalized anxiety disorder: Secondary | ICD-10-CM | POA: Diagnosis not present

## 2017-02-28 ENCOUNTER — Other Ambulatory Visit: Payer: Self-pay | Admitting: Family Medicine

## 2017-02-28 DIAGNOSIS — Z1231 Encounter for screening mammogram for malignant neoplasm of breast: Secondary | ICD-10-CM

## 2017-03-08 ENCOUNTER — Encounter: Payer: Self-pay | Admitting: Family Medicine

## 2017-03-08 ENCOUNTER — Ambulatory Visit (INDEPENDENT_AMBULATORY_CARE_PROVIDER_SITE_OTHER): Payer: BLUE CROSS/BLUE SHIELD | Admitting: Family Medicine

## 2017-03-08 VITALS — BP 106/78 | HR 80 | Temp 98.0°F | Ht 66.0 in | Wt 121.0 lb

## 2017-03-08 DIAGNOSIS — E034 Atrophy of thyroid (acquired): Secondary | ICD-10-CM

## 2017-03-08 DIAGNOSIS — R945 Abnormal results of liver function studies: Secondary | ICD-10-CM

## 2017-03-08 DIAGNOSIS — R221 Localized swelling, mass and lump, neck: Secondary | ICD-10-CM

## 2017-03-08 DIAGNOSIS — R7989 Other specified abnormal findings of blood chemistry: Secondary | ICD-10-CM | POA: Diagnosis not present

## 2017-03-08 LAB — CBC WITH DIFFERENTIAL/PLATELET
Basophils Absolute: 0.1 10*3/uL (ref 0.0–0.1)
Basophils Relative: 0.9 % (ref 0.0–3.0)
Eosinophils Absolute: 0.1 10*3/uL (ref 0.0–0.7)
Eosinophils Relative: 1.3 % (ref 0.0–5.0)
HCT: 36.9 % (ref 36.0–46.0)
Hemoglobin: 12 g/dL (ref 12.0–15.0)
Lymphocytes Relative: 26.4 % (ref 12.0–46.0)
Lymphs Abs: 1.7 10*3/uL (ref 0.7–4.0)
MCHC: 32.6 g/dL (ref 30.0–36.0)
MCV: 88.1 fl (ref 78.0–100.0)
Monocytes Absolute: 0.5 10*3/uL (ref 0.1–1.0)
Monocytes Relative: 7.1 % (ref 3.0–12.0)
Neutro Abs: 4.2 10*3/uL (ref 1.4–7.7)
Neutrophils Relative %: 64.3 % (ref 43.0–77.0)
Platelets: 294 10*3/uL (ref 150.0–400.0)
RBC: 4.18 Mil/uL (ref 3.87–5.11)
RDW: 15.2 % (ref 11.5–15.5)
WBC: 6.5 10*3/uL (ref 4.0–10.5)

## 2017-03-08 LAB — COMPREHENSIVE METABOLIC PANEL
ALT: 34 U/L (ref 0–35)
AST: 36 U/L (ref 0–37)
Albumin: 4.4 g/dL (ref 3.5–5.2)
Alkaline Phosphatase: 101 U/L (ref 39–117)
BUN: 13 mg/dL (ref 6–23)
CO2: 29 mEq/L (ref 19–32)
Calcium: 10.2 mg/dL (ref 8.4–10.5)
Chloride: 102 mEq/L (ref 96–112)
Creatinine, Ser: 0.79 mg/dL (ref 0.40–1.20)
GFR: 80.37 mL/min (ref >60.00)
Glucose, Bld: 81 mg/dL (ref 70–99)
Potassium: 4.1 mEq/L (ref 3.5–5.1)
Sodium: 139 mEq/L (ref 135–145)
Total Bilirubin: 0.5 mg/dL (ref 0.2–1.2)
Total Protein: 7.3 g/dL (ref 6.0–8.3)

## 2017-03-08 LAB — TSH: TSH: 2.64 u[IU]/mL (ref 0.35–4.50)

## 2017-03-08 NOTE — Patient Instructions (Signed)
Please stop by lab before you go  Let us know if any new or worsening symptoms such as trouble swallowing food  We will call you within a week or two about your referral for ultrasound of neck. If you do not hear within 3 weeks, give us a call.

## 2017-03-08 NOTE — Progress Notes (Signed)
Subjective:  [name] Case is a 56 y.o. year old very pleasant female patient who presents for/with See problem oriented charting ROS-no fevers, chills, fatigue/malaise, nausea/vomiting, or recent weight change particularly unintentional weigh tloss  Past Medical History-  Patient Active Problem List   Diagnosis Date Noted  . Anxiety 10/10/2010    Priority: Medium  . Hypothyroidism 07/18/2007    Priority: Medium  . Dysphagia 02/24/2011    Priority: Low  . Mitral valve prolapse 07/18/2007    Priority: Low  . GERD 07/18/2007    Priority: Low    Medications- reviewed and updated Current Outpatient Prescriptions  Medication Sig Dispense Refill  . buPROPion (WELLBUTRIN XL) 300 MG 24 hr tablet Take 300 mg by mouth daily.    Marland Kitchen. dexlansoprazole (DEXILANT) 60 MG capsule Take 1 capsule (60 mg total) by mouth daily. 90 capsule 1  . levothyroxine (SYNTHROID, LEVOTHROID) 150 MCG tablet TAKE 1 TABLET(150 MCG) BY MOUTH DAILY 90 tablet 0  . zolpidem (AMBIEN) 10 MG tablet Take 10 mg by mouth at bedtime as needed for sleep.     No current facility-administered medications for this visit.     Objective: BP 106/78 (BP Location: Left Arm, Patient Position: Sitting, Cuff Size: Normal)   Pulse 80   Temp 98 F (36.7 C) (Oral)   Ht 5\' 6"  (1.676 m)   Wt 121 lb (54.9 kg)   SpO2 97%   BMI 19.53 kg/m  Gen: NAD, resting comfortably Patient has mild asymmetry on left compared to right side of lower portion of thyroid cartilage- no obvious mass noted- patient reports nodular area which as noted I only note as slightly enlarged compared to right. No thyromegaly CV: RRR no murmurs rubs or gallops Lungs: CTAB no crackles, wheeze, rhonchi Ext: no edema Skin: warm, dry  Assessment/Plan:  Nodule of neck - Plan: CBC with Differential/Platelet, Comprehensive metabolic panel, US Soft Tissue Head/Neck  Hypothyroidism due to acquired atrophy of thyroid - Plan: TSH  Elevated LFTs - Plan: Comprehensive  metabolic panel S: feels a fullness in left side of throat with swallowing. No dysphagia or odynophagia. Noted this after noted small nodule on left side of neck- approximately a week ago. Has not noted any growth since first noted it.   Underactive thyroid on levothyroxine 150mcg at baseline. Last TSh about 16 months ago was normal. Father has had nodule removed but was benign.   A/P: Area of enlargement is above thyroid. I think the left side of her thyroid cartilage is just slightly larger. She perceives nodular area. We will get ultrasound of the neck. Also update TSH. Mild LFT elevation last visit and we will update as drawing labs. No red flags today  Orders Placed This Encounter  Procedures  . US Soft Tissue Head/Neck    Wt-121/no needs/ bcbs/ ls and pt w/ epic order     Standing Status:   Future    Standing Expiration Date:   05/09/2018    Scheduling Instructions:     Can choose whichever location is preferred- does not have to be [facility]    Order Specific Question:   Reason for Exam (SYMPTOM  OR DIAGNOSIS REQUIRED)    Answer:   patient notes nodule on left neck. appears to be over lower side cricoid cartilage- slight asymmetry when compared to right. feels fullness in throat. no obvious thyroid nodules (above this)    Order Specific Question:   Preferred imaging location?    Answer:   [facility]  .  CBC with Differential/Platelet  . Comprehensive metabolic panel    [place]  . TSH    [place]   Return precautions advised. Only when necessary follow-up plan for this acute issue. Tana ConchStephen Aviendha Azbell, MD

## 2017-03-16 ENCOUNTER — Ambulatory Visit
Admission: RE | Admit: 2017-03-16 | Discharge: 2017-03-16 | Disposition: A | Discharge status: Home / Self Care | Payer: BLUE CROSS/BLUE SHIELD | Source: Ambulatory Visit | Attending: Family Medicine | Admitting: Family Medicine

## 2017-03-16 DIAGNOSIS — R221 Localized swelling, mass and lump, neck: Secondary | ICD-10-CM

## 2017-03-24 ENCOUNTER — Other Ambulatory Visit: Payer: Self-pay | Admitting: Family Medicine

## 2017-03-30 ENCOUNTER — Ambulatory Visit
Admission: RE | Admit: 2017-03-30 | Discharge: 2017-03-30 | Disposition: A | Discharge status: Home / Self Care | Payer: BLUE CROSS/BLUE SHIELD | Source: Ambulatory Visit | Attending: Family Medicine | Admitting: Family Medicine

## 2017-03-30 DIAGNOSIS — Z1231 Encounter for screening mammogram for malignant neoplasm of breast: Secondary | ICD-10-CM

## 2017-04-05 DIAGNOSIS — F411 Generalized anxiety disorder: Secondary | ICD-10-CM | POA: Diagnosis not present

## 2017-04-05 DIAGNOSIS — F331 Major depressive disorder, recurrent, moderate: Secondary | ICD-10-CM | POA: Diagnosis not present

## 2017-04-30 ENCOUNTER — Emergency Department (HOSPITAL_COMMUNITY): Payer: BLUE CROSS/BLUE SHIELD

## 2017-04-30 ENCOUNTER — Observation Stay (HOSPITAL_COMMUNITY): Payer: BLUE CROSS/BLUE SHIELD

## 2017-04-30 ENCOUNTER — Inpatient Hospital Stay (HOSPITAL_COMMUNITY)
Admission: EM | Admit: 2017-04-30 | Discharge: 2017-05-02 | DRG: 312 | Disposition: A | Discharge status: Home / Self Care | Payer: BLUE CROSS/BLUE SHIELD | Attending: Internal Medicine | Admitting: Internal Medicine

## 2017-04-30 ENCOUNTER — Encounter (HOSPITAL_COMMUNITY): Payer: Self-pay | Admitting: Emergency Medicine

## 2017-04-30 DIAGNOSIS — G459 Transient cerebral ischemic attack, unspecified: Secondary | ICD-10-CM

## 2017-04-30 DIAGNOSIS — E876 Hypokalemia: Secondary | ICD-10-CM | POA: Diagnosis present

## 2017-04-30 DIAGNOSIS — G5603 Carpal tunnel syndrome, bilateral upper limbs: Secondary | ICD-10-CM | POA: Diagnosis present

## 2017-04-30 DIAGNOSIS — R202 Paresthesia of skin: Secondary | ICD-10-CM | POA: Diagnosis not present

## 2017-04-30 DIAGNOSIS — F410 Panic disorder [episodic paroxysmal anxiety] without agoraphobia: Secondary | ICD-10-CM | POA: Diagnosis present

## 2017-04-30 DIAGNOSIS — E039 Hypothyroidism, unspecified: Secondary | ICD-10-CM | POA: Diagnosis not present

## 2017-04-30 DIAGNOSIS — I341 Nonrheumatic mitral (valve) prolapse: Secondary | ICD-10-CM | POA: Diagnosis not present

## 2017-04-30 DIAGNOSIS — M50221 Other cervical disc displacement at C4-C5 level: Secondary | ICD-10-CM | POA: Diagnosis not present

## 2017-04-30 DIAGNOSIS — F411 Generalized anxiety disorder: Secondary | ICD-10-CM | POA: Diagnosis not present

## 2017-04-30 DIAGNOSIS — R079 Chest pain, unspecified: Secondary | ICD-10-CM | POA: Diagnosis not present

## 2017-04-30 DIAGNOSIS — R9431 Abnormal electrocardiogram [ECG] [EKG]: Secondary | ICD-10-CM | POA: Diagnosis not present

## 2017-04-30 DIAGNOSIS — E785 Hyperlipidemia, unspecified: Secondary | ICD-10-CM | POA: Diagnosis present

## 2017-04-30 DIAGNOSIS — K219 Gastro-esophageal reflux disease without esophagitis: Secondary | ICD-10-CM | POA: Diagnosis not present

## 2017-04-30 DIAGNOSIS — R55 Syncope and collapse: Secondary | ICD-10-CM | POA: Diagnosis present

## 2017-04-30 DIAGNOSIS — R200 Anesthesia of skin: Secondary | ICD-10-CM | POA: Diagnosis present

## 2017-04-30 DIAGNOSIS — R0602 Shortness of breath: Secondary | ICD-10-CM | POA: Diagnosis not present

## 2017-04-30 DIAGNOSIS — I6789 Other cerebrovascular disease: Secondary | ICD-10-CM | POA: Diagnosis not present

## 2017-04-30 DIAGNOSIS — R061 Stridor: Secondary | ICD-10-CM | POA: Diagnosis not present

## 2017-04-30 HISTORY — DX: Transient cerebral ischemic attack, unspecified: G45.9

## 2017-04-30 HISTORY — DX: Anxiety disorder, unspecified: F41.9

## 2017-04-30 HISTORY — DX: Anesthesia of skin: R20.0

## 2017-04-30 HISTORY — DX: Cardiac murmur, unspecified: R01.1

## 2017-04-30 LAB — CBC WITH DIFFERENTIAL/PLATELET
Basophils Absolute: 0.1 10*3/uL (ref 0.0–0.1)
Basophils Relative: 1 %
Eosinophils Absolute: 0.1 10*3/uL (ref 0.0–0.7)
Eosinophils Relative: 1 %
HCT: 34.1 % — ABNORMAL LOW (ref 36.0–46.0)
Hemoglobin: 11.2 g/dL — ABNORMAL LOW (ref 12.0–15.0)
Lymphocytes Relative: 19 %
Lymphs Abs: 1.6 10*3/uL (ref 0.7–4.0)
MCH: 28 pg (ref 26.0–34.0)
MCHC: 32.8 g/dL (ref 30.0–36.0)
MCV: 85.3 fL (ref 78.0–100.0)
Monocytes Absolute: 0.8 10*3/uL (ref 0.1–1.0)
Monocytes Relative: 10 %
Neutro Abs: 5.8 10*3/uL (ref 1.7–7.7)
Neutrophils Relative %: 69 %
Platelets: 266 10*3/uL (ref 150–400)
RBC: 4 MIL/uL (ref 3.87–5.11)
RDW: 13.5 % (ref 11.5–15.5)
WBC: 8.3 10*3/uL (ref 4.0–10.5)

## 2017-04-30 LAB — COMPREHENSIVE METABOLIC PANEL
ALT: 28 U/L (ref 14–54)
AST: 32 U/L (ref 15–41)
Albumin: 4.2 g/dL (ref 3.5–5.0)
Alkaline Phosphatase: 95 U/L (ref 38–126)
Anion gap: 10 (ref 5–15)
BUN: 17 mg/dL (ref 6–20)
CO2: 25 mmol/L (ref 22–32)
Calcium: 9.3 mg/dL (ref 8.9–10.3)
Chloride: 106 mmol/L (ref 101–111)
Creatinine, Ser: 0.8 mg/dL (ref 0.44–1.00)
GFR calc Af Amer: >60 mL/min (ref >60)
GFR calc non Af Amer: >60 mL/min (ref >60)
Glucose, Bld: 84 mg/dL (ref 65–99)
Potassium: 3.1 mmol/L — ABNORMAL LOW (ref 3.5–5.1)
Sodium: 141 mmol/L (ref 135–145)
Total Bilirubin: 0.7 mg/dL (ref 0.3–1.2)
Total Protein: 7.1 g/dL (ref 6.5–8.1)

## 2017-04-30 LAB — TROPONIN I
Troponin I: <0.03 ng/mL (ref <0.03)
Troponin I: <0.03 ng/mL (ref <0.03)

## 2017-04-30 MED ORDER — INFLUENZA VAC SPLIT QUAD 0.5 ML IM SUSY
0.5000 mL | PREFILLED_SYRINGE | INTRAMUSCULAR | Status: AC
  Administered 2017-05-01: 0.5 mL via INTRAMUSCULAR
  Filled 2017-04-30: qty 0.5

## 2017-04-30 MED ORDER — ASPIRIN 325 MG PO TABS
325.0000 mg | ORAL_TABLET | Freq: Every day | ORAL | Status: DC
  Administered 2017-04-30 – 2017-05-02 (×3): 325 mg via ORAL
  Filled 2017-04-30 (×2): qty 1

## 2017-04-30 MED ORDER — LORAZEPAM 2 MG/ML IJ SOLN
1.0000 mg | Freq: Four times a day (QID) | INTRAMUSCULAR | Status: DC | PRN
  Administered 2017-05-01 – 2017-05-02 (×2): 1 mg via INTRAVENOUS
  Filled 2017-04-30 (×2): qty 1

## 2017-04-30 MED ORDER — LEVOTHYROXINE SODIUM 75 MCG PO TABS
150.0000 ug | ORAL_TABLET | Freq: Every day | ORAL | Status: DC
  Administered 2017-05-01 – 2017-05-02 (×2): 150 ug via ORAL
  Filled 2017-04-30 (×2): qty 2

## 2017-04-30 MED ORDER — SODIUM CHLORIDE 0.9 % IV SOLN
INTRAVENOUS | Status: DC
  Administered 2017-04-30 – 2017-05-02 (×3): via INTRAVENOUS

## 2017-04-30 MED ORDER — POTASSIUM CHLORIDE CRYS ER 20 MEQ PO TBCR
40.0000 meq | EXTENDED_RELEASE_TABLET | Freq: Once | ORAL | Status: AC
  Administered 2017-04-30: 40 meq via ORAL
  Filled 2017-04-30: qty 2

## 2017-04-30 MED ORDER — BUPROPION HCL ER (XL) 300 MG PO TB24
300.0000 mg | ORAL_TABLET | Freq: Every day | ORAL | Status: DC
  Administered 2017-05-01 – 2017-05-02 (×2): 300 mg via ORAL
  Filled 2017-04-30 (×2): qty 1

## 2017-04-30 MED ORDER — PANTOPRAZOLE SODIUM 40 MG PO TBEC
40.0000 mg | DELAYED_RELEASE_TABLET | Freq: Every day | ORAL | Status: DC
  Administered 2017-05-01: 40 mg via ORAL
  Filled 2017-04-30: qty 1

## 2017-04-30 MED ORDER — ENOXAPARIN SODIUM 40 MG/0.4ML ~~LOC~~ SOLN
40.0000 mg | SUBCUTANEOUS | Status: DC
  Filled 2017-04-30: qty 0.4

## 2017-04-30 MED ORDER — ASPIRIN 325 MG PO TABS
ORAL_TABLET | ORAL | Status: AC
  Administered 2017-04-30: 325 mg via ORAL
  Filled 2017-04-30: qty 1

## 2017-04-30 NOTE — H&P (Addendum)
History and Physical  [name] Case:[id number] DOB: 1961/05/28 DOA: 04/30/2017  PCP:  Shelva Majestic, MD   Chief Complaint:  Left arm numbness Syncope   History of Present Illness:  Pt is a 56 yo female with hx of anxiety who was in her usual state of health today sitting at her desk trying to send an email and suddenly had a syncopal episode of a few seconds followed by a brief period of confusion. A coworker told family that the patient had left arm tucked close to her chest in a weird position but was not shaking. No urine/stool incontinence. EMS was called and en route while nurse was trying to place an IV pt got anxiety attack which resolved in the ED. She denied any symptoms prior to that. No chest pain/palpitations/dyspnea.   Review of Systems:  CONSTITUTIONAL:     No night sweats.  No fatigue.  No fever. No chills. Eyes:                            No visual changes.  No eye pain.  No eye discharge.   ENT:                              No epistaxis.  No sinus pain.  No sore throat.   No congestion. RESPIRATORY:           No cough.  No wheeze.  No hemoptysis.  No dyspnea CARDIOVASCULAR   :  No chest pains.  No palpitations. GASTROINTESTINAL:  No abdominal pain.  No nausea. No vomiting.  No diarrhea. No  constipation.  No hematemesis.  No hematochezia.  No melena. GENITOURINARY:      No urgency.  No frequency.  No dysuria.  No hematuria.  No obstructive symptoms.  No discharge.  No pain.   MUSCULOSKELETAL:  No musculoskeletal pain.  No joint swelling.  No arthritis. NEUROLOGICAL:        No confusion.  No weakness. No headache. No seizure. PSYCHIATRIC:             No depression. No anxiety. No suicidal ideation. SKIN:                             No rashes.  No lesions.  No wounds. ENDOCRINE:                No weight loss.  No polydipsia.  No polyuria.  No polyphagia. HEMATOLOGIC:           No purpura.  No petechiae.  No bleeding.  ALLERGIC                 : No  pruritus.  No angioedema Other:  Past Medical and Surgical History:   Past Medical History:  Diagnosis Date  . Anemia   . Anxiety   . Disorder of sebaceous glands    sebaceous neoplasm  . GERD 07/18/2007  . HYPOTHYROIDISM 07/18/2007  . MITRAL VALVE PROLAPSE 07/18/2007  . 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 Pill esophagitis 2007   Past Surgical History:  Procedure Laterality Date  . APPENDECTOMY    . COLONOSCOPY  multiple   Normal  . UPPER GASTROINTESTINAL ENDOSCOPY  02/08/2009   inflammatory nodule in antrum, 50 Fr Maloney dilation  . UPPER GASTROINTESTINAL ENDOSCOPY  2007x2   pill esophagitis/ulcer  .  UPPER GASTROINTESTINAL ENDOSCOPY  2004   erosive esophagitis and mild esophageal stricture - dilated    Social History:   reports that she has never smoked. She has never used smokeless tobacco. She reports that she drinks about 1.8 oz of alcohol per week . She reports that she does not use drugs.   Allergies  Allergen Reactions  . Sulfamethoxazole     REACTION: hives  . Tetracycline Hcl     REACTION: ulcers in throat    Family History  Problem Relation Age of Onset  . Hypertension Brother   . Hypertension Father   . Colon cancer Neg Hx   . Breast cancer Neg Hx       Prior to Admission medications   Medication Sig Start Date End Date Taking? Authorizing Provider  buPROPion (WELLBUTRIN XL) 300 MG 24 hr tablet Take 300 mg by mouth daily.   Yes [provider]  dexlansoprazole (DEXILANT) 60 MG capsule Take 1 capsule (60 mg total) by mouth daily. 11/27/16  Yes Iva Boop, MD  levothyroxine (SYNTHROID, LEVOTHROID) 150 MCG tablet TAKE 1 TABLET BY MOUTH DAILY 03/27/17  Yes Shelva Majestic, MD  naproxen sodium (ANAPROX) 220 MG tablet Take 440 mg by mouth daily as needed (back pain).   Yes [provider]    Physical Exam: BP 136/81   Pulse 76   Temp 98.1 F (36.7 C) (Oral)   Resp 16   LMP 11/28/2012   SpO2 100%   GENERAL :    Alert and cooperative, and appears to be in no acute distress. HEAD:           normocephalic. EYES:            PERRL, EOMI.  vision is grossly intact. EARS:           hearing grossly intact. NECK:          supple, non-tender.  CARDIAC:    Normal S1 and S2. No gallop. No murmurs.  Vascular:     no peripheral edema.  LUNGS:       Clear to auscultation  ABDOMEN: Positive bowel sounds. Soft, nondistended, nontender. No guarding or rebound.      MSK:           No joint erythema or tenderness.  EXT           : No significant deformity or joint abnormality. Neuro        : Alert, oriented to person, place, and time.                      CN II-XII intact.  SKIN:            No rash. No lesions. PSYCH:       No hallucination. Patient is not suicidal.          Labs on Admission:  Reviewed.   Radiological Exams on Admission: Dg Chest 2 View  Result Date: 04/30/2017 CLINICAL DATA:  Syncope EXAM: CHEST  2 VIEW COMPARISON:  CT chest dated 10/20/2015 FINDINGS: Lungs are clear.  No pleural effusion or pneumothorax. The heart is normal in size. Visualized osseous structures are within normal limits. IMPRESSION: Normal chest radiographs. Electronically Signed   By: Charline Bills M.D.   On: 04/30/2017 13:45    EKG:  Independently reviewed. No specific ST/T changes   Assessment/Plan  Syncope:  Etiology is not clear to me but in ddx : seizure ( confusion after passing  out, numbness in left arm, tonic posture) : will consult neuro, get EEG, check MRI brain and cervical to eval for persistent left arm numbness. ACS r/o with serial trops and EKG although less likely. arrythemia is possible;will keep no Tele and consult cardiology. Lastly anxiety attack is possible but that happened after the event described in H&P.  Hypothyroidism: cont synthroid   Anxiety: cont home meds  Hypokalemia: replaced, will monitor   Input & Output: nA Lines & Tubes:PIV DVT prophylaxis: Adams enoxaparin  GI prophylaxis:  NA Consultants: Neuro/Cardio Code Status: Full  Family Communication: at bedside  Disposition Plan: TBD  talkd to Dr.Arora. He said for possible first time seizure, no medications is needed. We will arrange for neuro outpatient f/u on discharge pending MRI/EEG.   Eston Esters M.D [facility]

## 2017-04-30 NOTE — ED Notes (Signed)
Bed: WTR6 Expected date:  Expected time:  Means of arrival:  Comments: 

## 2017-04-30 NOTE — ED Notes (Signed)
Bed: WLPT1 Expected date:  Expected time:  Means of arrival:  Comments: 

## 2017-04-30 NOTE — ED Triage Notes (Signed)
Per EMS-states anxiety-was given 2.5 mg of Versed in route-states patient was at work-states she couldn't focus-was hyperventilating-states hands and feet tingling-states upper extremities stiff and "posturing"-CBG 102-placed on non-rebreather-

## 2017-04-30 NOTE — ED Notes (Signed)
Call report to Katie [phone] at [phone].

## 2017-04-30 NOTE — ED Notes (Signed)
ED TO INPATIENT HANDOFF REPORT  Name/Age/Gender [name] Case 56 y.o. female  Code Status    Code Status Orders        Start     Ordered   04/30/17 1548  Full code  Continuous     04/30/17 1547    Code Status History    Date Active Date Inactive Code Status Order ID Comments User Context   This patient has a current code status but no historical code status.      Home/SNF/Other Home  Chief Complaint anxiety   Level of Care/Admitting Diagnosis ED Disposition    ED Disposition Condition Comment   Admit  Hospital Area: [place] [[id number]]  Level of Care: Telemetry [5]  Admit to tele based on following criteria: Monitor QTC interval  Admit to tele based on following criteria: Monitor for Ischemic changes  Diagnosis: Left arm numbness [416384]  Admitting Physician: Gennaro Africa [[id number]]  Attending Physician: Gennaro Africa [[id number]]  PT Class (Do Not Modify): Observation [104]  PT Acc Code (Do Not Modify): Observation [10022]       Medical History Past Medical History:  Diagnosis Date  . Anemia   . Anxiety   . Disorder of sebaceous glands    sebaceous neoplasm  . GERD 07/18/2007  . HYPOTHYROIDISM 07/18/2007  . MITRAL VALVE PROLAPSE 07/18/2007  . 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 Pill esophagitis 2007    Allergies Allergies  Allergen Reactions  . Sulfamethoxazole     REACTION: hives  . Tetracycline Hcl     REACTION: ulcers in throat    IV Location/Drains/Wounds Patient Lines/Drains/Airways Status   Active Line/Drains/Airways    Name:   Placement date:   Placement time:   Site:   Days:   Peripheral IV 04/30/17 Left Wrist  04/30/17        Wrist    less than 1          Labs/Imaging Results for orders placed or performed during the hospital encounter of 04/30/17 (from the past 48 hour(s))  Comprehensive metabolic panel     Status: Abnormal   Collection Time: 04/30/17 12:55 PM  Result Value Ref Range   Sodium 141  135 - 145 mmol/L   Potassium 3.1 (L) 3.5 - 5.1 mmol/L   Chloride 106 101 - 111 mmol/L   CO2 25 22 - 32 mmol/L   Glucose, Bld 84 65 - 99 mg/dL   BUN 17 6 - 20 mg/dL   Creatinine, Ser 0.80 0.44 - 1.00 mg/dL   Calcium 9.3 8.9 - 10.3 mg/dL   Total Protein 7.1 6.5 - 8.1 g/dL   Albumin 4.2 3.5 - 5.0 g/dL   AST 32 15 - 41 U/L   ALT 28 14 - 54 U/L   Alkaline Phosphatase 95 38 - 126 U/L   Total Bilirubin 0.7 0.3 - 1.2 mg/dL   GFR calc non Af Amer >60 >60 mL/min   GFR calc Af Amer >60 >60 mL/min    Comment: (NOTE) The eGFR has been calculated using the CKD EPI equation. This calculation has not been validated in all clinical situations. eGFR's persistently <60 mL/min signify possible Chronic Kidney Disease.    Anion gap 10 5 - 15  CBC with Differential/Platelet     Status: Abnormal   Collection Time: 04/30/17 12:55 PM  Result Value Ref Range   WBC 8.3 4.0 - 10.5 K/uL   RBC 4.00 3.87 - 5.11 MIL/uL   Hemoglobin 11.2 (L) 12.0 -  15.0 g/dL   HCT 34.1 (L) 36.0 - 46.0 %   MCV 85.3 78.0 - 100.0 fL   MCH 28.0 26.0 - 34.0 pg   MCHC 32.8 30.0 - 36.0 g/dL   RDW 13.5 11.5 - 15.5 %   Platelets 266 150 - 400 K/uL   Neutrophils Relative % 69 %   Neutro Abs 5.8 1.7 - 7.7 K/uL   Lymphocytes Relative 19 %   Lymphs Abs 1.6 0.7 - 4.0 K/uL   Monocytes Relative 10 %   Monocytes Absolute 0.8 0.1 - 1.0 K/uL   Eosinophils Relative 1 %   Eosinophils Absolute 0.1 0.0 - 0.7 K/uL   Basophils Relative 1 %   Basophils Absolute 0.1 0.0 - 0.1 K/uL  Troponin I     Status: None   Collection Time: 04/30/17 12:55 PM  Result Value Ref Range   Troponin I <0.03 <0.03 ng/mL   Dg Chest 2 View  Result Date: 04/30/2017 CLINICAL DATA:  Syncope EXAM: CHEST  2 VIEW COMPARISON:  CT chest dated 10/20/2015 FINDINGS: Lungs are clear.  No pleural effusion or pneumothorax. The heart is normal in size. Visualized osseous structures are within normal limits. IMPRESSION: Normal chest radiographs. Electronically Signed   By:  Julian Hy M.D.   On: 04/30/2017 13:45    Pending Labs Unresulted Labs    Start     Ordered   05/01/17 0500  Lipid panel  Tomorrow morning,   R     04/30/17 1439   05/01/17 0500  Hemoglobin A1c  Tomorrow morning,   R     04/30/17 1439   05/01/17 0500  HIV antibody (Routine Testing)  Tomorrow morning,   R     04/30/17 1547   05/01/17 9326  Basic metabolic panel  Tomorrow morning,   R     04/30/17 1547   04/30/17 2350  Troponin I  Once,   R     04/30/17 1439   04/30/17 1800  Troponin I  Once,   R     04/30/17 1439      Vitals/Pain Today's Vitals   04/30/17 1400 04/30/17 1456 04/30/17 1500 04/30/17 1552  BP: 128/81 128/81 136/81 (!) 152/81  Pulse: 74 78 76 93  Resp: 14 (!) _0 Temp:    98.3 F (36.8 C)  TempSrc:    Oral  SpO2: 100% 100% 100% 98%    Isolation Precautions No active isolations  Medications Medications  0.9 %  sodium chloride infusion ( Intravenous New Bag/Given 04/30/17 1303)  aspirin tablet 325 mg (325 mg Oral Given 04/30/17 1303)  buPROPion (WELLBUTRIN XL) 24 hr tablet 300 mg (not administered)  pantoprazole (PROTONIX) EC tablet 40 mg (not administered)  levothyroxine (SYNTHROID, LEVOTHROID) tablet 150 mcg (not administered)  LORazepam (ATIVAN) injection 1 mg (not administered)  enoxaparin (LOVENOX) injection 40 mg (not administered)  Influenza vac split quadrivalent PF (FLUARIX) injection 0.5 mL (not administered)  potassium chloride SA (K-DUR,KLOR-CON) CR tablet 40 mEq (40 mEq Oral Given 04/30/17 1502)    Mobility walks

## 2017-04-30 NOTE — ED Provider Notes (Signed)
[facility]-EMERGENCY DEPT Provider Note   CSN: [id number] Arrival date & time: 04/30/17  1015     History   Chief Complaint Chief Complaint  Patient presents with  . Panic Attack    HPI [name] Case is a 56 y.o. female.  56 year old female presents after having acute onset of not feeling well which she characterizes weakness and left arm tingling as well as diaphoresis. Was not dyspneic and did not have any chest pain or chest pressure. She is unsure how long the symptoms lasted for but afterwards she admitted that she did have a panic attack. States that she has been under less stress and anxiety recently but today she felt fine prior to this event. Denies any prior history of cardiac disease. No diabetes or hypertension. EMS was called and patient was given Versed her symptoms. She had a CBG of 102 when she was transported here.      Past Medical History:  Diagnosis Date  . Anemia   . Anxiety   . Disorder of sebaceous glands    sebaceous neoplasm  . GERD 07/18/2007  . HYPOTHYROIDISM 07/18/2007  . MITRAL VALVE PROLAPSE 07/18/2007  . 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 Pill esophagitis 2007    Patient Active Problem List   Diagnosis Date Noted  . Dysphagia 02/24/2011  . Anxiety 10/10/2010  . Hypothyroidism 07/18/2007  . Mitral valve prolapse 07/18/2007  . GERD 07/18/2007    Past Surgical History:  Procedure Laterality Date  . APPENDECTOMY    . COLONOSCOPY  multiple   Normal  . UPPER GASTROINTESTINAL ENDOSCOPY  02/08/2009   inflammatory nodule in antrum, 50 Fr Maloney dilation  . UPPER GASTROINTESTINAL ENDOSCOPY  2007x2   pill esophagitis/ulcer  . UPPER GASTROINTESTINAL ENDOSCOPY  2004   erosive esophagitis and mild esophageal stricture - dilated    OB History    No data available       Home Medications    Prior to Admission medications   Medication Sig Start Date End Date Taking? Authorizing Provider  buPROPion (WELLBUTRIN XL) 300 MG 24 hr  tablet Take 300 mg by mouth daily.    [provider]  dexlansoprazole (DEXILANT) 60 MG capsule Take 1 capsule (60 mg total) by mouth daily. 11/27/16   Iva Boop, MD  levothyroxine (SYNTHROID, LEVOTHROID) 150 MCG tablet TAKE 1 TABLET BY MOUTH DAILY 03/27/17   Shelva Majestic, MD  metaxalone (SKELAXIN) 800 MG tablet Take 800 mg by mouth 3 (three) times daily. 03/30/17   [provider]  zolpidem (AMBIEN) 10 MG tablet Take 10 mg by mouth at bedtime as needed for sleep.    [provider]    Family History Family History  Problem Relation Age of Onset  . Hypertension Brother   . Hypertension Father   . Colon cancer Neg Hx   . Breast cancer Neg Hx     Social History Social History  Substance Use Topics  . Smoking status: Never Smoker  . Smokeless tobacco: Never Used  . Alcohol use 1.8 oz/week    3 Glasses of wine per week     Allergies   Sulfamethoxazole and Tetracycline hcl   Review of Systems Review of Systems  All other systems reviewed and are negative.    Physical Exam Updated Vital Signs BP 116/78   Pulse 91   Temp 98.1 F (36.7 C) (Oral)   Resp 18   LMP 11/28/2012   SpO2 100%   Physical Exam  Constitutional: She  is oriented to person, place, and time. She appears well-developed and well-nourished.  Non-toxic appearance. No distress.  HENT:  Head: Normocephalic and atraumatic.  Eyes: Pupils are equal, round, and reactive to light. Conjunctivae, EOM and lids are normal.  Neck: Normal range of motion. Neck supple. No tracheal deviation present. No thyroid mass present.  Cardiovascular: Normal rate, regular rhythm and normal heart sounds.  Exam reveals no gallop.   No murmur heard. Pulmonary/Chest: Effort normal and breath sounds normal. No stridor. No respiratory distress. She has no decreased breath sounds. She has no wheezes. She has no rhonchi. She has no rales.  Abdominal: Soft. Normal appearance and bowel sounds are normal.  She exhibits no distension. There is no tenderness. There is no rebound and no CVA tenderness.  Musculoskeletal: Normal range of motion. She exhibits no edema or tenderness.  Neurological: She is alert and oriented to person, place, and time. She has normal strength. No cranial nerve deficit or sensory deficit. GCS eye subscore is 4. GCS verbal subscore is 5. GCS motor subscore is 6.  Skin: Skin is warm and dry. No abrasion and no rash noted.  Psychiatric: She has a normal mood and affect. Her speech is normal and behavior is normal.  Nursing note and vitals reviewed.    ED Treatments / Results  Labs (all labs ordered are listed, but only abnormal results are displayed) Labs Reviewed  COMPREHENSIVE METABOLIC PANEL  CBC WITH DIFFERENTIAL/PLATELET  TROPONIN I    EKG  EKG Interpretation  Date/Time:  Monday April 30 2017 10:38:20 EDT Ventricular Rate:  94 PR Interval:    QRS Duration: 86 QT Interval:  469 QTC Calculation: 587 R Axis:   81 Text Interpretation:  Sinus rhythm Abnormal T, consider ischemia, diffuse leads Prolonged QT interval Confirmed by Lorre Nick ([id number]) on 04/30/2017 12:37:32 PM       Radiology No results found.  Procedures Procedures (including critical care time)  Medications Ordered in ED Medications  0.9 %  sodium chloride infusion (not administered)     Initial Impression / Assessment and Plan / ED Course  I have reviewed the triage vital signs and the nursing notes.  Pertinent labs & imaging results that were available during my care of the patient were reviewed by me and considered in my medical decision making (see chart for details).     Patient's initial EKG showed inferior lateral ischemia but the repeat improved. She had received aspirin prior to this. Has mild hypokalemia potassium 3.1. She has no pain in her chest at this time. Due to her history of left arm paresthesias with diaphoresis as well as EKG changes I will consult the  hospitalist for admission  Final Clinical Impressions(s) / ED Diagnoses   Final diagnoses:  SOB (shortness of breath)    New Prescriptions New Prescriptions   No medications on file     Lorre Nick, MD 04/30/17 1431

## 2017-05-01 ENCOUNTER — Observation Stay (HOSPITAL_BASED_OUTPATIENT_CLINIC_OR_DEPARTMENT_OTHER)
Admit: 2017-05-01 | Discharge: 2017-05-01 | Disposition: A | Discharge status: Home / Self Care | Payer: BLUE CROSS/BLUE SHIELD | Attending: Allergy & Immunology | Admitting: Allergy & Immunology

## 2017-05-01 ENCOUNTER — Observation Stay (HOSPITAL_BASED_OUTPATIENT_CLINIC_OR_DEPARTMENT_OTHER): Payer: BLUE CROSS/BLUE SHIELD

## 2017-05-01 ENCOUNTER — Ambulatory Visit (HOSPITAL_COMMUNITY): Payer: BLUE CROSS/BLUE SHIELD

## 2017-05-01 ENCOUNTER — Ambulatory Visit (HOSPITAL_COMMUNITY)
Admit: 2017-05-01 | Discharge: 2017-05-01 | Disposition: A | Discharge status: Home / Self Care | Payer: BLUE CROSS/BLUE SHIELD | Attending: Cardiovascular Disease | Admitting: Cardiovascular Disease

## 2017-05-01 DIAGNOSIS — R55 Syncope and collapse: Secondary | ICD-10-CM

## 2017-05-01 DIAGNOSIS — E785 Hyperlipidemia, unspecified: Secondary | ICD-10-CM | POA: Diagnosis present

## 2017-05-01 DIAGNOSIS — R202 Paresthesia of skin: Secondary | ICD-10-CM

## 2017-05-01 DIAGNOSIS — F411 Generalized anxiety disorder: Secondary | ICD-10-CM | POA: Diagnosis present

## 2017-05-01 DIAGNOSIS — R079 Chest pain, unspecified: Secondary | ICD-10-CM | POA: Diagnosis not present

## 2017-05-01 DIAGNOSIS — I341 Nonrheumatic mitral (valve) prolapse: Secondary | ICD-10-CM | POA: Diagnosis present

## 2017-05-01 DIAGNOSIS — E876 Hypokalemia: Secondary | ICD-10-CM | POA: Diagnosis present

## 2017-05-01 DIAGNOSIS — F410 Panic disorder [episodic paroxysmal anxiety] without agoraphobia: Secondary | ICD-10-CM | POA: Diagnosis not present

## 2017-05-01 DIAGNOSIS — R0602 Shortness of breath: Secondary | ICD-10-CM | POA: Diagnosis present

## 2017-05-01 DIAGNOSIS — E039 Hypothyroidism, unspecified: Secondary | ICD-10-CM | POA: Diagnosis not present

## 2017-05-01 DIAGNOSIS — R9431 Abnormal electrocardiogram [ECG] [EKG]: Secondary | ICD-10-CM

## 2017-05-01 DIAGNOSIS — K219 Gastro-esophageal reflux disease without esophagitis: Secondary | ICD-10-CM | POA: Diagnosis not present

## 2017-05-01 DIAGNOSIS — G5603 Carpal tunnel syndrome, bilateral upper limbs: Secondary | ICD-10-CM | POA: Diagnosis present

## 2017-05-01 HISTORY — DX: Syncope and collapse: R55

## 2017-05-01 LAB — BASIC METABOLIC PANEL
Anion gap: 7 (ref 5–15)
BUN: 13 mg/dL (ref 6–20)
CO2: 26 mmol/L (ref 22–32)
Calcium: 8.9 mg/dL (ref 8.9–10.3)
Chloride: 107 mmol/L (ref 101–111)
Creatinine, Ser: 0.68 mg/dL (ref 0.44–1.00)
GFR calc Af Amer: >60 mL/min (ref >60)
GFR calc non Af Amer: >60 mL/min (ref >60)
Glucose, Bld: 93 mg/dL (ref 65–99)
Potassium: 3.8 mmol/L (ref 3.5–5.1)
Sodium: 140 mmol/L (ref 135–145)

## 2017-05-01 LAB — LIPID PANEL
Cholesterol: 222 mg/dL — ABNORMAL HIGH (ref 0–200)
HDL: 109 mg/dL (ref >40)
LDL Cholesterol: 103 mg/dL — ABNORMAL HIGH (ref 0–99)
Total CHOL/HDL Ratio: 2 RATIO
Triglycerides: 51 mg/dL (ref <150)
VLDL: 10 mg/dL (ref 0–40)

## 2017-05-01 LAB — HEMOGLOBIN A1C
Hgb A1c MFr Bld: 5.5 % (ref 4.8–5.6)
Mean Plasma Glucose: 111.15 mg/dL

## 2017-05-01 LAB — HIV ANTIBODY (ROUTINE TESTING W REFLEX): HIV Screen 4th Generation wRfx: NONREACTIVE

## 2017-05-01 LAB — ECHOCARDIOGRAM COMPLETE
Height: 66 in
Weight: 1984 oz

## 2017-05-01 LAB — TROPONIN I: Troponin I: <0.03 ng/mL (ref <0.03)

## 2017-05-01 MED ORDER — DEXLANSOPRAZOLE 60 MG PO CPDR
60.0000 mg | DELAYED_RELEASE_CAPSULE | Freq: Every day | ORAL | Status: DC
  Administered 2017-05-02: 60 mg via ORAL
  Filled 2017-05-01 (×2): qty 1

## 2017-05-01 MED ORDER — IOPAMIDOL (ISOVUE-370) INJECTION 76%
80.0000 mL | Freq: Once | INTRAVENOUS | Status: AC | PRN
  Administered 2017-05-01: 80 mL via INTRAVENOUS

## 2017-05-01 MED ORDER — ACETAMINOPHEN 325 MG PO TABS
650.0000 mg | ORAL_TABLET | Freq: Four times a day (QID) | ORAL | Status: DC | PRN
  Administered 2017-05-01 – 2017-05-02 (×3): 650 mg via ORAL
  Filled 2017-05-01 (×3): qty 2

## 2017-05-01 MED ORDER — NON FORMULARY: 60.0000 mg | Freq: Every day | Status: DC

## 2017-05-01 MED ORDER — DEXLANSOPRAZOLE 60 MG PO CPDR
60.0000 mg | DELAYED_RELEASE_CAPSULE | Freq: Every day | ORAL | Status: DC
  Filled 2017-05-01: qty 1

## 2017-05-01 MED ORDER — ALUM & MAG HYDROXIDE-SIMETH 200-200-20 MG/5ML PO SUSP
30.0000 mL | ORAL | Status: DC | PRN
  Administered 2017-05-01: 30 mL via ORAL
  Filled 2017-05-01: qty 30

## 2017-05-01 NOTE — Plan of Care (Signed)
Problem: Safety: Goal: Ability to remain free from injury will improve Outcome: Progressing Pt using call bell when needed

## 2017-05-01 NOTE — Progress Notes (Addendum)
Call received from CT at [facility] stating that call will need to placed to cardiologist to verify test time. CT technician requested that care link transport be a suspended until further notice.  Care link contacted.   Second call received from CT technician at [facility]. Tech reported that Dr. Shirlee Latch would be available to over see test. Care link transport rearranged.

## 2017-05-01 NOTE — Care Management Note (Signed)
Case Management Note  Patient Details  Name: [name] Case MRN: [id number] Date of Birth: 1961-06-07  Subjective/Objective:  56 y/o f admitted w/L arm numbness.                  Action/Plan:d/c plan home.   Expected Discharge Date:   (unknown)               Expected Discharge Plan:  Home/Self Care  In-House Referral:     Discharge planning Services  CM Consult  Post Acute Care Choice:    Choice offered to:     DME Arranged:    DME Agency:     HH Arranged:    HH Agency:     Status of Service:  In process, will continue to follow  If discussed at Long Length of Stay Meetings, dates discussed:    Additional Comments:  Lanier Clam, RN 05/01/2017, 12:10 PM

## 2017-05-01 NOTE — Procedures (Signed)
ELECTROENCEPHALOGRAM REPORT  Date of Study: 05/01/2017  Patient's Name: [name] Case MRN: [id number] Date of Birth: 02-03-1961  Referring Provider: Dr. Eston Esters  Clinical History: This is a 56 year old woman with loss of consciousness with brief period of confusion after.  Medications: acetaminophen (TYLENOL) tablet 650 mg  aspirin tablet 325 mg  buPROPion (WELLBUTRIN XL) 24 hr tablet 300 mg  enoxaparin (LOVENOX) injection 40 mg  levothyroxine (SYNTHROID, LEVOTHROID) tablet 150 mcg  LORazepam (ATIVAN) injection 1 mg  pantoprazole (PROTONIX) EC tablet 40 mg   Technical Summary: A multichannel digital EEG recording measured by the international 10-20 system with electrodes applied with paste and impedances below 5000 ohms performed in our laboratory with EKG monitoring in an awake and drowsy patient.  Hyperventilation was not performed. Photic stimulation was performed.  The digital EEG was referentially recorded, reformatted, and digitally filtered in a variety of bipolar and referential montages for optimal display.    Description: The patient is awake and drowsy during the recording.  During maximal wakefulness, there is a symmetric, medium voltage 10 Hz posterior dominant rhythm that attenuates with eye opening.  The record is symmetric.  During drowsiness, there is an increase in theta slowing of the background.  Deeper stages of sleep were not seen.  Photic stimulation did not elicit any abnormalities.  There were no epileptiform discharges or electrographic seizures seen.    EKG lead was unremarkable.  Impression: This awake and drowsy EEG is normal.    Clinical Correlation: A normal EEG does not exclude a clinical diagnosis of epilepsy. Clinical correlation is advised.   Patrcia Dolly, M.D.

## 2017-05-01 NOTE — Consult Note (Addendum)
Cardiology Consultation:   Patient ID: [name] Case; [id number]; 1961/06/14   Admit date: 04/30/2017 Date of Consult: 05/01/2017  Primary Care Provider: Shelva Majestic, MD Primary Cardiologist: New , Nahser  Primary Electrophysiologist:  None    Patient Profile:   [name] Case is a 56 y.o. female with a hx of anxiety  who is being seen today for the evaluation of an episode of syncope  at the request of  Dr. Selena Batten .  History of Present Illness:   [name] Case is a 56 year old female has who has a history of anxiety. [name] Case was admitted to the hospital   after having a sudden episode of syncope yesterday AM while sitting at [name] Case desk. The episode was not associated with any nausea, vomiting, or gagging. [name] Case has not had anything to eat or drink. There was no diarrhea or abdominal pain.Marland Kitchen [name] Case was unconscious for several seconds. This was followed by brief period of confusion. There was no seizure activity. There was no incontinence of bowel or bladder. [name] Case feels quite well. [name] Case's never had any previous episodes of syncope or presyncope. [name] Case exercises on a regular basis. [name] Case's never had any episodes of chest pain or shortness of breath.  [name] Case denies any blood in [name] Case stool. [name] Case denies any fevers or chills. [name] Case denies any history of weight and weight loss. [name] Case denies heat or cold intolerance.  Past Medical History:  Diagnosis Date  . Anemia   . Anxiety   . Disorder of sebaceous glands    sebaceous neoplasm  . GERD 07/18/2007  . Heart murmur   . HYPOTHYROIDISM 07/18/2007  . MITRAL VALVE PROLAPSE 07/18/2007  . 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 Pill esophagitis 2007    Past Surgical History:  Procedure Laterality Date  . APPENDECTOMY    . COLONOSCOPY  multiple   Normal  . UPPER GASTROINTESTINAL ENDOSCOPY  02/08/2009   inflammatory nodule in antrum, 50 Fr Maloney dilation  . UPPER GASTROINTESTINAL ENDOSCOPY  2007x2   pill esophagitis/ulcer  . UPPER GASTROINTESTINAL  ENDOSCOPY  2004   erosive esophagitis and mild esophageal stricture - dilated     Home Medications:  Prior to Admission medications   Medication Sig Start Date End Date Taking? Authorizing Provider  buPROPion (WELLBUTRIN XL) 300 MG 24 hr tablet Take 300 mg by mouth daily.   Yes [provider]  dexlansoprazole (DEXILANT) 60 MG capsule Take 1 capsule (60 mg total) by mouth daily. 11/27/16  Yes Iva Boop, MD  levothyroxine (SYNTHROID, LEVOTHROID) 150 MCG tablet TAKE 1 TABLET BY MOUTH DAILY 03/27/17  Yes Shelva Majestic, MD  naproxen sodium (ANAPROX) 220 MG tablet Take 440 mg by mouth daily as needed (back pain).   Yes [provider]    Inpatient Medications: Scheduled Meds: . aspirin  325 mg Oral Daily  . buPROPion  300 mg Oral Daily  . enoxaparin (LOVENOX) injection  40 mg Subcutaneous Q24H  . levothyroxine  150 mcg Oral QAC breakfast  . pantoprazole  40 mg Oral Daily   Continuous Infusions: . sodium chloride 20 mL/hr at 04/30/17 1303   PRN Meds: acetaminophen, LORazepam  Allergies:    Allergies  Allergen Reactions  . Sulfamethoxazole     REACTION: hives  . Tetracycline Hcl     REACTION: ulcers in throat    Social History:   Social History   Social History  . Marital status: Married    Spouse name: N/A  . Number of children: 2  .  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 Smoking status: Never Smoker  . Smokeless tobacco: Never Used  . Alcohol use 1.8 oz/week    3 Glasses of wine per week  . Drug use: No  . Sexual activity: Not on file   Other Topics Concern  . Not on file   Social History Narrative   Married. 2 children. No grandkids. English Bulldog and boston terrier. grandpet-rescue dog.       Research officer, political party for orthodontist      Hobbies: exercise, travel ([place] favorite spot)    Family History:    Family History  Problem Relation Age of  Onset  . Hypertension Brother   . Hypertension Father   . Colon cancer Neg Hx   . Breast cancer Neg Hx      ROS:  Please see the history of present illness.  ROS  All other ROS reviewed and negative.     Physical Exam/Data:   Vitals:   04/30/17 1500 04/30/17 1552 04/30/17 1714 05/01/17 0506  BP: 136/81 (!) 152/81 127/82 108/62  Pulse: 76 93 75 61  Resp: Temp:  98.3 F (36.8 C) 98.5 F (36.9 C) 98.5 F (36.9 C)  TempSrc:  Oral  Oral  SpO2: 100% 98% 97% 98%  Weight:   124 lb 5.4 oz (56.4 kg)   Height:    (1.676 m)     Intake/Output Summary (Last 24 hours) at 05/01/17 0848 Last data filed at 05/01/17 0800  Gross per 24 hour  Intake              579 ml  Output                1 ml  Net              578 ml   Filed Weights   04/30/17 1714  Weight: 124 lb 5.4 oz (56.4 kg)   Body mass index is 20.07 kg/m.  General:  Well nourished, well developed, in no acute distress HEENT: normal Lymph: no adenopathy Neck: no JVD Endocrine:  No thryomegaly Vascular: No carotid bruits; FA pulses 2+ bilaterally without bruits  Cardiac:  normal S1, S2; RR; no murmur  Lungs:  clear to auscultation bilaterally, no wheezing, rhonchi or rales  Abd: soft, nontender, no hepatomegaly  Ext: no edema Musculoskeletal:  No deformities, BUE and BLE strength normal and equal Skin: warm and dry  Neuro:  CNs 2-12 intact, no focal abnormalities noted Psych:  Normal affect   EKG:  The EKG was personally reviewed and demonstrates:  NSR at 94 , normal PR ,  Diffuse TWI .  Telemetry:  Telemetry was personally reviewed and demonstrates:   NSR   Relevant CV Studies:   Laboratory Data:  Chemistry Recent Labs Lab 04/30/17 1255 05/01/17 0528  NA 141 140  K 3.1* 3.8  CL 106 107  CO2 25 26  GLUCOSE 84 93  BUN 17 13  CREATININE 0.80 0.68  CALCIUM 9.3 8.9  GFRNONAA >60 >60  GFRAA >60 >60  ANIONGAP 10 7     Recent Labs Lab 04/30/17 1255  PROT 7.1  ALBUMIN 4.2  AST 32    ALT 28  ALKPHOS 95  BILITOT 0.7   Hematology Recent Labs Lab 04/30/17 1255  WBC 8.3  RBC 4.00  HGB 11.2*  HCT 34.1*  MCV 85.3  MCH 28.0  MCHC 32.8  RDW 13.5  PLT 266   Cardiac Enzymes Recent Labs Lab 04/30/17 1255 04/30/17 1729 04/30/17 2322  TROPONINI <0.03 <0.03 <0.03   No results for input(s): TROPIPOC in the last 168 hours.  BNPNo results for input(s): BNP, PROBNP in the last 168 hours.  DDimer No results for input(s): DDIMER in the last 168 hours.  Radiology/Studies:  Dg Chest 2 View  Result Date: 04/30/2017 CLINICAL DATA:  Syncope EXAM: CHEST  2 VIEW COMPARISON:  CT chest dated 10/20/2015 FINDINGS: Lungs are clear.  No pleural effusion or pneumothorax. The heart is normal in size. Visualized osseous structures are within normal limits. IMPRESSION: Normal chest radiographs. Electronically Signed   By: Charline Bills M.D.   On: 04/30/2017 13:45   Mr Brain Wo Contrast  Result Date: 04/30/2017 CLINICAL DATA:  Acute onset left upper extremity numbness and tingling. EXAM: MRI HEAD WITHOUT CONTRAST MRI CERVICAL SPINE WITHOUT CONTRAST TECHNIQUE: Multiplanar, multiecho pulse sequences of the brain and surrounding structures, and cervical spine, to include the craniocervical junction and cervicothoracic junction, were obtained without intravenous contrast. COMPARISON:  None available. FINDINGS: MRI HEAD FINDINGS Brain: Study moderately degraded by motion. Cerebral volume within normal limits for age. No significant cerebral white matter changes. No abnormal foci of restricted diffusion to suggest acute or subacute ischemia. No encephalomalacia to suggest chronic infarction. No susceptibility artifact to suggest acute or chronic intracranial hemorrhage. No mass lesion, midline shift or mass effect. No hydrocephalus. No extra-axial fluid collection. Pituitary gland and suprasellar region within normal limits. Midline structures intact and normal. Major dural sinuses grossly patent.  Vascular: Major intracranial vascular flow voids grossly maintained. Skull and upper cervical spine: Craniocervical junction normal. Upper cervical spine within normal limits. Bone marrow signal intensity normal. No scalp soft tissue abnormality. Sinuses/Orbits: Globes and orbital soft tissues within normal limits. Paranasal sinuses clear. No mastoid effusion. Inner ear structures grossly normal. Other: None. MRI CERVICAL SPINE FINDINGS Alignment: Study mildly degraded by motion artifact. Vertebral bodies normally aligned with preservation of the normal cervical lordosis. No listhesis. Vertebrae: Vertebral body heights well maintained. No evidence for acute or chronic fracture. Bone marrow signal intensity within normal limits. No discrete or worrisome osseous lesions. No abnormal marrow edema. Cord: Signal intensity within the cervical spinal cord is normal. Posterior Fossa, vertebral arteries, paraspinal tissues: Posterior and paraspinous soft tissues within normal limits. Normal intravascular flow voids present within the vertebral arteries bilaterally. Disc levels: C2-C3: Unremarkable. C3-C4:  Unremarkable. C4-C5: Small 3 mm central disc protrusion indents the ventral thecal sac. No significant canal or neural foraminal stenosis. C5-C6:  Unremarkable. C6-C7:  Unremarkable. C7-T1:  Unremarkable. Visualized upper thoracic spine is unremarkable. IMPRESSION: MRI HEAD IMPRESSION: Normal brain MRI.  No acute intracranial abnormality identified. MRI CERVICAL SPINE IMPRESSION: 1. Small central disc protrusion at C4-5 without stenosis. 2. Otherwise unremarkable MRI of the cervical spine. No findings to explain patient's symptoms identified. Electronically Signed   By: Rise Mu M.D.   On: 04/30/2017 20:48   Mr Cervical Spine Wo Contrast  Result Date: 04/30/2017 CLINICAL DATA:  Acute onset left upper extremity numbness and tingling. EXAM: MRI HEAD WITHOUT CONTRAST MRI CERVICAL SPINE WITHOUT CONTRAST  TECHNIQUE: Multiplanar, multiecho pulse sequences of the brain and surrounding structures, and cervical spine, to include the craniocervical junction and cervicothoracic junction, were obtained without intravenous contrast. COMPARISON:  None available. FINDINGS: MRI HEAD FINDINGS Brain: Study moderately degraded by motion. Cerebral volume within normal limits for age. No significant cerebral  white matter changes. No abnormal foci of restricted diffusion to suggest acute or subacute ischemia. No encephalomalacia to suggest chronic infarction. No susceptibility artifact to suggest acute or chronic intracranial hemorrhage. No mass lesion, midline shift or mass effect. No hydrocephalus. No extra-axial fluid collection. Pituitary gland and suprasellar region within normal limits. Midline structures intact and normal. Major dural sinuses grossly patent. Vascular: Major intracranial vascular flow voids grossly maintained. Skull and upper cervical spine: Craniocervical junction normal. Upper cervical spine within normal limits. Bone marrow signal intensity normal. No scalp soft tissue abnormality. Sinuses/Orbits: Globes and orbital soft tissues within normal limits. Paranasal sinuses clear. No mastoid effusion. Inner ear structures grossly normal. Other: None. MRI CERVICAL SPINE FINDINGS Alignment: Study mildly degraded by motion artifact. Vertebral bodies normally aligned with preservation of the normal cervical lordosis. No listhesis. Vertebrae: Vertebral body heights well maintained. No evidence for acute or chronic fracture. Bone marrow signal intensity within normal limits. No discrete or worrisome osseous lesions. No abnormal marrow edema. Cord: Signal intensity within the cervical spinal cord is normal. Posterior Fossa, vertebral arteries, paraspinal tissues: Posterior and paraspinous soft tissues within normal limits. Normal intravascular flow voids present within the vertebral arteries bilaterally. Disc levels:  C2-C3: Unremarkable. C3-C4:  Unremarkable. C4-C5: Small 3 mm central disc protrusion indents the ventral thecal sac. No significant canal or neural foraminal stenosis. C5-C6:  Unremarkable. C6-C7:  Unremarkable. C7-T1:  Unremarkable. Visualized upper thoracic spine is unremarkable. IMPRESSION: MRI HEAD IMPRESSION: Normal brain MRI.  No acute intracranial abnormality identified. MRI CERVICAL SPINE IMPRESSION: 1. Small central disc protrusion at C4-5 without stenosis. 2. Otherwise unremarkable MRI of the cervical spine. No findings to explain patient's symptoms identified. Electronically Signed   By: Rise Mu M.D.   On: 04/30/2017 20:48    Assessment and Plan:   1. Syncope:  [name] Case presents following an episode of syncope. The episode lasted for several seconds. [name] Case was unconscious but came to very quickly. There was no bowel or bladder incontinence. He was no warning. The episode was not associated with any nausea, vomiting, diarrhea, or gagging. He's never had any similar episodes of chest discomfort.  [name] Case EKG shows a normal PR. [name] Case does have diffuse T-wave inversion. I do not have an old EKG to compare. I've ordered a repeat EKG today.  [name] Case troponin levels were negative. Glucose levels have been normal.  An echocardiogram has been ordered. We'll continue to monitor.   For questions or updates, please contact [facility] Please consult [email or web address] for contact info under Cardiology/STEMI.   Signed, Kristeen Miss, MD  05/01/2017 8:48 AM  Addendum  ECG today shows no evidence of TWI. ? Concerning for transient ischemia .  troponins are negative. Will consider coronary CT angio tomorrow.  No angina .    Kristeen Miss, MD  05/01/2017 9:48 AM    [facility] [address] [place],  [address] [place], [place]  [address] Pager [phone] Phone: [phone]; Fax: [phone]

## 2017-05-01 NOTE — Progress Notes (Signed)
  Echocardiogram 2D Echocardiogram has been performed.  Kimm Ungaro L Androw 05/01/2017, 1:40 PM

## 2017-05-01 NOTE — Consult Note (Signed)
NEURO HOSPITALIST CONSULT NOTE   Requestig physician: Dr. Selena Batten   Reason for Consult: bilateral finger tingling   History obtained from:  Patient     HPI:                                                                                                                                          [name] Case is an 56 y.o. female with hx of anxiety who was in her usual state of health today sitting at her desk trying to send an email and suddenly had a syncopal episode of a few seconds followed by a brief period of confusion. A coworker told family that the patient had left arm tucked close to her chest in a weird position but was not shaking. No urine/stool incontinence.  Reports feeling light headed with some diaphoresis prior to this happening. Possibly some chest discomfort at the time as well. Currently back to baseline but still having bilateral tips of fingers tingling. Yesterday she had numbness in her left arm after MRI but this has resolved. MRI brain and cervical spine showed no etiology for her symptoms.   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 Reports tingling in fingertips bilaterally that gets better with shrugging the wrists.   Past Medical History:  Diagnosis Date  . Anemia   . Anxiety   . Disorder of sebaceous glands    sebaceous neoplasm  . GERD 07/18/2007  . Heart murmur   . HYPOTHYROIDISM 07/18/2007  . MITRAL VALVE PROLAPSE 07/18/2007  . 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 Pill esophagitis 2007    Past Surgical History:  Procedure Laterality Date  . APPENDECTOMY    . COLONOSCOPY  multiple   Normal  . UPPER GASTROINTESTINAL ENDOSCOPY  02/08/2009   inflammatory nodule in antrum, 50 Fr Maloney dilation  . UPPER GASTROINTESTINAL ENDOSCOPY  2007x2   pill esophagitis/ulcer  . UPPER GASTROINTESTINAL ENDOSCOPY  2004   erosive esophagitis and mild esophageal  stricture - dilated    Family History  Problem Relation Age of Onset  . Hypertension Brother   . Hypertension Father   . Colon cancer Neg Hx   . Breast cancer Neg Hx      Social History:  reports that she has never smoked. She has never used smokeless tobacco. She reports that she drinks about 1.8 oz of alcohol per week . She reports that she does not use drugs.  Allergies  Allergen Reactions  . Sulfamethoxazole     REACTION: hives  . Tetracycline Hcl     REACTION: ulcers in throat    MEDICATIONS:  Prior to Admission:  Prescriptions Prior to Admission  Medication Sig Dispense Refill Last Dose  . buPROPion (WELLBUTRIN XL) 300 MG 24 hr tablet Take 300 mg by mouth daily.   04/30/2017 at Unknown time  . dexlansoprazole (DEXILANT) 60 MG capsule Take 1 capsule (60 mg total) by mouth daily. 90 capsule 1 04/30/2017 at Unknown time  . levothyroxine (SYNTHROID, LEVOTHROID) 150 MCG tablet TAKE 1 TABLET BY MOUTH DAILY 90 tablet 1 04/30/2017 at Unknown time  . naproxen sodium (ANAPROX) 220 MG tablet Take 440 mg by mouth daily as needed (back pain).   Past Week at Unknown time   Scheduled: . aspirin  325 mg Oral Daily  . buPROPion  300 mg Oral Daily  . enoxaparin (LOVENOX) injection  40 mg Subcutaneous Q24H  . levothyroxine  150 mcg Oral QAC breakfast  . pantoprazole  40 mg Oral Daily     ROS:                                                                                                                                       History obtained from the patient  General ROS: negative for - chills, fatigue, fever, night sweats, weight gain or weight loss Psychological ROS: negative for - behavioral disorder, hallucinations, memory difficulties, mood swings or suicidal ideation Ophthalmic ROS: negative for - blurry vision, double vision, eye pain or loss of vision ENT ROS:  negative for - epistaxis, nasal discharge, oral lesions, sore throat, tinnitus or vertigo Allergy and Immunology ROS: negative for - hives or itchy/watery eyes Hematological and Lymphatic ROS: negative for - bleeding problems, bruising or swollen lymph nodes Endocrine ROS: negative for - galactorrhea, hair pattern changes, polydipsia/polyuria or temperature intolerance Respiratory ROS: negative for - cough, hemoptysis, shortness of breath or wheezing Cardiovascular ROS: negative for - chest pain, dyspnea on exertion, edema or irregular heartbeat Gastrointestinal ROS: negative for - abdominal pain, diarrhea, hematemesis, nausea/vomiting or stool incontinence Genito-Urinary ROS: negative for - dysuria, hematuria, incontinence or urinary frequency/urgency Musculoskeletal ROS: negative for - joint swelling or muscular weakness Neurological ROS: as noted in HPI Dermatological ROS: negative for rash and skin lesion changes   Blood pressure 108/62, pulse 61, temperature 98.5 F (36.9 C), temperature source Oral, resp. rate 18, height  (1.676 m), weight 56.4 kg (124 lb 5.4 oz), last menstrual period 11/28/2012, SpO2 98 %.   Neurologic Examination:  HEENT-  Normocephalic, no lesions, without obvious abnormality.  Normal external eye and conjunctiva.  Normal TM's bilaterally.  Normal auditory canals and external ears. Normal external nose, mucus membranes and septum.  Normal pharynx. Cardiovascular- S1, S2 normal, pulses palpable throughout   Lungs- chest clear, no wheezing, rales, normal symmetric air entry Abdomen- normal findings: bowel sounds normal Extremities- no edema Lymph-no adenopathy palpable Musculoskeletal-no joint tenderness, deformity or swelling Skin-warm and dry, no hyperpigmentation, vitiligo, or suspicious lesions  Neurological Examination Mental Status: Alert, oriented,  thought content appropriate.  Speech fluent without evidence of aphasia.  Able to follow 3 step commands without difficulty. Cranial Nerves: II: Discs flat bilaterally; Visual fields grossly normal,  III,IV, VI: ptosis not present, extra-ocular motions intact bilaterally pupils equal, round, reactive to light and accommodation V,VII: smile symmetric, facial light touch sensation normal bilaterally VIII: hearing normal bilaterally IX,X: uvula rises symmetrically XI: bilateral shoulder shrug XII: midline tongue extension Motor: Right : Upper extremity   5/5    Left:     Upper extremity   5/5  Lower extremity   5/5     Lower extremity   5/5 Tone and bulk:normal tone throughout; no atrophy noted Sensory: Pinprick and light touch intact throughout, bilaterally --positive tinnels and reproducible sensation with pressure over the flexor retinaculum.  Deep Tendon Reflexes: 2+ and symmetric throughout Plantars: Right: downgoing   Left: downgoing Cerebellar: normal finger-to-nose,  and normal heel-to-shin test Gait: normal gait and station   Lab Results: Basic Metabolic Panel:  Recent Labs Lab 04/30/17 1255 05/01/17 0528  NA 141 140  K 3.1* 3.8  CL 106 107  CO2 25 26  GLUCOSE 84 93  BUN 17 13  CREATININE 0.80 0.68  CALCIUM 9.3 8.9    Liver Function Tests:  Recent Labs Lab 04/30/17 1255  AST 32  ALT 28  ALKPHOS 95  BILITOT 0.7  PROT 7.1  ALBUMIN 4.2   No results for input(s): LIPASE, AMYLASE in the last 168 hours. No results for input(s): AMMONIA in the last 168 hours.  CBC:  Recent Labs Lab 04/30/17 1255  WBC 8.3  NEUTROABS 5.8  HGB 11.2*  HCT 34.1*  MCV 85.3  PLT 266    Cardiac Enzymes:  Recent Labs Lab 04/30/17 1255 04/30/17 1729 04/30/17 2322  TROPONINI <0.03 <0.03 <0.03    Lipid Panel:  Recent Labs Lab 05/01/17 0528  CHOL 222*  TRIG 51  HDL 109  CHOLHDL 2.0  VLDL 10  LDLCALC 161*    CBG: No results for input(s): GLUCAP in the last  168 hours.  Microbiology: Results for orders placed or performed in visit on 03/20/12  Urine culture     Status: None   Collection Time: 03/20/12  9:44 AM  Result Value Ref Range Status   Colony Count 1,000 COLONIES/ML  Final   Organism ID, Bacteria Insignificant Growth  Final    Coagulation Studies: No results for input(s): LABPROT, INR in the last 72 hours.  Imaging: Dg Chest 2 View  Result Date: 04/30/2017 CLINICAL DATA:  Syncope EXAM: CHEST  2 VIEW COMPARISON:  CT chest dated 10/20/2015 FINDINGS: Lungs are clear.  No pleural effusion or pneumothorax. The heart is normal in size. Visualized osseous structures are within normal limits. IMPRESSION: Normal chest radiographs. Electronically Signed   By: Charline Bills M.D.   On: 04/30/2017 13:45   Mr Brain Wo Contrast  Result Date: 04/30/2017 CLINICAL DATA:  Acute onset left upper extremity numbness and tingling. EXAM: MRI HEAD  WITHOUT CONTRAST MRI CERVICAL SPINE WITHOUT CONTRAST TECHNIQUE: Multiplanar, multiecho pulse sequences of the brain and surrounding structures, and cervical spine, to include the craniocervical junction and cervicothoracic junction, were obtained without intravenous contrast. COMPARISON:  None available. FINDINGS: MRI HEAD FINDINGS Brain: Study moderately degraded by motion. Cerebral volume within normal limits for age. No significant cerebral white matter changes. No abnormal foci of restricted diffusion to suggest acute or subacute ischemia. No encephalomalacia to suggest chronic infarction. No susceptibility artifact to suggest acute or chronic intracranial hemorrhage. No mass lesion, midline shift or mass effect. No hydrocephalus. No extra-axial fluid collection. Pituitary gland and suprasellar region within normal limits. Midline structures intact and normal. Major dural sinuses grossly patent. Vascular: Major intracranial vascular flow voids grossly maintained. Skull and upper cervical spine: Craniocervical  junction normal. Upper cervical spine within normal limits. Bone marrow signal intensity normal. No scalp soft tissue abnormality. Sinuses/Orbits: Globes and orbital soft tissues within normal limits. Paranasal sinuses clear. No mastoid effusion. Inner ear structures grossly normal. Other: None. MRI CERVICAL SPINE FINDINGS Alignment: Study mildly degraded by motion artifact. Vertebral bodies normally aligned with preservation of the normal cervical lordosis. No listhesis. Vertebrae: Vertebral body heights well maintained. No evidence for acute or chronic fracture. Bone marrow signal intensity within normal limits. No discrete or worrisome osseous lesions. No abnormal marrow edema. Cord: Signal intensity within the cervical spinal cord is normal. Posterior Fossa, vertebral arteries, paraspinal tissues: Posterior and paraspinous soft tissues within normal limits. Normal intravascular flow voids present within the vertebral arteries bilaterally. Disc levels: C2-C3: Unremarkable. C3-C4:  Unremarkable. C4-C5: Small 3 mm central disc protrusion indents the ventral thecal sac. No significant canal or neural foraminal stenosis. C5-C6:  Unremarkable. C6-C7:  Unremarkable. C7-T1:  Unremarkable. Visualized upper thoracic spine is unremarkable. IMPRESSION: MRI HEAD IMPRESSION: Normal brain MRI.  No acute intracranial abnormality identified. MRI CERVICAL SPINE IMPRESSION: 1. Small central disc protrusion at C4-5 without stenosis. 2. Otherwise unremarkable MRI of the cervical spine. No findings to explain patient's symptoms identified. Electronically Signed   By: Rise Mu M.D.   On: 04/30/2017 20:48   Mr Cervical Spine Wo Contrast  Result Date: 04/30/2017 CLINICAL DATA:  Acute onset left upper extremity numbness and tingling. EXAM: MRI HEAD WITHOUT CONTRAST MRI CERVICAL SPINE WITHOUT CONTRAST TECHNIQUE: Multiplanar, multiecho pulse sequences of the brain and surrounding structures, and cervical spine, to include  the craniocervical junction and cervicothoracic junction, were obtained without intravenous contrast. COMPARISON:  None available. FINDINGS: MRI HEAD FINDINGS Brain: Study moderately degraded by motion. Cerebral volume within normal limits for age. No significant cerebral white matter changes. No abnormal foci of restricted diffusion to suggest acute or subacute ischemia. No encephalomalacia to suggest chronic infarction. No susceptibility artifact to suggest acute or chronic intracranial hemorrhage. No mass lesion, midline shift or mass effect. No hydrocephalus. No extra-axial fluid collection. Pituitary gland and suprasellar region within normal limits. Midline structures intact and normal. Major dural sinuses grossly patent. Vascular: Major intracranial vascular flow voids grossly maintained. Skull and upper cervical spine: Craniocervical junction normal. Upper cervical spine within normal limits. Bone marrow signal intensity normal. No scalp soft tissue abnormality. Sinuses/Orbits: Globes and orbital soft tissues within normal limits. Paranasal sinuses clear. No mastoid effusion. Inner ear structures grossly normal. Other: None. MRI CERVICAL SPINE FINDINGS Alignment: Study mildly degraded by motion artifact. Vertebral bodies normally aligned with preservation of the normal cervical lordosis. No listhesis. Vertebrae: Vertebral body heights well maintained. No evidence for acute or chronic fracture. Bone marrow  signal intensity within normal limits. No discrete or worrisome osseous lesions. No abnormal marrow edema. Cord: Signal intensity within the cervical spinal cord is normal. Posterior Fossa, vertebral arteries, paraspinal tissues: Posterior and paraspinous soft tissues within normal limits. Normal intravascular flow voids present within the vertebral arteries bilaterally. Disc levels: C2-C3: Unremarkable. C3-C4:  Unremarkable. C4-C5: Small 3 mm central disc protrusion indents the ventral thecal sac. No  significant canal or neural foraminal stenosis. C5-C6:  Unremarkable. C6-C7:  Unremarkable. C7-T1:  Unremarkable. Visualized upper thoracic spine is unremarkable. IMPRESSION: MRI HEAD IMPRESSION: Normal brain MRI.  No acute intracranial abnormality identified. MRI CERVICAL SPINE IMPRESSION: 1. Small central disc protrusion at C4-5 without stenosis. 2. Otherwise unremarkable MRI of the cervical spine. No findings to explain patient's symptoms identified. Electronically Signed   By: Rise Mu M.D.   On: 04/30/2017 20:48       Assessment and plan per attending neurologist  Felicie Morn PA-C [facility] [phone]  05/01/2017, 8:57 AM  Attending addendum Patient seen and examined this afternoon. Reports being back to baseline I have reviewed the note above and agree with the history and physical documeted above. I have made the necessary edits in the note above. MRI reviewed, no abnormality. EEG also reported normal.  Assessment/Plan: 56 YO female with bilateral finger tingling that occured after syncopal event.  Also reports of tingling in both fingertips at baseline. 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 Exam shows no weakness, no dermatomal abnormality, positive tinels and Phalen sign.  Episode of LOC - I do not have a good explanation from a neurological standpoint.   Impression Syncope - likely cardiogenic.  Less likely seizure but still an episode of LOC followed by confusion - seizure precautions as below Bilateral median neuropathy  Recs: Cardiac w/u for syncope as you are. Will need out patient neurology appointment for nerve conduction studies.  Cock up splint for both wrists at night for symptomatic treatment.  -- Per [facility] statutes, patients with seizures are not allowed to drive until they have been seizure-free for six months.  Use caution when using heavy  equipment or power tools. Avoid working on ladders or at heights. Take showers instead of baths. Ensure the water temperature is not too high on the home water heater. Do not go swimming alone. Do not lock yourself in a room alone (i.e. bathroom). When caring for infants or small children, sit down when holding, feeding, or changing them to minimize risk of injury to the child in the event you have a seizure. Maintain good sleep hygiene. Avoid alcohol.   If patient has another seizure, call 911 and bring them back to the ED if: A. The seizure lasts longer than 5 minutes.  B. The patient doesn't wake shortly after the seizure or has new problems such as difficulty seeing, speaking or moving following the seizure C. The patient was injured during the seizure D. The patient has a temperature over 102 F (39C) E. The patient vomited during the seizure and now is having trouble breathing --  Please call neurology with questions  Milon Dikes, MD [facility] [phone]  If 7pm to 7am, please call on call as listed on AMION.

## 2017-05-01 NOTE — Progress Notes (Signed)
Patient ID: [name] Case, female   DOB: 1961-02-02, 56 y.o.   MRN: [id number]                                                                PROGRESS NOTE                                                                                                                                                                                                             Patient Demographics:    [name] Case, is a 56 y.o. female, DOB - [birth year], [id number]:[id number]  Admit date - 04/30/2017   Admitting Physician Eston Esters, MD  Outpatient Primary MD for the patient is Shelva Majestic, MD  LOS - 0  Outpatient Specialists:      Chief Complaint  Patient presents with  . Panic Attack       Brief Narrative    56 yo female with hx of anxiety who was in her usual state of health today sitting at her desk trying to send an email and suddenly had a syncopal episode of a few seconds followed by a brief period of confusion. A coworker told family that the patient had left arm tucked close to her chest in a weird position but was not shaking. No urine/stool incontinence. EMS was called and en route while nurse was trying to place an IV pt got anxiety attack which resolved in the ED. She denied any symptoms prior to that. No chest pain/palpitations/dyspnea.    Subjective:    [name] today has tingling in her left >> right arm.  And slight headache, otherwise doing well.  No focal weakness, no further syncope.  Reviewed records,  no orthostatic vs,  Will order.     No chest pain, No abdominal pain - No Nausea, No new weakness tingling or numbness, No Cough - SOB.    Assessment  & Plan :    Active Problems:   Left arm numbness   Syncope Etiology is not clear to me but in ddx : seizure (confusion after passing out, numbness in left arm, tonic posture), anxiety attack  Tele Trop I q6h x3 MRI brain/cervical spine EEG Carotid US Cardiac echo Neurology consulted, appreciate input Cardiology  consulted,  ? May need outpatient holter.  Appreciate input  Hypothyroidism:  cont synthroid   Anxiety: cont home meds  Hypokalemia: replaced Check cmp in am    Code Status : FULL CODE  Family Communication  : w patient  Disposition Plan  : home  Barriers For Discharge :   Consults  :  neurology  Procedures  :  CXR 9/17=> no acute process MRI brain 9/17=> normal brain MRI MRI c spine=> small central disc protrusion at C4-5 without stenosis   DVT Prophylaxis  :  Lovenox -SCDs   Lab Results  Component Value Date   PLT 266 04/30/2017    Antibiotics  :  none  Anti-infectives    None        Objective:   Vitals:   04/30/17 1500 04/30/17 1552 04/30/17 1714 05/01/17 0506  BP: 136/81 (!) 152/81 127/82 108/62  Pulse: 76 93 75 61  Resp: Temp:  98.3 F (36.8 C) 98.5 F (36.9 C) 98.5 F (36.9 C)  TempSrc:  Oral  Oral  SpO2: 100% 98% 97% 98%  Weight:   56.4 kg (124 lb 5.4 oz)   Height:    (1.676 m)     Wt Readings from Last 3 Encounters:  04/30/17 56.4 kg (124 lb 5.4 oz)  04/30/17 56.2 kg (124 lb)  03/08/17 54.9 kg (121 lb)     Intake/Output Summary (Last 24 hours) at 05/01/17 0640 Last data filed at 05/01/17 0200  Gross per 24 hour  Intake              259 ml  Output                0 ml  Net              259 ml     Physical Exam  Awake Alert, Oriented X 3, No new F.N deficits, Normal affect [place].AT,PERRAL Supple Neck,No JVD, No cervical lymphadenopathy appriciated.  Symmetrical Chest wall movement, Good air movement bilaterally, CTAB RRR,No Gallops,Rubs or new Murmurs, No Parasternal Heave +ve B.Sounds, Abd Soft, No tenderness, No organomegaly appriciated, No rebound - guarding or rigidity. No Cyanosis, Clubbing or edema, No new Rash or bruise      Data Review:    CBC  Recent Labs Lab 04/30/17 1255  WBC 8.3  HGB 11.2*  HCT 34.1*  PLT 266  MCV 85.3  MCH 28.0  MCHC 32.8  RDW 13.5  LYMPHSABS 1.6  MONOABS 0.8    EOSABS 0.1  BASOSABS 0.1    Chemistries   Recent Labs Lab 04/30/17 1255 05/01/17 0528  NA 141 140  K 3.1* 3.8  CL 106 107  CO2 25 26  GLUCOSE 84 93  BUN 17 13  CREATININE 0.80 0.68  CALCIUM 9.3 8.9  AST 32  --   ALT 28  --   ALKPHOS 95  --   BILITOT 0.7  --    ------------------------------------------------------------------------------------------------------------------ No results for input(s): CHOL, HDL, LDLCALC, TRIG, CHOLHDL, LDLDIRECT in the last 72 hours.  No results found for: HGBA1C ------------------------------------------------------------------------------------------------------------------ No results for input(s): TSH, T4TOTAL, T3FREE, THYROIDAB in the last 72 hours.  Invalid input(s): FREET3 ------------------------------------------------------------------------------------------------------------------ No results for input(s): VITAMINB12, FOLATE, FERRITIN, TIBC, IRON, RETICCTPCT in the last 72 hours.  Coagulation profile No results for input(s): INR, PROTIME in the last 168 hours.  No results for input(s): DDIMER in the last 72 hours.  Cardiac Enzymes  Recent Labs Lab 04/30/17 1255 04/30/17 1729 04/30/17 2322  TROPONINI <0.03 <0.03 <0.03   ------------------------------------------------------------------------------------------------------------------  No results found for: BNP  Inpatient Medications  Scheduled Meds: . aspirin  325 mg Oral Daily  . buPROPion  300 mg Oral Daily  . enoxaparin (LOVENOX) injection  40 mg Subcutaneous Q24H  . Influenza vac split quadrivalent PF  0.5 mL Intramuscular Tomorrow-1000  . levothyroxine  150 mcg Oral QAC breakfast  . pantoprazole  40 mg Oral Daily   Continuous Infusions: . sodium chloride 20 mL/hr at 04/30/17 1303   PRN Meds:.LORazepam  Micro Results No results found for this or any previous visit (from the past 240 hour(s)).  Radiology Reports Dg Chest 2 View  Result Date:  04/30/2017 CLINICAL DATA:  Syncope EXAM: CHEST  2 VIEW COMPARISON:  CT chest dated 10/20/2015 FINDINGS: Lungs are clear.  No pleural effusion or pneumothorax. The heart is normal in size. Visualized osseous structures are within normal limits. IMPRESSION: Normal chest radiographs. Electronically Signed   By: Charline Bills M.D.   On: 04/30/2017 13:45   Mr Brain Wo Contrast  Result Date: 04/30/2017 CLINICAL DATA:  Acute onset left upper extremity numbness and tingling. EXAM: MRI HEAD WITHOUT CONTRAST MRI CERVICAL SPINE WITHOUT CONTRAST TECHNIQUE: Multiplanar, multiecho pulse sequences of the brain and surrounding structures, and cervical spine, to include the craniocervical junction and cervicothoracic junction, were obtained without intravenous contrast. COMPARISON:  None available. FINDINGS: MRI HEAD FINDINGS Brain: Study moderately degraded by motion. Cerebral volume within normal limits for age. No significant cerebral white matter changes. No abnormal foci of restricted diffusion to suggest acute or subacute ischemia. No encephalomalacia to suggest chronic infarction. No susceptibility artifact to suggest acute or chronic intracranial hemorrhage. No mass lesion, midline shift or mass effect. No hydrocephalus. No extra-axial fluid collection. Pituitary gland and suprasellar region within normal limits. Midline structures intact and normal. Major dural sinuses grossly patent. Vascular: Major intracranial vascular flow voids grossly maintained. Skull and upper cervical spine: Craniocervical junction normal. Upper cervical spine within normal limits. Bone marrow signal intensity normal. No scalp soft tissue abnormality. Sinuses/Orbits: Globes and orbital soft tissues within normal limits. Paranasal sinuses clear. No mastoid effusion. Inner ear structures grossly normal. Other: None. MRI CERVICAL SPINE FINDINGS Alignment: Study mildly degraded by motion artifact. Vertebral bodies normally aligned with  preservation of the normal cervical lordosis. No listhesis. Vertebrae: Vertebral body heights well maintained. No evidence for acute or chronic fracture. Bone marrow signal intensity within normal limits. No discrete or worrisome osseous lesions. No abnormal marrow edema. Cord: Signal intensity within the cervical spinal cord is normal. Posterior Fossa, vertebral arteries, paraspinal tissues: Posterior and paraspinous soft tissues within normal limits. Normal intravascular flow voids present within the vertebral arteries bilaterally. Disc levels: C2-C3: Unremarkable. C3-C4:  Unremarkable. C4-C5: Small 3 mm central disc protrusion indents the ventral thecal sac. No significant canal or neural foraminal stenosis. C5-C6:  Unremarkable. C6-C7:  Unremarkable. C7-T1:  Unremarkable. Visualized upper thoracic spine is unremarkable. IMPRESSION: MRI HEAD IMPRESSION: Normal brain MRI.  No acute intracranial abnormality identified. MRI CERVICAL SPINE IMPRESSION: 1. Small central disc protrusion at C4-5 without stenosis. 2. Otherwise unremarkable MRI of the cervical spine. No findings to explain patient's symptoms identified. Electronically Signed   By: Rise Mu M.D.   On: 04/30/2017 20:48   Mr Cervical Spine Wo Contrast  Result Date: 04/30/2017 CLINICAL DATA:  Acute onset left upper extremity numbness and tingling. EXAM: MRI HEAD WITHOUT CONTRAST MRI CERVICAL SPINE WITHOUT CONTRAST TECHNIQUE: Multiplanar, multiecho pulse sequences of the brain and surrounding structures, and cervical spine, to include the craniocervical  junction and cervicothoracic junction, were obtained without intravenous contrast. COMPARISON:  None available. FINDINGS: MRI HEAD FINDINGS Brain: Study moderately degraded by motion. Cerebral volume within normal limits for age. No significant cerebral white matter changes. No abnormal foci of restricted diffusion to suggest acute or subacute ischemia. No encephalomalacia to suggest chronic  infarction. No susceptibility artifact to suggest acute or chronic intracranial hemorrhage. No mass lesion, midline shift or mass effect. No hydrocephalus. No extra-axial fluid collection. Pituitary gland and suprasellar region within normal limits. Midline structures intact and normal. Major dural sinuses grossly patent. Vascular: Major intracranial vascular flow voids grossly maintained. Skull and upper cervical spine: Craniocervical junction normal. Upper cervical spine within normal limits. Bone marrow signal intensity normal. No scalp soft tissue abnormality. Sinuses/Orbits: Globes and orbital soft tissues within normal limits. Paranasal sinuses clear. No mastoid effusion. Inner ear structures grossly normal. Other: None. MRI CERVICAL SPINE FINDINGS Alignment: Study mildly degraded by motion artifact. Vertebral bodies normally aligned with preservation of the normal cervical lordosis. No listhesis. Vertebrae: Vertebral body heights well maintained. No evidence for acute or chronic fracture. Bone marrow signal intensity within normal limits. No discrete or worrisome osseous lesions. No abnormal marrow edema. Cord: Signal intensity within the cervical spinal cord is normal. Posterior Fossa, vertebral arteries, paraspinal tissues: Posterior and paraspinous soft tissues within normal limits. Normal intravascular flow voids present within the vertebral arteries bilaterally. Disc levels: C2-C3: Unremarkable. C3-C4:  Unremarkable. C4-C5: Small 3 mm central disc protrusion indents the ventral thecal sac. No significant canal or neural foraminal stenosis. C5-C6:  Unremarkable. C6-C7:  Unremarkable. C7-T1:  Unremarkable. Visualized upper thoracic spine is unremarkable. IMPRESSION: MRI HEAD IMPRESSION: Normal brain MRI.  No acute intracranial abnormality identified. MRI CERVICAL SPINE IMPRESSION: 1. Small central disc protrusion at C4-5 without stenosis. 2. Otherwise unremarkable MRI of the cervical spine. No findings to  explain patient's symptoms identified. Electronically Signed   By: Rise Mu M.D.   On: 04/30/2017 20:48    Time Spent in minutes  30   Pearson Grippe M.D on 05/01/2017 at 6:40 AM  Between 7am to 7pm - Pager - [phone]  After 7pm go to [email or web address] - password [email or web address]  [facility] -  Office  [phone]

## 2017-05-01 NOTE — Progress Notes (Signed)
Pt C/O tingling in Bi hands. Intensity is intermittent. Grip is mildly weak. On-call notified. No new orders.

## 2017-05-01 NOTE — Progress Notes (Signed)
EEG completed, results pending. 

## 2017-05-02 ENCOUNTER — Other Ambulatory Visit: Payer: Self-pay | Admitting: Physician Assistant

## 2017-05-02 ENCOUNTER — Inpatient Hospital Stay (HOSPITAL_COMMUNITY): Payer: BLUE CROSS/BLUE SHIELD

## 2017-05-02 DIAGNOSIS — R55 Syncope and collapse: Secondary | ICD-10-CM

## 2017-05-02 DIAGNOSIS — R9431 Abnormal electrocardiogram [ECG] [EKG]: Secondary | ICD-10-CM

## 2017-05-02 LAB — COMPREHENSIVE METABOLIC PANEL
ALT: 34 U/L (ref 14–54)
AST: 47 U/L — ABNORMAL HIGH (ref 15–41)
Albumin: 3.5 g/dL (ref 3.5–5.0)
Alkaline Phosphatase: 89 U/L (ref 38–126)
Anion gap: 8 (ref 5–15)
BUN: 11 mg/dL (ref 6–20)
CO2: 24 mmol/L (ref 22–32)
Calcium: 9.1 mg/dL (ref 8.9–10.3)
Chloride: 107 mmol/L (ref 101–111)
Creatinine, Ser: 0.69 mg/dL (ref 0.44–1.00)
GFR calc Af Amer: >60 mL/min (ref >60)
GFR calc non Af Amer: >60 mL/min (ref >60)
Glucose, Bld: 89 mg/dL (ref 65–99)
Potassium: 4 mmol/L (ref 3.5–5.1)
Sodium: 139 mmol/L (ref 135–145)
Total Bilirubin: 0.6 mg/dL (ref 0.3–1.2)
Total Protein: 6.2 g/dL — ABNORMAL LOW (ref 6.5–8.1)

## 2017-05-02 LAB — VAS US CAROTID
LEFT ECA DIAS: -16 cm/s
LEFT VERTEBRAL DIAS: -17 cm/s
Left CCA dist dias: -35 cm/s
Left CCA dist sys: -88 cm/s
Left CCA prox dias: 20 cm/s
Left CCA prox sys: 81 cm/s
Left ICA dist dias: -28 cm/s
Left ICA dist sys: -71 cm/s
Left ICA prox dias: -20 cm/s
Left ICA prox sys: -86 cm/s
RIGHT ECA DIAS: -14 cm/s
RIGHT VERTEBRAL DIAS: -15 cm/s
Right CCA prox dias: 20 cm/s
Right CCA prox sys: 81 cm/s
Right cca dist sys: -98 cm/s

## 2017-05-02 LAB — CBC
HCT: 32.6 % — ABNORMAL LOW (ref 36.0–46.0)
Hemoglobin: 10.5 g/dL — ABNORMAL LOW (ref 12.0–15.0)
MCH: 28.5 pg (ref 26.0–34.0)
MCHC: 32.2 g/dL (ref 30.0–36.0)
MCV: 88.3 fL (ref 78.0–100.0)
Platelets: 235 10*3/uL (ref 150–400)
RBC: 3.69 MIL/uL — ABNORMAL LOW (ref 3.87–5.11)
RDW: 14.2 % (ref 11.5–15.5)
WBC: 4.5 10*3/uL (ref 4.0–10.5)

## 2017-05-02 NOTE — Progress Notes (Signed)
Pt given discharge teaching including medications. Pt verbalized understanding of all discharge instructions. VSS Pt discharged to home via wheelchair with discharge packet.

## 2017-05-02 NOTE — Progress Notes (Signed)
VASCULAR LAB PRELIMINARY  PRELIMINARY  PRELIMINARY  PRELIMINARY  Carotid duplex completed.     Preliminary report: Rt - No evidence of ICA stenosis. Vertebral artery flow is antegrade.. 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 Possibly due to mild tortuosity at the site. Vertebral artery flow is antegrade.  Wilkins Elpers, RVS 05/02/2017, 8:43 AM

## 2017-05-02 NOTE — Progress Notes (Signed)
Progress Note  Patient Name: [name] Case Headings Date of Encounter: 05/02/2017  Primary Cardiologist: new - Dr. Elease Hashimoto  Subjective   No further syncope. Echo and CT coronary completed. Carotid doppler read pending. She is ready for discharge.  Inpatient Medications    Scheduled Meds: . aspirin  325 mg Oral Daily  . buPROPion  300 mg Oral Daily  . dexlansoprazole  60 mg Oral Daily  . enoxaparin (LOVENOX) injection  40 mg Subcutaneous Q24H  . levothyroxine  150 mcg Oral QAC breakfast   Continuous Infusions: . sodium chloride 20 mL/hr at 05/02/17 0706   PRN Meds: acetaminophen, alum & mag hydroxide-simeth, LORazepam   Vital Signs    Vitals:   05/01/17 1428 05/01/17 1756 05/01/17 2142 05/02/17 0545  BP: 125/70 107/72 110/67 (!) 108/54  Pulse: 72 64 (!) 55 (!) 58  Resp: Temp: 99 F (37.2 C) 98.4 F (36.9 C) 97.8 F (36.6 C) 97.9 F (36.6 C)  TempSrc: Oral Oral Oral Oral  SpO2: 99% 99% 99% 100%  Weight:      Height:        Intake/Output Summary (Last 24 hours) at 05/02/17 0951 Last data filed at 05/01/17 1800  Gross per 24 hour  Intake              720 ml  Output                0 ml  Net              720 ml   Filed Weights   04/30/17 1714  Weight: 124 lb 5.4 oz (56.4 kg)     Physical Exam   General: Well developed, well nourished, female appearing in no acute distress. Head: Normocephalic, atraumatic.  Neck: Supple without bruits, no JVD. Lungs:  Resp regular and unlabored, CTA. Heart: RRR, S1, S2, no murmur; no rub. Abdomen: Soft, non-tender, non-distended with normoactive bowel sounds. No hepatomegaly. No rebound/guarding. No obvious abdominal masses. Extremities: No clubbing, cyanosis, no edema. Distal pedal pulses are 2+ bilaterally. Neuro: Alert and oriented X 3. Moves all extremities spontaneously. Psych: Normal affect.  Labs    Chemistry Recent Labs Lab 04/30/17 1255 05/01/17 0528 05/02/17 0452  NA 141 140 139  K 3.1* 3.8 4.0    CL 106 107 107  CO2 GLUCOSE 84 93 89  BUN CREATININE 0.80 0.68 0.69  CALCIUM 9.3 8.9 9.1  PROT 7.1  --  6.2*  ALBUMIN 4.2  --  3.5  AST 32  --  47*  ALT 28  --  34  ALKPHOS 95  --  89  BILITOT 0.7  --  0.6  GFRNONAA >60 >60 >60  GFRAA >60 >60 >60  ANIONGAP Hematology Recent Labs Lab 04/30/17 1255 05/02/17 0452  WBC 8.3 4.5  RBC 4.00 3.69*  HGB 11.2* 10.5*  HCT 34.1* 32.6*  MCV 85.3 88.3  MCH 28.0 28.5  MCHC 32.8 32.2  RDW 13.5 14.2  PLT 266 235    Cardiac Enzymes Recent Labs Lab 04/30/17 1255 04/30/17 1729 04/30/17 2322  TROPONINI <0.03 <0.03 <0.03   No results for input(s): TROPIPOC in the last 168 hours.   BNPNo results for input(s): BNP, PROBNP in the last 168 hours.   DDimer No results for input(s): DDIMER in the last 168 hours.   Radiology    Dg Chest 2 View  Result Date: 04/30/2017 CLINICAL DATA:  Syncope EXAM: CHEST  2 VIEW COMPARISON:  CT chest dated 10/20/2015 FINDINGS: Lungs are clear.  No pleural effusion or pneumothorax. The heart is normal in size. Visualized osseous structures are within normal limits. IMPRESSION: Normal chest radiographs. Electronically Signed   By: Charline Bills M.D.   On: 04/30/2017 13:45   Mr Brain Wo Contrast  Result Date: 04/30/2017 CLINICAL DATA:  Acute onset left upper extremity numbness and tingling. EXAM: MRI HEAD WITHOUT CONTRAST MRI CERVICAL SPINE WITHOUT CONTRAST TECHNIQUE: Multiplanar, multiecho pulse sequences of the brain and surrounding structures, and cervical spine, to include the craniocervical junction and cervicothoracic junction, were obtained without intravenous contrast. COMPARISON:  None available. FINDINGS: MRI HEAD FINDINGS Brain: Study moderately degraded by motion. Cerebral volume within normal limits for age. No significant cerebral white matter changes. No abnormal foci of restricted diffusion to suggest acute or subacute ischemia. No encephalomalacia to suggest  chronic infarction. No susceptibility artifact to suggest acute or chronic intracranial hemorrhage. No mass lesion, midline shift or mass effect. No hydrocephalus. No extra-axial fluid collection. Pituitary gland and suprasellar region within normal limits. Midline structures intact and normal. Major dural sinuses grossly patent. Vascular: Major intracranial vascular flow voids grossly maintained. Skull and upper cervical spine: Craniocervical junction normal. Upper cervical spine within normal limits. Bone marrow signal intensity normal. No scalp soft tissue abnormality. Sinuses/Orbits: Globes and orbital soft tissues within normal limits. Paranasal sinuses clear. No mastoid effusion. Inner ear structures grossly normal. Other: None. MRI CERVICAL SPINE FINDINGS Alignment: Study mildly degraded by motion artifact. Vertebral bodies normally aligned with preservation of the normal cervical lordosis. No listhesis. Vertebrae: Vertebral body heights well maintained. No evidence for acute or chronic fracture. Bone marrow signal intensity within normal limits. No discrete or worrisome osseous lesions. No abnormal marrow edema. Cord: Signal intensity within the cervical spinal cord is normal. Posterior Fossa, vertebral arteries, paraspinal tissues: Posterior and paraspinous soft tissues within normal limits. Normal intravascular flow voids present within the vertebral arteries bilaterally. Disc levels: C2-C3: Unremarkable. C3-C4:  Unremarkable. C4-C5: Small 3 mm central disc protrusion indents the ventral thecal sac. No significant canal or neural foraminal stenosis. C5-C6:  Unremarkable. C6-C7:  Unremarkable. C7-T1:  Unremarkable. Visualized upper thoracic spine is unremarkable. IMPRESSION: MRI HEAD IMPRESSION: Normal brain MRI.  No acute intracranial abnormality identified. MRI CERVICAL SPINE IMPRESSION: 1. Small central disc protrusion at C4-5 without stenosis. 2. Otherwise unremarkable MRI of the cervical spine. No  findings to explain patient's symptoms identified. Electronically Signed   By: Rise Mu M.D.   On: 04/30/2017 20:48   Mr Cervical Spine Wo Contrast  Result Date: 04/30/2017 CLINICAL DATA:  Acute onset left upper extremity numbness and tingling. EXAM: MRI HEAD WITHOUT CONTRAST MRI CERVICAL SPINE WITHOUT CONTRAST TECHNIQUE: Multiplanar, multiecho pulse sequences of the brain and surrounding structures, and cervical spine, to include the craniocervical junction and cervicothoracic junction, were obtained without intravenous contrast. COMPARISON:  None available. FINDINGS: MRI HEAD FINDINGS Brain: Study moderately degraded by motion. Cerebral volume within normal limits for age. No significant cerebral white matter changes. No abnormal foci of restricted diffusion to suggest acute or subacute ischemia. No encephalomalacia to suggest chronic infarction. No susceptibility artifact to suggest acute or chronic intracranial hemorrhage. No mass lesion, midline shift or mass effect. No hydrocephalus. No extra-axial fluid collection. Pituitary gland and suprasellar region within normal limits. Midline structures intact and normal. Major dural sinuses grossly patent. Vascular: Major intracranial vascular flow voids grossly maintained. Skull and  upper cervical spine: Craniocervical junction normal. Upper cervical spine within normal limits. Bone marrow signal intensity normal. No scalp soft tissue abnormality. Sinuses/Orbits: Globes and orbital soft tissues within normal limits. Paranasal sinuses clear. No mastoid effusion. Inner ear structures grossly normal. Other: None. MRI CERVICAL SPINE FINDINGS Alignment: Study mildly degraded by motion artifact. Vertebral bodies normally aligned with preservation of the normal cervical lordosis. No listhesis. Vertebrae: Vertebral body heights well maintained. No evidence for acute or chronic fracture. Bone marrow signal intensity within normal limits. No discrete or  worrisome osseous lesions. No abnormal marrow edema. Cord: Signal intensity within the cervical spinal cord is normal. Posterior Fossa, vertebral arteries, paraspinal tissues: Posterior and paraspinous soft tissues within normal limits. Normal intravascular flow voids present within the vertebral arteries bilaterally. Disc levels: C2-C3: Unremarkable. C3-C4:  Unremarkable. C4-C5: Small 3 mm central disc protrusion indents the ventral thecal sac. No significant canal or neural foraminal stenosis. C5-C6:  Unremarkable. C6-C7:  Unremarkable. C7-T1:  Unremarkable. Visualized upper thoracic spine is unremarkable. IMPRESSION: MRI HEAD IMPRESSION: Normal brain MRI.  No acute intracranial abnormality identified. MRI CERVICAL SPINE IMPRESSION: 1. Small central disc protrusion at C4-5 without stenosis. 2. Otherwise unremarkable MRI of the cervical spine. No findings to explain patient's symptoms identified. Electronically Signed   By: Rise Mu M.D.   On: 04/30/2017 20:48   Ct Coronary Morph W/cta Cor W/score W/ca W/cm &/or Wo/cm  Addendum Date: 05/02/2017   ADDENDUM REPORT: 05/02/2017 08:15 EXAM: OVER-READ INTERPRETATION  CT CHEST The following report is an over-read performed by radiologist Dr. Noe Gens [facility] on 05/02/2017. This over-read does not include interpretation of cardiac or coronary anatomy or pathology. The coronary CTA interpretation by the cardiologist is attached. COMPARISON:  chest CT 10/20/2015 FINDINGS: Heart is borderline in size.  Visualized aorta normal caliber. No adenopathy in the lower mediastinum or hila. Lungs are clear.  No effusions. Imaging into the upper abdomen shows no acute findings. Chest wall soft tissues are unremarkable. No acute bony abnormality. IMPRESSION: No acute or significant extracardiac abnormality. Electronically Signed   By: Charlett Nose M.D.   On: 05/02/2017 08:15   Result Date: 05/02/2017 CLINICAL DATA:  Chest pain EXAM: Cardiac CTA  MEDICATIONS: Sub lingual nitro .  and lopressor  IV TECHNIQUE: The patient was scanned on a Siemens 192 slice scanner. Gantry rotation speed was 250 msecs. Collimation was .6 mm. A 100 kV prospective scan was triggered in the acending thoracic aorta in adaptive fashion at 65-75% of the R-R interval. Average HR during the scan was 65 bpm. The 3D data set was interpreted on a dedicated work station using MPR, MIP and VRT modes. A total of 80cc of contrast was used. FINDINGS: Non-cardiac: See separate report from [facility]. Calcium Score:  0 Agatston units. Coronary Arteries: Right dominant with no anomalies LM:  Short, no significant disease. LAD system:  No plaque or stenosis. Circumflex system:  No plaque or stenosis. RCA system:  No plaque or stenosis. IMPRESSION: 1. Coronary artery calcium score of 0 Agatston units, suggesting low risk for future cardiac events. 2.  No significant coronary disease noted on CT angiography. [employer] account executive Electronically Signed: By: Marca Ancona M.D. On: 05/01/2017 17:04     Telemetry    Sinus rhythm - Personally Reviewed  ECG    Sinus rhythm, TWI have resolved - Personally Reviewed   Cardiac Studies   Carotid dopplers: pending  Echo 05/01/17: Study Conclusions - Left ventricle: The cavity size was normal. Systolic  function was   normal. The estimated ejection fraction was in the range of 55%   to 60%. Wall motion was normal; there were no regional wall   motion abnormalities. Left ventricular diastolic function   parameters were normal. - Mitral valve: Mild, late systolic prolapse, involving the anterior   leaflet.  CT coronary 05/01/17: 1. Coronary artery calcium score of 0 Agatston units, suggesting low risk for future cardiac events. 2.  No significant coronary disease noted on CT angiography.   Patient Profile     56 y.o. female with a hx of anxiety  who is being seen for the evaluation of an episode of syncope  Assessment &  Plan    1. Syncope - coronary CT was negative for significant calcium  - echo with normal function but late systolic prolapse - it is unlikely that this episode of syncope was mediated by an ischemic process - will arrange follow up in our office with placement of an event monitor to detect arrhythmias that may be occurring and responsible for her syncopal episode   2. Hypokalemia - now resolved - all other labs WNL   3. Hyperlipidemia - LDL 103, total cholesterol 222, HDL 109 - consider adding low dose statin   Signed, Marcelino Duster , PA-C 9:51 AM 05/02/2017 Pager: [phone]  Attending Note:   The patient was seen and examined.  Agree with assessment and plan as noted above.  Changes made to the above note as needed.  Patient seen and independently examined with Bettina Gavia , PA .   We discussed all aspects of the encounter. I agree with the assessment and plan as stated above.  1.  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 Chest pain / ECG changes:  Normal coronay CT     I have spent a total of 40 minutes with patient reviewing hospital  notes , telemetry, EKGs, labs and examining patient as well as establishing an assessment and plan that was discussed with the patient. > 50% of time was spent in direct patient care.    Vesta Mixer, Montez Hageman., MD, [facility] 05/02/2017, 12:26 PM [address]  [address] Office [phone] Pager [phone]

## 2017-05-02 NOTE — Discharge Summary (Addendum)
Physician Discharge Summary  [name] Case:[id number] DOB: 26-Sep-1960 DOA: 04/30/2017  PCP: Shelva Majestic, MD  Admit date: 04/30/2017 Discharge date: 05/02/2017  Admitted From: Home Disposition:  Home  Recommendations for Outpatient Follow-up:  1. Follow up with PCP in 1-2 weeks 2. Please obtain BMP/CBC in one week 3. Follow up with Cardiology 05/16/2017 for Holter monitoring 4. Follow up with Neurology for outpatient NCS/EMG in 2 weeks  Home Health:NO Equipment/Devices:None Discharge Condition:Stable CODE STATUS:Full Diet recommendation: Heart Healthy  Brief/Interim Summary: This is a 56 yo female with hx of anxiety who was in her usual state of health on the day of admission while sitting at her desk trying to send an email and suddenly had a syncopal episode of a few seconds followed by a brief period of confusion. A coworker told family that the patient had left arm tucked close to her chest in a weird position but was not shaking. No urine/stool incontinence. EMS was called and en route while nurse was trying to place an IV pt got anxiety attack which resolved in the ED. She underwent MRI of the brain and cervical spine with no acute findings noted. In addition she underwent carotid duplex, 2-D echocardiogram, and EEG; all with no acute findings noted. She was seen in consultation by neurology as well as cardiology with no significant findings noted. She is, however noted to have findings significant for bilateral carpal tunnel syndrome which will require further evaluation in the outpatient setting with neurology. Cardiology does plan to perform Holter monitoring in the near future. She is currently otherwise asymptomatic and ready for discharge. No other acute events have been noted during the course of this hospitalization.  Discharge Diagnoses:  Active Problems:   Left arm numbness   Syncope and collapse   Abnormal EKG L arm numbness due to suspected carpal tunnel  syndrome Syncope of undetermined etiology  Discharge Instructions  Discharge Instructions    Ambulatory referral to Neurology    Complete by:  As directed    An appointment is requested in approximately: 2 weeks for NCV bilateral wrist for possible carpel tunnel (bilateral finger tingling)   Diet - low sodium heart healthy    Complete by:  As directed    Increase activity slowly    Complete by:  As directed      Allergies as of 05/02/2017      Reactions   Sulfamethoxazole    REACTION: hives   Tetracycline Hcl    REACTION: ulcers in throat      Medication List    TAKE these medications   buPROPion 300 MG 24 hr tablet Commonly known as:  WELLBUTRIN XL Take 300 mg by mouth daily.   dexlansoprazole 60 MG capsule Commonly known as:  DEXILANT Take 1 capsule (60 mg total) by mouth daily.   levothyroxine 150 MCG tablet Commonly known as:  SYNTHROID, LEVOTHROID TAKE 1 TABLET BY MOUTH DAILY   naproxen sodium 220 MG tablet Commonly known as:  ANAPROX Take 440 mg by mouth daily as needed (back pain).            Discharge Care Instructions        Start     Ordered   05/02/17 0000  Increase activity slowly     05/02/17 1143   05/02/17 0000  Diet - low sodium heart healthy     05/02/17 1143   05/01/17 0000  Ambulatory referral to Neurology    Comments:  An appointment is requested  in approximately: 2 weeks for NCV bilateral wrist for possible carpel tunnel (bilateral finger tingling)   05/01/17 0907     Follow-up Information    Manson Passey, PA Follow up on 05/16/2017.   Specialty:  Cardiology Why:  1:30 pm hospital follow up  Contact information: [address] [place] [address] [phone]        [facility] [address] [facility] Follow up on 05/16/2017.   Specialty:  Cardiology Why:  3:30 pm appt for holter placement Contact information: [address] [place] [address] [phone]       neurology  Follow up in 2 week(s).        Shelva Majestic, MD Follow up in 2 week(s).   Specialty:  Family Medicine Contact information: [address] [place] [address] [phone]          Allergies  Allergen Reactions  . Sulfamethoxazole     REACTION: hives  . Tetracycline Hcl     REACTION: ulcers in throat    Consultations:  Cardiology Dr. Elease Hashimoto  Neurology Dr. Wilford Corner   Procedures/Studies: Dg Chest 2 View  Result Date: 04/30/2017 CLINICAL DATA:  Syncope EXAM: CHEST  2 VIEW COMPARISON:  CT chest dated 10/20/2015 FINDINGS: Lungs are clear.  No pleural effusion or pneumothorax. The heart is normal in size. Visualized osseous structures are within normal limits. IMPRESSION: Normal chest radiographs. Electronically Signed   By: Charline Bills M.D.   On: 04/30/2017 13:45   Mr Brain Wo Contrast  Result Date: 04/30/2017 CLINICAL DATA:  Acute onset left upper extremity numbness and tingling. EXAM: MRI HEAD WITHOUT CONTRAST MRI CERVICAL SPINE WITHOUT CONTRAST TECHNIQUE: Multiplanar, multiecho pulse sequences of the brain and surrounding structures, and cervical spine, to include the craniocervical junction and cervicothoracic junction, were obtained without intravenous contrast. COMPARISON:  None available. FINDINGS: MRI HEAD FINDINGS Brain: Study moderately degraded by motion. Cerebral volume within normal limits for age. No significant cerebral white matter changes. No abnormal foci of restricted diffusion to suggest acute or subacute ischemia. No encephalomalacia to suggest chronic infarction. No susceptibility artifact to suggest acute or chronic intracranial hemorrhage. No mass lesion, midline shift or mass effect. No hydrocephalus. No extra-axial fluid collection. Pituitary gland and suprasellar region within normal limits. Midline structures intact and normal. Major dural sinuses grossly patent. Vascular: Major intracranial vascular flow voids grossly maintained. Skull and  upper cervical spine: Craniocervical junction normal. Upper cervical spine within normal limits. Bone marrow signal intensity normal. No scalp soft tissue abnormality. Sinuses/Orbits: Globes and orbital soft tissues within normal limits. Paranasal sinuses clear. No mastoid effusion. Inner ear structures grossly normal. Other: None. MRI CERVICAL SPINE FINDINGS Alignment: Study mildly degraded by motion artifact. Vertebral bodies normally aligned with preservation of the normal cervical lordosis. No listhesis. Vertebrae: Vertebral body heights well maintained. No evidence for acute or chronic fracture. Bone marrow signal intensity within normal limits. No discrete or worrisome osseous lesions. No abnormal marrow edema. Cord: Signal intensity within the cervical spinal cord is normal. Posterior Fossa, vertebral arteries, paraspinal tissues: Posterior and paraspinous soft tissues within normal limits. Normal intravascular flow voids present within the vertebral arteries bilaterally. Disc levels: C2-C3: Unremarkable. C3-C4:  Unremarkable. C4-C5: Small 3 mm central disc protrusion indents the ventral thecal sac. No significant canal or neural foraminal stenosis. C5-C6:  Unremarkable. C6-C7:  Unremarkable. C7-T1:  Unremarkable. Visualized upper thoracic spine is unremarkable. IMPRESSION: MRI HEAD IMPRESSION: Normal brain MRI.  No acute intracranial  abnormality identified. MRI CERVICAL SPINE IMPRESSION: 1. Small central disc protrusion at C4-5 without stenosis. 2. Otherwise unremarkable MRI of the cervical spine. No findings to explain patient's symptoms identified. Electronically Signed   By: Rise Mu M.D.   On: 04/30/2017 20:48   Mr Cervical Spine Wo Contrast  Result Date: 04/30/2017 CLINICAL DATA:  Acute onset left upper extremity numbness and tingling. EXAM: MRI HEAD WITHOUT CONTRAST MRI CERVICAL SPINE WITHOUT CONTRAST TECHNIQUE: Multiplanar, multiecho pulse sequences of the brain and surrounding  structures, and cervical spine, to include the craniocervical junction and cervicothoracic junction, were obtained without intravenous contrast. COMPARISON:  None available. FINDINGS: MRI HEAD FINDINGS Brain: Study moderately degraded by motion. Cerebral volume within normal limits for age. No significant cerebral white matter changes. No abnormal foci of restricted diffusion to suggest acute or subacute ischemia. No encephalomalacia to suggest chronic infarction. No susceptibility artifact to suggest acute or chronic intracranial hemorrhage. No mass lesion, midline shift or mass effect. No hydrocephalus. No extra-axial fluid collection. Pituitary gland and suprasellar region within normal limits. Midline structures intact and normal. Major dural sinuses grossly patent. Vascular: Major intracranial vascular flow voids grossly maintained. Skull and upper cervical spine: Craniocervical junction normal. Upper cervical spine within normal limits. Bone marrow signal intensity normal. No scalp soft tissue abnormality. Sinuses/Orbits: Globes and orbital soft tissues within normal limits. Paranasal sinuses clear. No mastoid effusion. Inner ear structures grossly normal. Other: None. MRI CERVICAL SPINE FINDINGS Alignment: Study mildly degraded by motion artifact. Vertebral bodies normally aligned with preservation of the normal cervical lordosis. No listhesis. Vertebrae: Vertebral body heights well maintained. No evidence for acute or chronic fracture. Bone marrow signal intensity within normal limits. No discrete or worrisome osseous lesions. No abnormal marrow edema. Cord: Signal intensity within the cervical spinal cord is normal. Posterior Fossa, vertebral arteries, paraspinal tissues: Posterior and paraspinous soft tissues within normal limits. Normal intravascular flow voids present within the vertebral arteries bilaterally. Disc levels: C2-C3: Unremarkable. C3-C4:  Unremarkable. C4-C5: Small 3 mm central disc  protrusion indents the ventral thecal sac. No significant canal or neural foraminal stenosis. C5-C6:  Unremarkable. C6-C7:  Unremarkable. C7-T1:  Unremarkable. Visualized upper thoracic spine is unremarkable. IMPRESSION: MRI HEAD IMPRESSION: Normal brain MRI.  No acute intracranial abnormality identified. MRI CERVICAL SPINE IMPRESSION: 1. Small central disc protrusion at C4-5 without stenosis. 2. Otherwise unremarkable MRI of the cervical spine. No findings to explain patient's symptoms identified. Electronically Signed   By: Rise Mu M.D.   On: 04/30/2017 20:48   Ct Coronary Morph W/cta Cor W/score W/ca W/cm &/or Wo/cm  Addendum Date: 05/02/2017   ADDENDUM REPORT: 05/02/2017 08:15 EXAM: OVER-READ INTERPRETATION  CT CHEST The following report is an over-read performed by radiologist Dr. Noe Gens [facility] on 05/02/2017. This over-read does not include interpretation of cardiac or coronary anatomy or pathology. The coronary CTA interpretation by the cardiologist is attached. COMPARISON:  chest CT 10/20/2015 FINDINGS: Heart is borderline in size.  Visualized aorta normal caliber. No adenopathy in the lower mediastinum or hila. Lungs are clear.  No effusions. Imaging into the upper abdomen shows no acute findings. Chest wall soft tissues are unremarkable. No acute bony abnormality. IMPRESSION: No acute or significant extracardiac abnormality. Electronically Signed   By: Charlett Nose M.D.   On: 05/02/2017 08:15   Result Date: 05/02/2017 CLINICAL DATA:  Chest pain EXAM: Cardiac CTA MEDICATIONS: Sub lingual nitro .4mg  and lopressor 5mg  IV TECHNIQUE: The patient was scanned on a Siemens 192 slice scanner.  Gantry rotation speed was 250 msecs. Collimation was .6 mm. A 100 kV prospective scan was triggered in the acending thoracic aorta in adaptive fashion at 65-75% of the R-R interval. Average HR during the scan was 65 bpm. The 3D data set was interpreted on a dedicated work station using  MPR, MIP and VRT modes. A total of 80cc of contrast was used. FINDINGS: Non-cardiac: See separate report from [facility]. Calcium Score:  0 Agatston units. Coronary Arteries: Right dominant with no anomalies LM:  Short, no significant disease. LAD system:  No plaque or stenosis. Circumflex system:  No plaque or stenosis. RCA system:  No plaque or stenosis. IMPRESSION: 1. Coronary artery calcium score of 0 Agatston units, suggesting low risk for future cardiac events. 2.  No significant coronary disease noted on CT angiography. [employer] account executive Electronically Signed: By: Marca Ancona M.D. On: 05/01/2017 17:04    (Echo, Carotid, EGD, Colonoscopy, ERCP)    Subjective:   Discharge Exam: Vitals:   05/01/17 2142 05/02/17 0545  BP: 110/67 (!) 108/54  Pulse: (!) 55 (!) 58  Resp: 16 18  Temp: 97.8 F (36.6 C) 97.9 F (36.6 C)  SpO2: 99% 100%   Vitals:   05/01/17 1428 05/01/17 1756 05/01/17 2142 05/02/17 0545  BP: 125/70 107/72 110/67 (!) 108/54  Pulse: 72 64 (!) 55 (!) 58  Resp: Temp: 99 F (37.2 C) 98.4 F (36.9 C) 97.8 F (36.6 C) 97.9 F (36.6 C)  TempSrc: Oral Oral Oral Oral  SpO2: 99% 99% 99% 100%  Weight:      Height:        General: Pt is alert, awake, not in acute distress Cardiovascular: RRR, S1/S2 +, no rubs, no gallops Respiratory: CTA bilaterally, no wheezing, no rhonchi Abdominal: Soft, NT, ND, bowel sounds + Extremities: no edema, no cyanosis    The results of significant diagnostics from this hospitalization (including imaging, microbiology, ancillary and laboratory) are listed below for reference.     Microbiology: No results found for this or any previous visit (from the past 240 hour(s)).   Labs: BNP (last 3 results) No results for input(s): BNP in the last 8760 hours. Basic Metabolic Panel:  Recent Labs Lab 04/30/17 1255 05/01/17 0528 05/02/17 0452  NA 141 140 139  K 3.1* 3.8 4.0  CL 106 107 107  CO2 GLUCOSE 84  93 89  BUN CREATININE 0.80 0.68 0.69  CALCIUM 9.3 8.9 9.1   Liver Function Tests:  Recent Labs Lab 04/30/17 1255 05/02/17 0452  AST 32 47*  ALT 28 34  ALKPHOS 95 89  BILITOT 0.7 0.6  PROT 7.1 6.2*  ALBUMIN 4.2 3.5   No results for input(s): LIPASE, AMYLASE in the last 168 hours. No results for input(s): AMMONIA in the last 168 hours. CBC:  Recent Labs Lab 04/30/17 1255 05/02/17 0452  WBC 8.3 4.5  NEUTROABS 5.8  --   HGB 11.2* 10.5*  HCT 34.1* 32.6*  MCV 85.3 88.3  PLT 266 235   Cardiac Enzymes:  Recent Labs Lab 04/30/17 1255 04/30/17 1729 04/30/17 2322  TROPONINI <0.03 <0.03 <0.03   BNP: Invalid input(s): POCBNP CBG: No results for input(s): GLUCAP in the last 168 hours. D-Dimer No results for input(s): DDIMER in the last 72 hours. Hgb A1c  Recent Labs  05/01/17 0528  HGBA1C 5.5   Lipid Profile  Recent Labs  05/01/17 0528  CHOL 222*  HDL 109  LDLCALC 103*  TRIG 51  CHOLHDL 2.0   Thyroid function studies No results for input(s): TSH, T4TOTAL, T3FREE, THYROIDAB in the last 72 hours.  Invalid input(s): FREET3 Anemia work up No results for input(s): VITAMINB12, FOLATE, FERRITIN, TIBC, IRON, RETICCTPCT in the last 72 hours. Urinalysis    Component Value Date/Time   BILIRUBINUR n 03/20/2012 1113   PROTEINUR n 03/20/2012 1113   UROBILINOGEN 0.2 03/20/2012 1113   NITRITE n 03/20/2012 1113   LEUKOCYTESUR Negative 03/20/2012 1113   Sepsis Labs Invalid input(s): PROCALCITONIN,  WBC,  LACTICIDVEN Microbiology No results found for this or any previous visit (from the past 240 hour(s)).   Time coordinating discharge: Over 30 minutes  SIGNED:   Erick Blinks, MD  [facility] 05/02/2017, 5:19 PM Pager   If 7PM-7AM, please contact night-coverage [email or web address] Password [email or web address]

## 2017-05-04 ENCOUNTER — Telehealth: Payer: Self-pay

## 2017-05-04 NOTE — Telephone Encounter (Signed)
Attempted to reach patient to complete TCM follow-up call. Unable to reach patient. Message left on voicemail asking patient to return call to office.   

## 2017-05-07 NOTE — Telephone Encounter (Signed)
Admit date: 04/30/2017 Discharge date: 05/02/2017  Admitted From: Home Disposition:  Home  Recommendations for Outpatient Follow-up:  1. Follow up with PCP in 1-2 weeks 2. Please obtain BMP/CBC in one week 3. Follow up with Cardiology 05/16/2017 for Holter monitoring 4. Follow up with Neurology for outpatient NCS/EMG in 2 weeks  Home Health:NO Equipment/Devices:None Discharge Condition:Stable CODE STATUS:Full Diet recommendation: Heart Healthy  Spoke with pt and she states that she is doing well overall. She does report some palpitations and racing heart rate today, highest was 113. She denies any exertion at the time. She is scheduled to see cardiology. She has no other questions or concerns at this time.  Appt scheduled with Dr. Durene Cal 05/09/17, pt aware.   Transition Care Management Follow-up Telephone Call   Date discharged? 05/02/17   How have you been since you were released from the hospital? Good, still some palpitations/racing heart rate   Do you understand why you were in the hospital? yes   Do you understand the discharge instructions? yes   Where were you discharged to? home   Items Reviewed:  Medications reviewed: yes  Allergies reviewed: yes  Dietary changes reviewed: yes  Referrals reviewed: yes   Functional Questionnaire:   Activities of Daily Living (ADLs):   She states they are independent in the following: ambulation, bathing and hygiene, feeding, continence, grooming, toileting and dressing States they require assistance with the following: none   Any transportation issues/concerns?: no   Any patient concerns? no   Confirmed importance and date/time of follow-up visits scheduled yes  Provider Appointment booked with Dr. Durene Cal 05/09/17  Confirmed with patient if condition begins to worsen call PCP or go to the ER.  Patient was given the office number and encouraged to call back with question or concerns.  : yes

## 2017-05-07 NOTE — Telephone Encounter (Signed)
Attempted to reach patient to complete TCM follow-up call. Unable to reach patient. Message left on voicemail asking patient to return call to office.   

## 2017-05-08 NOTE — Telephone Encounter (Signed)
noted thanks  

## 2017-05-09 ENCOUNTER — Encounter: Payer: Self-pay | Admitting: Family Medicine

## 2017-05-09 ENCOUNTER — Ambulatory Visit (INDEPENDENT_AMBULATORY_CARE_PROVIDER_SITE_OTHER): Payer: BLUE CROSS/BLUE SHIELD | Admitting: Family Medicine

## 2017-05-09 VITALS — BP 108/72 | HR 75 | Temp 98.3°F | Ht 66.0 in | Wt 121.6 lb

## 2017-05-09 DIAGNOSIS — E034 Atrophy of thyroid (acquired): Secondary | ICD-10-CM

## 2017-05-09 DIAGNOSIS — F419 Anxiety disorder, unspecified: Secondary | ICD-10-CM | POA: Diagnosis not present

## 2017-05-09 DIAGNOSIS — R55 Syncope and collapse: Secondary | ICD-10-CM

## 2017-05-09 DIAGNOSIS — I341 Nonrheumatic mitral (valve) prolapse: Secondary | ICD-10-CM | POA: Diagnosis not present

## 2017-05-09 DIAGNOSIS — R200 Anesthesia of skin: Secondary | ICD-10-CM | POA: Diagnosis not present

## 2017-05-09 NOTE — Patient Instructions (Signed)
Hospitalist/cardiology/neurology have done an excellent job with thorough workup. Glad you have great follow up with them as well.   No changes today. Let us know if you have new or recurrent symptoms.

## 2017-05-09 NOTE — Assessment & Plan Note (Signed)
Unclear cause. Extensive workup as noted today 05/09/17 in HPI. patient is aware of driving, showering recommendations per neurology. She will follow up with neurology as well as cardiology- with plan for holter monitor. She has had some palpitations since event so will be interesting to see if any abnormal/atypical rhythm at time of these.

## 2017-05-09 NOTE — Assessment & Plan Note (Signed)
Normal TSH on labs- continue on levothyroxine- likely not contributory to syncope

## 2017-05-09 NOTE — Assessment & Plan Note (Signed)
Noted again on most recent echo- unlikely contributory to syncopal event.

## 2017-05-09 NOTE — Progress Notes (Signed)
Subjective:  [name] Case is a 56 y.o. year old very pleasant female patient who presents for transitional care management and hospital follow up for syncope. Patient was hospitalized from 04/30/18  to 05/02/18. A TCM phone call was completed on 05/04/17 intially with several follow up attempts and finally able to reach patient on the 24th. Medical complexity : moderate  56 year old female with history of anxiety, hypothyroidism, otherwise rather healthy who presented with syncopal episode while sitting at her desk at work. Felt presyncopal- remembers trying to grab desk before LOC- woke up a few seconds later and was still holding the desk- thought was LOC for a few  Seconds. She went to stand up and was disoriented- fell to the right side. When she got up, felt confused and left arm was very numb/tingling from shoulder down. Confusion lasted a few minutes. No seizure activity noted- patient did pull left arm close to her chest. Patient also had an anxiety attack when attempting to transport to the hospital.   Patient in the ER had MR brain and cervical spine without acute findings. Also had reassuring carotid duplex, 2-d echocardiogram, CT coronary and EEG as well as EKG without any acute findings-first EGK with possible t wave inversions- follow up EKG within a day did not show that- movement on the first EKG. Echo did show mitral valve prolapse which has been noted previously.  Neurology and cardiology both consulted without any obvious cause. Was noted to have significant carpal tunnel syndrome which would not account for symptoms. Planned neurolgoy follow up. Potassium was low in the ER and repleted. Her hypothyroidism was well controlled with normal TSH.   From cardiology note  "Echo 05/01/17: Study Conclusions - Left ventricle: The cavity size was normal. Systolic function was normal. The estimated ejection fraction was in the range of 55% to 60%. Wall motion was normal; there were no regional  wall motion abnormalities. Left ventricular diastolic function parameters were normal. - Mitral valve: Mild, late systolic prolapse, involving the anterior leaflet.  CT coronary 05/01/17: 1. Coronary artery calcium score of 0 Agatston units, suggesting low risk for future cardiac events. 2. No significant coronary disease noted on CT angiography." On outpatient basis, planned holter monitor with cardiology. She had no events of syncope or events on the monitor while hospitalized. "  Of note, I personally reviewed MR brain wo contrast on 04/30/17 and no acute abnormalities noted. I also personally reviewed the MR cervical spine on 04/30/17 and noted minimal disc protrustion at c3-c4 but small protrusion at c4-c5. Otherwise spine unremarkable.   Since hospitalization, has had some fatigue, felt like heart rate has been higher/palpitations at times. Has checked pulse and in low 100s at times. No recurrence of syncope.no lightheadedness. No presyncope.    See problem oriented charting as well ROS- no fever, chills. Lightheadedness, syncope, presyncope, nausea, vomiting. Some numbness and tingling in left wrist.    Past Medical History-  Patient Active Problem List   Diagnosis Date Noted  . Syncope and collapse 05/01/2017    Priority: High  . Left arm numbness 04/30/2017    Priority: High  . Anxiety 10/10/2010    Priority: Medium  . Hypothyroidism 07/18/2007    Priority: Medium  . Dysphagia 02/24/2011    Priority: Low  . Mitral valve prolapse 07/18/2007    Priority: Low  . GERD 07/18/2007    Priority: Low    Medications- reviewed and updated  A medical reconciliation was performed comparing current  medicines to hospital discharge medications. Current Outpatient Prescriptions  Medication Sig Dispense Refill  . buPROPion (WELLBUTRIN XL) 300 MG 24 hr tablet Take 300 mg by mouth daily.    Marland Kitchen dexlansoprazole (DEXILANT) 60 MG capsule Take 1 capsule (60 mg total) by mouth daily.  90 capsule 1  . levothyroxine (SYNTHROID, LEVOTHROID) 150 MCG tablet TAKE 1 TABLET BY MOUTH DAILY 90 tablet 1  . zolpidem (AMBIEN) 10 MG tablet Take 10 mg by mouth at bedtime as needed.     No current facility-administered medications for this visit.     Objective: BP 108/72 (BP Location: Left Arm, Patient Position: Sitting, Cuff Size: Normal)   Pulse 75   Temp 98.3 F (36.8 C) (Oral)   Ht  (1.676 m)   Wt 121 lb 9.6 oz (55.2 kg)   LMP 11/28/2012   SpO2 99%   BMI 19.63 kg/m  Gen: NAD, resting comfortably CV: RRR no murmurs rubs or gallops Lungs: CTAB no crackles, wheeze, rhonchi Abdomen: soft/nontender/nondistended/normal bowel sounds. No rebound or guarding.  Ext: no edema Skin: warm, dry Neuro: CN II-XII intact, sensation and reflexes normal throughout, 5/5 muscle strength in bilateral upper and lower extremities. Normal finger to nose. Normal rapid alternating movements. No pronator drift. Normal romberg. Normal gait.   Assessment/Plan:   Mitral valve prolapse Noted again on most recent echo- unlikely contributory to syncopal event.   Hypothyroidism Normal TSH on labs- continue on levothyroxine- likely not contributory to syncope  Left arm numbness Due to carpal tunnel. She states symptoms were so bad with maneuvers in hospital that she declined me repeating them today. No symptoms reported just before syncopal event so unlikely related.   Syncope and collapse Unclear cause. Extensive workup as noted today 05/09/17 in HPI. patient is aware of driving, showering recommendations per neurology. She will follow up with neurology as well as cardiology- with plan for holter monitor. She has had some palpitations since event so will be interesting to see if any abnormal/atypical rhythm at time of these.   Anxiety Patient on wellbutrin  through psychiatry. We had a long discussion today as to if stress could have contributed to symptoms. She is concerned it has. Feels  overworked. 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 She may consider doing more part time work- wants to be able to cut down on 7 AM- 6:30 PM daily hours.  She also wants to consider going to  wellbutrin and I encouraged her to discuss with psychiatry as they are managing but did discuss wellbutrin at times can heighten anxiety- she has not tolerated SSRIs though. Also discussed possible buspirone usage.   Future Appointments Date Time Provider Department Center  05/16/2017 1:30 PM Manson Passey, [place] [facility]  05/16/2017 3:30 PM [facility]  05/21/2017 12:45 PM Gearldine Shown [facility] None  05/21/2017 1:30 PM York Spaniel, [place] [facility] None   Meds ordered this encounter  Medications  . zolpidem (AMBIEN) 10 MG tablet    Sig: Take 10 mg by mouth at bedtime as needed.  per psychiatry  Return precautions advised.  Tana Conch, MD

## 2017-05-09 NOTE — Assessment & Plan Note (Signed)
Due to carpal tunnel. She states symptoms were so bad with maneuvers in hospital that she declined me repeating them today. No symptoms reported just before syncopal event so unlikely related.

## 2017-05-09 NOTE — Assessment & Plan Note (Addendum)
Patient on wellbutrin  through psychiatry. We had a long discussion today as to if stress could have contributed to symptoms. She is concerned it has. Feels overworked. 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 She may consider doing more part time work- wants to be able to cut down on 7 AM- 6:30 PM daily hours.  She also wants to consider going to  wellbutrin and I encouraged her to discuss with psychiatry as they are managing but did discuss wellbutrin at times can heighten anxiety- she has not tolerated SSRIs though. Also discussed possible buspirone usage.

## 2017-05-14 NOTE — Progress Notes (Signed)
Cardiology Office Note    Date:  05/16/2017   ID:  [name] Case, [name] Case 17-Feb-1961, MRN [id number]  PCP:  Shelva Majestic, MD  Cardiologist: Dr. Elease Hashimoto  Chief Complaint: Hospital follow up for syncope  History of Present Illness:   [name] Case is a 56 y.o. female with history of hyperlipidemia and hypothyroidism presents for hospital follow up for syncope.   Admitted 04/30/17-05/02/17. She was unconscious for several seconds. This was followed by brief period of confusion. There was no seizure activity. There was no incontinence of bowel or bladder. No arrhthymias on telemetry. Echo shows normal LV function with mild MVP. EKG with diffuse TWI. Coronary CT was normal. Pending cardiac monitor.  Patient is here for follow-up. Feeling better in past one week. No further syncope episode however intermittently feels lightheaded and palpitation. Both occurs at different time.  No chest pain, shortness of breath, melena, orthopnea, PND or syncope. Patient had an episode of lightheadedness while driving to work this morning. She described has "ozzing". Symptoms resolved in a few seconds before she pulled over. No associated palpitation, shortness of breath, chest pain or headache. Patient states that she drinks 4 big cups of water.    Past Medical History:  Diagnosis Date  . Abnormal EKG   . Anemia   . Anxiety   . Disorder of sebaceous glands    sebaceous neoplasm  . Dysphagia 02/24/2011   Esophageal stretching x a few   . GERD 07/18/2007  . Heart murmur   . HYPOTHYROIDISM 07/18/2007  . Hypothyroidism 07/18/2007   Levothyroxine Lab Results  Component Value Date   TSH 2.39 10/14/2013       . Left arm numbness 04/30/2017  . MITRAL VALVE PROLAPSE 07/18/2007  . Mitral valve prolapse 07/18/2007   No dental prophylaxis   . 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 Pill esophagitis 2007  . Syncope and collapse 05/01/2017    Past Surgical History:  Procedure Laterality Date  .  APPENDECTOMY    . COLONOSCOPY  multiple   Normal  . UPPER GASTROINTESTINAL ENDOSCOPY  02/08/2009   inflammatory nodule in antrum, 50 Fr Maloney dilation  . UPPER GASTROINTESTINAL ENDOSCOPY  2007x2   pill esophagitis/ulcer  . UPPER GASTROINTESTINAL ENDOSCOPY  2004   erosive esophagitis and mild esophageal stricture - dilated    Current Medications: Prior to Admission medications   Medication Sig Start Date End Date Taking? Authorizing Provider  buPROPion (WELLBUTRIN XL) 300 MG 24 hr tablet Take 300 mg by mouth daily.    [provider]  dexlansoprazole (DEXILANT) 60 MG capsule Take 1 capsule (60 mg total) by mouth daily. 11/27/16   Iva Boop, MD  levothyroxine (SYNTHROID, LEVOTHROID) 150 MCG tablet TAKE 1 TABLET BY MOUTH DAILY 03/27/17   Shelva Majestic, MD  zolpidem (AMBIEN) 10 MG tablet Take 10 mg by mouth at bedtime as needed. 05/03/17   [provider]    Allergies:   Sulfamethoxazole and Tetracycline hcl   Social History   Social History  . Marital status: Married    Spouse name: N/A  . Number of children: 2  . 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 Smoking status: Never Smoker  . Smokeless tobacco: Never Used  . Alcohol use 1.8 oz/week    3 Glasses of wine per week  . Drug use: No  . Sexual activity: Not Asked   Other  Topics Concern  . None   Social History Narrative   Married. 2 children. No grandkids. English Bulldog and boston terrier. grandpet-rescue dog.       Research officer, political party for orthodontist      Hobbies: exercise, travel ([place] favorite spot)     Family History:  The patient's family history includes Hypertension in her brother and father.   ROS:   Please see the history of present illness.    ROS All other systems reviewed and are negative.   PHYSICAL EXAM:   VS:  BP 102/66   Pulse 66   Ht  (1.676 m)   Wt 122 lb (55.3 kg)   LMP  11/28/2012   SpO2 100%   BMI 19.69 kg/m    GEN: Well nourished, well developed, in no acute distress  HEENT: normal  Neck: no JVD, carotid bruits, or masses Cardiac: RRR; no murmurs, rubs, or gallops,no edema  Respiratory:  clear to auscultation bilaterally, normal work of breathing GI: soft, nontender, nondistended, + BS MS: no deformity or atrophy  Skin: warm and dry, no rash Neuro:  Alert and Oriented x 3, Strength and sensation are intact Psych: euthymic mood, full affect  Wt Readings from Last 3 Encounters:  05/16/17 122 lb (55.3 kg)  05/09/17 121 lb 9.6 oz (55.2 kg)  04/30/17 124 lb 5.4 oz (56.4 kg)      Studies/Labs Reviewed:   EKG:  EKG is not ordered today.    Recent Labs: 03/08/2017: TSH 2.64 05/02/2017: ALT 34; BUN 11; Creatinine, Ser 0.69; Hemoglobin 10.5; Platelets 235; Potassium 4.0; Sodium 139   Lipid Panel    Component Value Date/Time   CHOL 222 (H) 05/01/2017 0528   TRIG 51 05/01/2017 0528   HDL 109 05/01/2017 0528   CHOLHDL 2.0 05/01/2017 0528   VLDL 10 05/01/2017 0528   LDLCALC 103 (H) 05/01/2017 0528   LDLDIRECT 139.8 08/22/2013 0836    Additional studies/ records that were reviewed today include:   As above    ASSESSMENT & PLAN:    1. Syncope/Lightheadedness/palpitation - Echo shows normal LV function with mild MVP ( not the cause of her syncope ). Normal coronary CT. - No recurrent episode of syncope however patient has intermittent lightheadedness and palpitation. This lasts for a few seconds with self resolution. She had lightheadedness while driving to work this morning. No associated symptoms.  She lives in [place] and works at [employer].  - BP of 102/66 today. Not orthostatic by vitals. Drink plenty of water. Advise to check blood sugar during episode of lightheadedness. Pending monitor. Advise not to drive for 6 months per Law.   Orthostatic VS for the past 24 hrs:  BP- Lying Pulse- Lying BP- Sitting Pulse- Sitting BP- Standing at  0 minutes Pulse- Standing at 0 minutes  05/16/17 1405 124/80 64 125/79 69 119/81 71    2. Abnormal EKG - Normal coronary CT. No chest pain or shortness of breath.     Medication Adjustments/Labs and Tests Ordered: Current medicines are reviewed at length with the patient today.  Concerns regarding medicines are outlined above.  Medication changes, Labs and Tests ordered today are listed in the Patient Instructions below. Patient Instructions  Medication Instructions: Your physician recommends that you continue on your current medications as directed. Please refer to the Current Medication list given to you today.  Labwork: None Ordered  Procedures/Testing: None Ordered  Follow-Up: Your physician recommends that you schedule a follow-up appointment in: 6 WEEKS with  Dr. Elease Hashimoto or with an APP on Dr. Sallee Provencal team on a DAY THAT DR Elease Hashimoto is in the office    If you need a refill on your cardiac medications before your next appointment, please call your pharmacy.      Lorelei Pont, [place]  05/16/2017 2:11 PM    [facility] [address] [place], [place], [place]  [address] Phone: [phone]; Fax: [phone]

## 2017-05-16 ENCOUNTER — Other Ambulatory Visit: Payer: Self-pay | Admitting: Physician Assistant

## 2017-05-16 ENCOUNTER — Ambulatory Visit (INDEPENDENT_AMBULATORY_CARE_PROVIDER_SITE_OTHER): Payer: BLUE CROSS/BLUE SHIELD | Admitting: Physician Assistant

## 2017-05-16 ENCOUNTER — Encounter: Payer: Self-pay | Admitting: Physician Assistant

## 2017-05-16 ENCOUNTER — Ambulatory Visit (INDEPENDENT_AMBULATORY_CARE_PROVIDER_SITE_OTHER): Payer: BLUE CROSS/BLUE SHIELD

## 2017-05-16 VITALS — BP 102/66 | HR 66 | Ht 66.0 in | Wt 122.0 lb

## 2017-05-16 DIAGNOSIS — R002 Palpitations: Secondary | ICD-10-CM

## 2017-05-16 DIAGNOSIS — R55 Syncope and collapse: Secondary | ICD-10-CM

## 2017-05-16 DIAGNOSIS — R42 Dizziness and giddiness: Secondary | ICD-10-CM | POA: Diagnosis not present

## 2017-05-16 NOTE — Patient Instructions (Signed)
Medication Instructions: Your physician recommends that you continue on your current medications as directed. Please refer to the Current Medication list given to you today.  Labwork: None Ordered  Procedures/Testing: None Ordered  Follow-Up: Your physician recommends that you schedule a follow-up appointment in: 6 WEEKS with Dr. Elease Hashimoto or with an APP on Dr. Sallee Provencal team on a DAY THAT DR Elease Hashimoto is in the office    If you need a refill on your cardiac medications before your next appointment, please call your pharmacy.

## 2017-05-21 ENCOUNTER — Encounter: Payer: Self-pay | Admitting: Neurology

## 2017-05-31 ENCOUNTER — Other Ambulatory Visit: Payer: Self-pay | Admitting: Internal Medicine

## 2017-05-31 DIAGNOSIS — F331 Major depressive disorder, recurrent, moderate: Secondary | ICD-10-CM | POA: Diagnosis not present

## 2017-05-31 DIAGNOSIS — Z01419 Encounter for gynecological examination (general) (routine) without abnormal findings: Secondary | ICD-10-CM | POA: Diagnosis not present

## 2017-06-27 ENCOUNTER — Encounter (HOSPITAL_COMMUNITY): Payer: Self-pay

## 2017-06-27 ENCOUNTER — Other Ambulatory Visit: Payer: Self-pay

## 2017-06-27 ENCOUNTER — Telehealth: Payer: Self-pay | Admitting: Internal Medicine

## 2017-06-27 DIAGNOSIS — Z5321 Procedure and treatment not carried out due to patient leaving prior to being seen by health care provider: Secondary | ICD-10-CM | POA: Diagnosis not present

## 2017-06-27 DIAGNOSIS — R1013 Epigastric pain: Secondary | ICD-10-CM | POA: Insufficient documentation

## 2017-06-27 LAB — URINALYSIS, ROUTINE W REFLEX MICROSCOPIC
Bacteria, UA: NONE SEEN
Bilirubin Urine: NEGATIVE
Glucose, UA: NEGATIVE mg/dL
Ketones, ur: 5 mg/dL — AB
Leukocytes, UA: NEGATIVE
Nitrite: NEGATIVE
Protein, ur: NEGATIVE mg/dL
Specific Gravity, Urine: 1.013 (ref 1.005–1.030)
pH: 5 (ref 5.0–8.0)

## 2017-06-27 LAB — LIPASE, BLOOD: Lipase: 33 U/L (ref 11–51)

## 2017-06-27 LAB — COMPREHENSIVE METABOLIC PANEL
ALT: 44 U/L (ref 14–54)
AST: 47 U/L — ABNORMAL HIGH (ref 15–41)
Albumin: 3.8 g/dL (ref 3.5–5.0)
Alkaline Phosphatase: 110 U/L (ref 38–126)
Anion gap: 10 (ref 5–15)
BUN: 11 mg/dL (ref 6–20)
CO2: 23 mmol/L (ref 22–32)
Calcium: 9.4 mg/dL (ref 8.9–10.3)
Chloride: 103 mmol/L (ref 101–111)
Creatinine, Ser: 0.74 mg/dL (ref 0.44–1.00)
GFR calc Af Amer: >60 mL/min (ref >60)
GFR calc non Af Amer: >60 mL/min (ref >60)
Glucose, Bld: 85 mg/dL (ref 65–99)
Potassium: 3.7 mmol/L (ref 3.5–5.1)
Sodium: 136 mmol/L (ref 135–145)
Total Bilirubin: 0.6 mg/dL (ref 0.3–1.2)
Total Protein: 6.7 g/dL (ref 6.5–8.1)

## 2017-06-27 LAB — CBC
HCT: 34.5 % — ABNORMAL LOW (ref 36.0–46.0)
Hemoglobin: 11.1 g/dL — ABNORMAL LOW (ref 12.0–15.0)
MCH: 27.8 pg (ref 26.0–34.0)
MCHC: 32.2 g/dL (ref 30.0–36.0)
MCV: 86.3 fL (ref 78.0–100.0)
Platelets: 275 10*3/uL (ref 150–400)
RBC: 4 MIL/uL (ref 3.87–5.11)
RDW: 13.6 % (ref 11.5–15.5)
WBC: 5.6 10*3/uL (ref 4.0–10.5)

## 2017-06-27 MED ORDER — GI COCKTAIL ~~LOC~~
30.0000 mL | Freq: Once | ORAL | Status: AC
  Administered 2017-06-27: 30 mL via ORAL
  Filled 2017-06-27: qty 30

## 2017-06-27 NOTE — ED Notes (Signed)
No relief for patient with GI cocktail.

## 2017-06-27 NOTE — ED Notes (Signed)
Pt reports taking 12 tums, 3 gasex, 2 zantac, 60 mg pepto since 6 PM

## 2017-06-27 NOTE — ED Triage Notes (Signed)
PT reports tonight at 1800 she began experiencing sudden onset of severe epigastric pain. Denies n/v/d. Denies chest pain, diaphoresis, sob. Pt states she has GI hx and GI doctor instructed her to come here,

## 2017-06-27 NOTE — Telephone Encounter (Signed)
3+ hrs lower chest/epigastric pain - stabbing Is on Dexilant Antacids do not help Recent neg cardiac w/u  Advised she go to ED  ? Gallbladder, pancreas Ulcer unlikely on Dexilant

## 2017-06-28 ENCOUNTER — Emergency Department (HOSPITAL_COMMUNITY)
Admission: EM | Admit: 2017-06-28 | Discharge: 2017-06-28 | Disposition: A | Discharge status: Home / Self Care | Payer: BLUE CROSS/BLUE SHIELD | Attending: Emergency Medicine | Admitting: Emergency Medicine

## 2017-06-28 ENCOUNTER — Ambulatory Visit: Payer: BLUE CROSS/BLUE SHIELD | Admitting: Physician Assistant

## 2017-06-28 ENCOUNTER — Telehealth: Payer: Self-pay | Admitting: Internal Medicine

## 2017-06-28 DIAGNOSIS — H524 Presbyopia: Secondary | ICD-10-CM | POA: Diagnosis not present

## 2017-06-28 DIAGNOSIS — H35363 Drusen (degenerative) of macula, bilateral: Secondary | ICD-10-CM | POA: Diagnosis not present

## 2017-06-28 NOTE — Telephone Encounter (Signed)
Pt was advised to go to ER for abd pain last night by Dr. Leone PayorGessner. Pt went but did not stay due to the wait, husband states she was given a "cocktail" that helped some. Requesting pt be seen asap. Pt scheduled to see Willette ClusterPaula Guenther NP today at 3pm, Husband aware of appt.

## 2017-06-28 NOTE — ED Notes (Signed)
Pt up to nurse first stating she would like to leave and call her doctor. Encouraged pt to stay for eval and if conditions changes to return. Ambulatory through lobby doors with steady gait

## 2017-06-29 ENCOUNTER — Ambulatory Visit (INDEPENDENT_AMBULATORY_CARE_PROVIDER_SITE_OTHER): Payer: BLUE CROSS/BLUE SHIELD | Admitting: Internal Medicine

## 2017-06-29 ENCOUNTER — Telehealth: Payer: Self-pay

## 2017-06-29 ENCOUNTER — Encounter: Payer: Self-pay | Admitting: Internal Medicine

## 2017-06-29 VITALS — BP 104/74 | HR 67 | Ht 66.0 in | Wt 122.0 lb

## 2017-06-29 DIAGNOSIS — R1013 Epigastric pain: Secondary | ICD-10-CM | POA: Diagnosis not present

## 2017-06-29 DIAGNOSIS — R1310 Dysphagia, unspecified: Secondary | ICD-10-CM

## 2017-06-29 DIAGNOSIS — R1319 Other dysphagia: Secondary | ICD-10-CM

## 2017-06-29 DIAGNOSIS — K2100 Gastro-esophageal reflux disease with esophagitis, without bleeding: Secondary | ICD-10-CM

## 2017-06-29 MED ORDER — HYDROCODONE-ACETAMINOPHEN 5-325 MG PO TABS: 1.0000 | ORAL_TABLET | Freq: Four times a day (QID) | ORAL | 0 refills | Status: DC | PRN

## 2017-06-29 NOTE — Patient Instructions (Addendum)
If you are age 55 or older, your body mass index should be between 23-30. Your Body mass index is 19.69 kg/m. If this is out of the aforementioned range listed, please consider follow up with your Primary Care Provider.  If you are age 55 or younger, your body mass index should be between 19-25. Your Body mass index is 19.69 kg/m. If this is out of the aformentioned range listed, please consider follow up with your Primary Care Provider.   You have been scheduled for an endoscopy. Please follow written instructions given to you at your visit today. If you use inhalers (even only as needed), please bring them with you on the day of your procedure. Your physician has requested that you go to www.startemmi.com and enter the access code given to you at your visit today. This web site gives a general overview about your procedure. However, you should still follow specific instructions given to you by our office regarding your preparation for the procedure.  You have been [facility] of [facility] on 07-06-2017 at 9am. Please arrive 15 minutes prior to your appointment for registration. Make certain not to have anything to eat or drink 6 hours prior to your appointment. Should you need to reschedule your appointment, please contact radiology at [phone]. This test typically takes about 30 minutes to perform.  Chew slow and completely before swallowing. :)   Low-Fat Diet for Pancreatitis or Gallbladder Conditions A low-fat diet can be helpful if you have pancreatitis or a gallbladder condition. With these conditions, your pancreas and gallbladder have trouble digesting fats. A healthy eating plan with less fat will help rest your pancreas and gallbladder and reduce your symptoms. What do I need to know about this diet?  Eat a low-fat diet. ? Reduce your fat intake to less than 20-30% of your total daily calories. This is less than 50-60 g  of fat per day. ? Remember that you need some fat in your diet. Ask your dietician what your daily goal should be. ? Choose nonfat and low-fat healthy foods. Look for the words "nonfat," "low fat," or "fat free." ? As a guide, look on the label and choose foods with less than 3 g of fat per serving. Eat only one serving.  Avoid alcohol.  Do not smoke. If you need help quitting, talk with your health care provider.  Eat small frequent meals instead of three large heavy meals. What foods can I eat? Grains Include healthy grains and starches such as potatoes, wheat bread, fiber-rich cereal, and brown rice. Choose whole grain options whenever possible. In adults, whole grains should account for 45-65% of your daily calories. Fruits and Vegetables Eat plenty of fruits and vegetables. Fresh fruits and vegetables add fiber to your diet. Meats and Other Protein Sources Eat lean meat such as chicken and pork. Trim any fat off of meat before cooking it. Eggs, fish, and beans are other sources of protein. In adults, these foods should account for 10-35% of your daily calories. Dairy Choose low-fat milk and dairy options. Dairy includes fat and protein, as well as calcium. Fats and Oils Limit high-fat foods such as fried foods, sweets, baked goods, sugary drinks. Other Creamy sauces and condiments, such as mayonnaise, can add extra fat. Think about whether or not you need to use them, or use smaller amounts or low fat options. What foods are not recommended?  High fat foods, such as: ? Baked  goods. ? Ice cream. ? [place] toast. ? Sweet rolls. ? Pizza. ? Cheese bread. ? Foods covered with batter, butter, creamy sauces, or cheese. ? Fried foods. ? Sugary drinks and desserts.  Foods that cause gas or bloating This information is not intended to replace advice given to you by your health care provider. Make sure you discuss any questions you have with your health care provider. Document  Released: 08/05/2013 Document Revised: 01/06/2016 Document Reviewed: 07/14/2013 Elsevier Interactive Patient Education  2017 Elsevier Inc.   Thank you for choosing [facility]  Dr. Stan Headarl Gessner

## 2017-06-29 NOTE — Progress Notes (Signed)
[name] CorinMelody B Case 56 y.Case. [birth year] [id number]  Assessment & Plan:   Encounter Diagnoses  Name Primary?  . Abdominal pain, epigastric Yes  . Esophageal dysphagia   . Gastroesophageal reflux disease with esophagitis     Regarding her epigastric pain the clinical scenario suggests gallstones or biliary colic to me so we will order an abdominal ultrasound limited right upper quadrant to evaluate for gallstones.  Her dysphagia is returning she has had that in the past last time I evaluated and treated her was 2010 at which point a 3154 [place] Maloney dilator was passed with good relief.  She is on 60 mg Dexilant which is excellent acid suppression hopefully that is continuing to work.  Plan for an EGD and likely esophageal dilation.  It is interesting that she is having some episodes of strangling or dysphagia-like symptoms when she is not eating.  This makes me wonder about the possibility of esophageal dysmotility.  Consider additional workup pending EGD and any response to dilation.  The risks and benefits as well as alternatives of endoscopic procedure(s) have been discussed and reviewed. All questions answered. The patient agrees to proceed.  In the meantime I want her to avoid fatty or fried foods in case gallbladder is the problem, and she should avoid foods or food bolus sizes that cause dysphagia in her case  I appreciate the opportunity to care for this patient. CC: [name] Case, [name] O, MD   Subjective:   Chief Complaint: Epigastric pain and dysphagia  HPI [name] Case is here for follow-up, I spoke to her husband on Wednesday 2 days ago when she was having severe lower or mid abdominal pain as described to me on the phone.  She went to the emergency department where laboratory testing was relatively unremarkable except for trace hemoglobin and a urinalysis and mildly elevated transaminase.  Lipase was normal.  She had taken Pepto-Bismol antacids and Zantac at home without any relief a  GI cocktail did not help her in the ER as well.  She was feeling a little better or not any worse and elected to go home and she was able to finally get some rest and feel better to where her epigastric pain is just a soreness right now.  She said the pain she had Wednesday night was quite intense like labor pains and very miserable radiating into the back.  She has never had pain like that before.  In addition she has been having recurrent dysphagia to dry bread and some solid foods.  She is also had episodes of gagging or strangling when she is not eating.  No significant heartburn as long as she takes her Dexilant. Allergies  Allergen Reactions  . Sulfamethoxazole     REACTION: hives  . Tetracycline Hcl     REACTION: ulcers in throat   Current Meds  Medication Sig  . buPROPion (WELLBUTRIN XL) 300 MG 24 hr tablet Take 300 mg by mouth daily.  Marland Kitchen. DEXILANT 60 MG capsule TAKE 1 CAPSULE(60 MG) BY MOUTH DAILY  . levothyroxine (SYNTHROID, LEVOTHROID) 150 MCG tablet TAKE 1 TABLET BY MOUTH DAILY  . zolpidem (AMBIEN) 10 MG tablet Take 10 mg by mouth at bedtime as needed.   Past Medical History:  Diagnosis Date  . Abnormal EKG   . Anemia   . Anxiety   . Disorder of sebaceous glands    sebaceous neoplasm  . Dysphagia 02/24/2011   Esophageal stretching x a few   . GERD 07/18/2007  . Heart  murmur   . HYPOTHYROIDISM 07/18/2007  . Hypothyroidism 07/18/2007   Levothyroxine 150mcg Lab Results  Component Value Date   TSH 2.39 10/14/2013       . Left arm numbness 04/30/2017  . MITRAL VALVE PROLAPSE 07/18/2007  . Mitral valve prolapse 07/18/2007   No dental prophylaxis   . 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 Pill esophagitis 2007  . Syncope and collapse 05/01/2017   Past Surgical History:  Procedure Laterality Date  . APPENDECTOMY    . COLONOSCOPY  multiple   Normal  . UPPER GASTROINTESTINAL ENDOSCOPY  02/08/2009   inflammatory nodule in antrum, 50 Fr Maloney dilation  . UPPER  GASTROINTESTINAL ENDOSCOPY  2007x2   pill esophagitis/ulcer  . UPPER GASTROINTESTINAL ENDOSCOPY  2004   erosive esophagitis and mild esophageal stricture - dilated   Social History   Social History Narrative   Married. 2 children. No grandkids. English Bulldog and boston terrier. grandpet-rescue dog.       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   Review of Systems As above also has been admitted to the hospital briefly and had cardiac evaluation for syncope that is so far negative including stress testing and Holter monitor   Objective:   Physical Exam BP 104/74   Pulse 67   Ht 5\' 6"  (1.676 m)   Wt 122 lb (55.3 kg)   LMP 11/28/2012   BMI 19.69 kg/m   @BP  104/74   Pulse 67   Ht 5\' 6"  (1.676 m)   Wt 122 lb (55.3 kg)   LMP 11/28/2012   BMI 19.69 kg/m @  General:  NAD Eyes:   anicteric Lungs:  clear Heart::  S1S2 no rubs, murmurs or gallops Abdomen:  soft and mildly tender epigastrium, BS+ Ext:   no edema, cyanosis or clubbing    Data Reviewed:  Emergency department visit and labs 06/27/2017

## 2017-06-29 NOTE — Telephone Encounter (Signed)
-----   Message from Iva Booparl E Gessner, MD sent at 06/28/2017  6:06 PM EST ----- Regarding: needs apt See if we can see her sometime tomorrow please

## 2017-06-29 NOTE — Telephone Encounter (Signed)
Patient called back and we will see her this afternoon.

## 2017-06-29 NOTE — Telephone Encounter (Signed)
Left messages on both cell and home # to call me back so we can work her in today to see Dr Leone PayorGessner.

## 2017-07-02 ENCOUNTER — Encounter: Payer: Self-pay | Admitting: Nurse Practitioner

## 2017-07-02 ENCOUNTER — Encounter: Payer: Self-pay | Admitting: Neurology

## 2017-07-02 ENCOUNTER — Other Ambulatory Visit: Payer: Self-pay | Admitting: *Deleted

## 2017-07-02 ENCOUNTER — Ambulatory Visit: Payer: BLUE CROSS/BLUE SHIELD | Admitting: Physician Assistant

## 2017-07-02 ENCOUNTER — Ambulatory Visit (INDEPENDENT_AMBULATORY_CARE_PROVIDER_SITE_OTHER): Payer: BLUE CROSS/BLUE SHIELD | Admitting: Nurse Practitioner

## 2017-07-02 VITALS — BP 120/80 | HR 69 | Ht 66.0 in | Wt 123.4 lb

## 2017-07-02 DIAGNOSIS — R55 Syncope and collapse: Secondary | ICD-10-CM

## 2017-07-02 DIAGNOSIS — I739 Peripheral vascular disease, unspecified: Principal | ICD-10-CM

## 2017-07-02 DIAGNOSIS — I779 Disorder of arteries and arterioles, unspecified: Secondary | ICD-10-CM

## 2017-07-02 NOTE — Patient Instructions (Signed)
We will be checking the following labs today - NONE   Medication Instructions:    Continue with your current medicines.     Testing/Procedures To Be Arranged:  Carotid doppler in September of 2019  Follow-Up:   See us back as needed.     Other Special Instructions:   N/A    If you need a refill on your cardiac medications before your next appointment, please call your pharmacy.   Call the [facility] office at [phone] if you have any questions, problems or concerns.

## 2017-07-02 NOTE — Progress Notes (Signed)
CARDIOLOGY OFFICE NOTE  Date:  07/02/2017    [name] Case Date of Birth: 11/14/1960 Medical Record #[id number]  PCP:  [name] Majestic, MD  Cardiologist:  [name] Case   Chief Complaint  Patient presents with  . Loss of Consciousness    Follow up visit - seen for Dr. Elease Case    History of Present Illness: [name] Case is a 56 y.o. female who presents today for a follow up visit. Seen for Dr. Elease Case.   She has a history of hyperlipidemia and hypothyroidism.   Seen last month for post hospital visit. She had been admitted in September with syncope - followed by brief period of confusion. No seizure activity.  No incontinence.  Echo showed normal LV function with mild MVP. EKG with diffuse TWI. Coronary CT was normal. EEG was normal. Unclear as to the etiology. Not to drive for 6 months.   When seen last month - noted she was feeling better - no further syncope but some lightheadedness and palpitations.   In the ER last week with GI complaints - did not wait to be seen - subsequently seen by GI as outpatient last Friday.   Comes in today. Here alone. Notes that she has done well since last visit here. Did have this abdominal pain last week - went to the ER - got GI cocktail - no relief - then went home without actually being seen.  She will be having an abdominal US later this week. She was given a narcotic to have on hand. Belly now tender. No more syncope. Her monitor was ok. She is not dizzy or lightheaded. No chest pain.   Past Medical History:  Diagnosis Date  . Abnormal EKG   . Anemia   . Anxiety   . Disorder of sebaceous glands    sebaceous neoplasm  . Dysphagia 02/24/2011   Esophageal stretching x a few   . GERD 07/18/2007  . Heart murmur   . HYPOTHYROIDISM 07/18/2007  . Hypothyroidism 07/18/2007   Levothyroxine Lab Results  Component Value Date   TSH 2.39 10/14/2013       . Left arm numbness 04/30/2017  . MITRAL VALVE PROLAPSE 07/18/2007  . Mitral valve  prolapse 07/18/2007   No dental prophylaxis   . 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 Pill esophagitis 2007  . Syncope and collapse 05/01/2017    Past Surgical History:  Procedure Laterality Date  . APPENDECTOMY    . COLONOSCOPY  multiple   Normal  . UPPER GASTROINTESTINAL ENDOSCOPY  02/08/2009   inflammatory nodule in antrum, 50 Fr Maloney dilation  . UPPER GASTROINTESTINAL ENDOSCOPY  2007x2   pill esophagitis/ulcer  . UPPER GASTROINTESTINAL ENDOSCOPY  2004   erosive esophagitis and mild esophageal stricture - dilated     Medications: Current Meds  Medication Sig  . buPROPion (WELLBUTRIN XL) 300 MG 24 hr tablet Take 300 mg by mouth daily.  Marland Kitchen DEXILANT 60 MG capsule TAKE 1 CAPSULE(60 MG) BY MOUTH DAILY  . HYDROcodone-acetaminophen (NORCO/VICODIN) 5-325 MG tablet Take 1 tablet every 6 (six) hours as needed by mouth for moderate pain.  Marland Kitchen levothyroxine (SYNTHROID, LEVOTHROID) 150 MCG tablet TAKE 1 TABLET BY MOUTH DAILY  . zolpidem (AMBIEN) 10 MG tablet Take 10 mg by mouth at bedtime as needed.     Allergies: Allergies  Allergen Reactions  . Sulfamethoxazole     REACTION: hives  . Tetracycline Hcl     REACTION: ulcers in throat  Social History: The patient  reports that  has never smoked. she has never used smokeless tobacco. She reports that she drinks about 1.8 oz of alcohol per week. She reports that she does not use drugs.   Family History: The patient's family history includes Hypertension in her brother and father.   Review of Systems: Please see the history of present illness.   Otherwise, the review of systems is positive for none.   All other systems are reviewed and negative.   Physical Exam: VS:  BP 120/80 (BP Location: Left Arm, Patient Position: Sitting, Cuff Size: Normal)   Pulse 69   Ht 5\' 6"  (1.676 m)   Wt 123 lb 6.4 oz (56 kg)   LMP 11/28/2012   SpO2 98%   BMI 19.92 kg/m  .  BMI Body mass index is 19.92 kg/m.  Wt Readings from Last 3  Encounters:  07/02/17 123 lb 6.4 oz (56 kg)  06/29/17 122 lb (55.3 kg)  05/16/17 122 lb (55.3 kg)    General: Pleasant. Very well dressed. Alert and in no acute distress.   HEENT: Normal.  Neck: Supple, no JVD, carotid bruits, or masses noted.  Cardiac: Regular rate and rhythm. No murmurs, rubs, or gallops. No edema.  Respiratory:  Lungs are clear to auscultation bilaterally with normal work of breathing.  GI: Soft and nontender.  MS: No deformity or atrophy. Gait and ROM intact.  Skin: Warm and dry. Color is normal.  Neuro:  Strength and sensation are intact and no gross focal deficits noted.  Psych: Alert, appropriate and with normal affect.   LABORATORY DATA:  EKG:  EKG is not ordered today.  Lab Results  Component Value Date   WBC 5.6 06/27/2017   HGB 11.1 (L) 06/27/2017   HCT 34.5 (L) 06/27/2017   PLT 275 06/27/2017   GLUCOSE 85 06/27/2017   CHOL 222 (H) 05/01/2017   TRIG 51 05/01/2017   HDL 109 05/01/2017   LDLDIRECT 139.8 08/22/2013   LDLCALC 103 (H) 05/01/2017   ALT 44 06/27/2017   AST 47 (H) 06/27/2017   NA 136 06/27/2017   K 3.7 06/27/2017   CL 103 06/27/2017   CREATININE 0.74 06/27/2017   BUN 11 06/27/2017   CO2 23 06/27/2017   TSH 2.64 03/08/2017   INR 1.0 ratio 06/17/2009   HGBA1C 5.5 05/01/2017     BNP (last 3 results) No results for input(s): BNP in the last 8760 hours.  ProBNP (last 3 results) No results for input(s): PROBNP in the last 8760 hours.   Other Studies Reviewed Today:   Event Monitor Study Highlights 05/2017   Sinus rhythm . Including sinus brady and sinus tachycardia       Echo Study Conclusions 04/2017  - Left ventricle: The cavity size was normal. Systolic function was   normal. The estimated ejection fraction was in the range of 55%   to 60%. Wall motion was normal; there were no regional wall   motion abnormalities. Left ventricular diastolic function   parameters were normal. - Mitral valve: Mild, late  systolicprolapse, involving the anterior   leaflet.   Carotid Doppler Summary 04/2017:  - Right - No evidence of extracranial ICA stenosis noted. Vertebral artery flow is antegrade. - Left - There is an elevation of velocities in the mid ICA consistent with a 40% to 59% ICA stenosis lower end of range. However plaque morphology nor narrowing support the increase. May possible be due to mild tortuosity at the site. Vertebral  artery flow is antegrade.  Other specific details can be found in the table(s) above. Prepared and Electronically Authenticated by  Charlena CrossWells Brabham IV, MD 2018-09-19T16:28:35    Assessment/Plan: 1. Syncope - negative evaluation. Her event monitor was ok - nothing worrisome. Unclear etiology of syncope. Was advised during her admission about no driving for 6 months. If her syncope recurs, would place loop recorder.   2. Carotid disease -  Will need updating in September of 2019.   3. Mild MVP - would follow.   4. Hypothyroidism - on replacement.   5. Abdominal pain - work up in process for possible gallbladder disease.   Current medicines are reviewed with the patient today.  The patient does not have concerns regarding medicines other than what has been noted above.  The following changes have been made:  See above.  Labs/ tests ordered today include:   No orders of the defined types were placed in this encounter.    Disposition:   FU with us as needed. Discussed with Dr. Elease HashimotoNahser here in the office this morning who is in agreement.     Patient is agreeable to this plan and will call if any problems develop in the interim.   SignedNorma Fredrickson: Dennies Coate, NP  07/02/2017 11:29 AM  [facility] [address] [place], [place]  [address] Phone: [phone] Fax: [phone]

## 2017-07-03 ENCOUNTER — Ambulatory Visit: Payer: BLUE CROSS/BLUE SHIELD | Admitting: Nurse Practitioner

## 2017-07-06 ENCOUNTER — Ambulatory Visit (HOSPITAL_COMMUNITY)
Admission: RE | Admit: 2017-07-06 | Discharge: 2017-07-06 | Disposition: A | Discharge status: Home / Self Care | Payer: BLUE CROSS/BLUE SHIELD | Source: Ambulatory Visit | Attending: Internal Medicine | Admitting: Internal Medicine

## 2017-07-06 DIAGNOSIS — K2100 Gastro-esophageal reflux disease with esophagitis, without bleeding: Secondary | ICD-10-CM

## 2017-07-06 DIAGNOSIS — R1319 Other dysphagia: Secondary | ICD-10-CM

## 2017-07-06 DIAGNOSIS — R1310 Dysphagia, unspecified: Secondary | ICD-10-CM

## 2017-07-06 DIAGNOSIS — R109 Unspecified abdominal pain: Secondary | ICD-10-CM | POA: Diagnosis not present

## 2017-07-06 DIAGNOSIS — R1013 Epigastric pain: Secondary | ICD-10-CM | POA: Diagnosis not present

## 2017-07-10 NOTE — Progress Notes (Signed)
My chart message to patient that there were no gallstones. EGD planned for January.  Continue to monitor.

## 2017-07-25 DIAGNOSIS — F331 Major depressive disorder, recurrent, moderate: Secondary | ICD-10-CM | POA: Diagnosis not present

## 2017-08-23 ENCOUNTER — Other Ambulatory Visit: Payer: Self-pay | Admitting: Internal Medicine

## 2017-08-24 ENCOUNTER — Ambulatory Visit (AMBULATORY_SURGERY_CENTER): Payer: BLUE CROSS/BLUE SHIELD | Admitting: Internal Medicine

## 2017-08-24 ENCOUNTER — Other Ambulatory Visit: Payer: Self-pay

## 2017-08-24 ENCOUNTER — Encounter: Payer: Self-pay | Admitting: Internal Medicine

## 2017-08-24 VITALS — BP 123/54 | HR 65 | Temp 98.4°F | Resp 17 | Ht 66.0 in | Wt 122.0 lb

## 2017-08-24 DIAGNOSIS — R1013 Epigastric pain: Secondary | ICD-10-CM | POA: Diagnosis not present

## 2017-08-24 DIAGNOSIS — K319 Disease of stomach and duodenum, unspecified: Secondary | ICD-10-CM | POA: Diagnosis not present

## 2017-08-24 DIAGNOSIS — R1319 Other dysphagia: Secondary | ICD-10-CM

## 2017-08-24 DIAGNOSIS — R1310 Dysphagia, unspecified: Secondary | ICD-10-CM | POA: Diagnosis not present

## 2017-08-24 DIAGNOSIS — K2950 Unspecified chronic gastritis without bleeding: Secondary | ICD-10-CM | POA: Diagnosis not present

## 2017-08-24 MED ORDER — SODIUM CHLORIDE 0.9 % IV SOLN: 500.0000 mL | INTRAVENOUS | Status: DC

## 2017-08-24 NOTE — Progress Notes (Signed)
Patient consents to having PA student observe procedure.

## 2017-08-24 NOTE — Progress Notes (Signed)
Called to room to assist during endoscopic procedure.  Patient ID and intended procedure confirmed with present staff. Received instructions for my participation in the procedure from the performing physician.  

## 2017-08-24 NOTE — Patient Instructions (Addendum)
I dilated the esophagus - it looked ok. I also took stomach biopsies - it might be inflamed.  I will contact you with results and plans.  I appreciate the opportunity to care for you. Iva Booparl E. Alexio Sroka, MD, [facility]  *Handouts given on stricture and dilation diet* CLEAR LIQUIDS FOR ONE HOUR AFTER DISCHARGE HOME, THEN SOFT FOODS THE REST OF TODAY  YOU HAD AN ENDOSCOPIC PROCEDURE TODAY AT THE [facility]:   Refer to the procedure report that was given to you for any specific questions about what was found during the examination.  If the procedure report does not answer your questions, please call your gastroenterologist to clarify.  If you requested that your care partner not be given the details of your procedure findings, then the procedure report has been included in a sealed envelope for you to review at your convenience later.  YOU SHOULD EXPECT: Some feelings of bloating in the abdomen. Passage of more gas than usual.  Walking can help get rid of the air that was put into your GI tract during the procedure and reduce the bloating. If you had a lower endoscopy (such as a colonoscopy or flexible sigmoidoscopy) you may notice spotting of blood in your stool or on the toilet paper. If you underwent a bowel prep for your procedure, you may not have a normal bowel movement for a few days.  Please Note:  You might notice some irritation and congestion in your nose or some drainage.  This is from the oxygen used during your procedure.  There is no need for concern and it should clear up in a day or so.  SYMPTOMS TO REPORT IMMEDIATELY:     Following upper endoscopy (EGD)  Vomiting of blood or coffee ground material  New chest pain or pain under the shoulder blades  Painful or persistently difficult swallowing  New shortness of breath  Fever of 100F or higher  Black, tarry-looking stools  For urgent or emergent issues, a gastroenterologist can be reached at any hour by  calling [phone].   DIET:  We do recommend a small meal at first, but then you may proceed to your regular diet.  Drink plenty of fluids but you should avoid alcoholic beverages for 24 hours.  ACTIVITY:  You should plan to take it easy for the rest of today and you should NOT DRIVE or use heavy machinery until tomorrow (because of the sedation medicines used during the test).    FOLLOW UP: Our staff will call the number listed on your records the next business day following your procedure to check on you and address any questions or concerns that you may have regarding the information given to you following your procedure. If we do not reach you, we will leave a message.  However, if you are feeling well and you are not experiencing any problems, there is no need to return our call.  We will assume that you have returned to your regular daily activities without incident.  If any biopsies were taken you will be contacted by phone or by letter within the next 1-3 weeks.  Please call us at [phone] if you have not heard about the biopsies in 3 weeks.    SIGNATURES/CONFIDENTIALITY: You and/or your care partner have signed paperwork which will be entered into your electronic medical record.  These signatures attest to the fact that that the information above on your After Visit Summary has been reviewed and is understood.  Full responsibility of the confidentiality of this discharge information lies with you and/or your care-partner.

## 2017-08-24 NOTE — Progress Notes (Signed)
To recovery, report to RN, VSS. 

## 2017-08-24 NOTE — Op Note (Signed)
[facility] Patient Name: [name] DixonMelody Case Procedure Date: 08/24/2017 10:21 AM MRN: [id number] Endoscopist: Iva Booparl E Filimon Miranda , MD Age: [age] Referring MD:  Date of Birth: 1961/08/11 Gender: Female Account #: [id number] Procedure:                Upper GI endoscopy Indications:              Epigastric abdominal pain, Dysphagia, Heartburn Medicines:                Propofol per Anesthesia, Monitored Anesthesia Care Procedure:                Pre-Anesthesia Assessment:                           - Prior to the procedure, a History and Physical                            was performed, and patient medications and                            allergies were reviewed. The patient's tolerance of                            previous anesthesia was also reviewed. The risks                            and benefits of the procedure and the sedation                            options and risks were discussed with the patient.                            All questions were answered, and informed consent                            was obtained. Prior Anticoagulants: The patient has                            taken no previous anticoagulant or antiplatelet                            agents. ASA Grade Assessment: II - A patient with                            mild systemic disease. After reviewing the risks                            and benefits, the patient was deemed in                            satisfactory condition to undergo the procedure.                           After obtaining informed consent, the endoscope was  passed under direct vision. Throughout the                            procedure, the patient's blood pressure, pulse, and                            oxygen saturations were monitored continuously. The                            Endoscope was introduced through the mouth, and                            advanced to the second part of duodenum. The upper                     GI endoscopy was accomplished without difficulty.                            The patient tolerated the procedure well. Scope In: Scope Out: Findings:                 No endoscopic abnormality was evident in the                            esophagus to explain the patient's complaint of                            dysphagia. It was decided, however, to proceed with                            dilation of the entire esophagus. The scope was                            withdrawn. Dilation was performed with a Maloney                            dilator with mild resistance at 54 Fr. The dilation                            site was examined following endoscope reinsertion                            and showed no change. Estimated blood loss: none.                           Diffuse mildly erythematous mucosa without bleeding                            was found in the gastric body and in the gastric                            antrum. Biopsies were taken with a cold forceps for  histology. Verification of patient identification                            for the specimen was done. Estimated blood loss was                            minimal.                           The cardia and gastric fundus were normal on                            retroflexion.                           The exam was otherwise without abnormality. Complications:            No immediate complications. Estimated Blood Loss:     Estimated blood loss was minimal. Impression:               - No endoscopic esophageal abnormality to explain                            patient's dysphagia. Esophagus dilated. Dilated.                           - Erythematous mucosa in the gastric body and                            antrum. Biopsied.                           - The examination was otherwise normal. Recommendation:           - Patient has a contact number available for                             emergencies. The signs and symptoms of potential                            delayed complications were discussed with the                            patient. Return to normal activities tomorrow.                            Written discharge instructions were provided to the                            patient.                           - Clear liquids x 1 hour then soft foods rest of                            day. Start prior diet tomorrow.                           -  Continue present medications.                           - Await pathology results.                           - See whaty dilation did and if gastric biopsies                            tell us anything. If persistent heartburn/reflux                            sxs med change vs pH/impedance on meds Iva Boop, MD 08/24/2017 10:42:15 AM This report has been signed electronically.

## 2017-08-27 ENCOUNTER — Telehealth: Payer: Self-pay | Admitting: *Deleted

## 2017-08-27 ENCOUNTER — Telehealth: Payer: Self-pay

## 2017-08-27 NOTE — Telephone Encounter (Signed)
  Follow up Call-  Call back number 08/24/2017  Post procedure Call Back phone  # [phone]  Permission to leave phone message Yes  Some recent data might be hidden     Patient questions:  Message left to call us if necessary.

## 2017-08-27 NOTE — Telephone Encounter (Signed)
  Follow up Call-  Call back number 08/24/2017  Post procedure Call Back phone  # [phone]  Permission to leave phone message Yes  Some recent data might be hidden     Patient questions:  Do you have a fever, pain , or abdominal swelling? No. Pain Score  0 *  Have you tolerated food without any problems? Yes.    Have you been able to return to your normal activities? Yes.    Do you have any questions about your discharge instructions: Diet   No. Medications  No. Follow up visit  No.  Do you have questions or concerns about your Care? No.  Actions: * If pain score is 4 or above: No action needed, pain <4.

## 2017-08-28 NOTE — Progress Notes (Signed)
[facility]  Please call her from office and tell her stomach inflammation - no infection  Ask her for a symptom update re: dysphagia, heartburn amd abdominal pain and I will then make recommendations

## 2017-08-29 NOTE — Progress Notes (Signed)
OK  I am recommending she take carafate 1 g tid AC # 90 w/ 2 refills  Take it 1 month straight and then use as needed  If this fails to adequately help her she should contact us

## 2017-08-30 ENCOUNTER — Other Ambulatory Visit: Payer: Self-pay

## 2017-08-30 MED ORDER — SUCRALFATE 1 G PO TABS: 1.0000 g | ORAL_TABLET | Freq: Three times a day (TID) | ORAL | 2 refills | Status: DC

## 2017-09-06 DIAGNOSIS — F331 Major depressive disorder, recurrent, moderate: Secondary | ICD-10-CM | POA: Diagnosis not present

## 2017-09-22 ENCOUNTER — Other Ambulatory Visit: Payer: Self-pay | Admitting: Family Medicine

## 2017-10-17 DIAGNOSIS — F331 Major depressive disorder, recurrent, moderate: Secondary | ICD-10-CM | POA: Diagnosis not present

## 2017-10-17 DIAGNOSIS — F411 Generalized anxiety disorder: Secondary | ICD-10-CM | POA: Diagnosis not present

## 2017-11-22 ENCOUNTER — Other Ambulatory Visit: Payer: Self-pay

## 2017-11-22 MED ORDER — DEXLANSOPRAZOLE 60 MG PO CPDR: DELAYED_RELEASE_CAPSULE | ORAL | 3 refills | Status: DC

## 2017-11-22 NOTE — Telephone Encounter (Signed)
Dexilant refilled as requested by the pharmacy.

## 2018-01-03 DIAGNOSIS — F411 Generalized anxiety disorder: Secondary | ICD-10-CM | POA: Diagnosis not present

## 2018-01-03 DIAGNOSIS — F331 Major depressive disorder, recurrent, moderate: Secondary | ICD-10-CM | POA: Diagnosis not present

## 2018-03-06 DIAGNOSIS — F331 Major depressive disorder, recurrent, moderate: Secondary | ICD-10-CM | POA: Diagnosis not present

## 2018-03-06 DIAGNOSIS — F411 Generalized anxiety disorder: Secondary | ICD-10-CM | POA: Diagnosis not present

## 2018-04-11 DIAGNOSIS — F411 Generalized anxiety disorder: Secondary | ICD-10-CM | POA: Diagnosis not present

## 2018-04-11 DIAGNOSIS — F331 Major depressive disorder, recurrent, moderate: Secondary | ICD-10-CM | POA: Diagnosis not present

## 2018-05-03 ENCOUNTER — Ambulatory Visit (HOSPITAL_COMMUNITY)
Admission: RE | Admit: 2018-05-03 | Discharge: 2018-05-03 | Disposition: A | Discharge status: Home / Self Care | Payer: BLUE CROSS/BLUE SHIELD | Source: Ambulatory Visit | Attending: Internal Medicine | Admitting: Internal Medicine

## 2018-05-03 DIAGNOSIS — I739 Peripheral vascular disease, unspecified: Secondary | ICD-10-CM

## 2018-05-03 DIAGNOSIS — I779 Disorder of arteries and arterioles, unspecified: Secondary | ICD-10-CM | POA: Insufficient documentation

## 2018-06-07 DIAGNOSIS — Z01419 Encounter for gynecological examination (general) (routine) without abnormal findings: Secondary | ICD-10-CM | POA: Diagnosis not present

## 2018-06-12 ENCOUNTER — Other Ambulatory Visit: Payer: Self-pay | Admitting: Family Medicine

## 2018-06-13 DIAGNOSIS — F331 Major depressive disorder, recurrent, moderate: Secondary | ICD-10-CM | POA: Diagnosis not present

## 2018-06-13 DIAGNOSIS — F411 Generalized anxiety disorder: Secondary | ICD-10-CM | POA: Diagnosis not present

## 2018-06-18 ENCOUNTER — Other Ambulatory Visit: Payer: Self-pay | Admitting: Family Medicine

## 2018-06-18 DIAGNOSIS — Z1231 Encounter for screening mammogram for malignant neoplasm of breast: Secondary | ICD-10-CM

## 2018-07-30 DIAGNOSIS — F331 Major depressive disorder, recurrent, moderate: Secondary | ICD-10-CM | POA: Diagnosis not present

## 2018-07-30 DIAGNOSIS — F411 Generalized anxiety disorder: Secondary | ICD-10-CM | POA: Diagnosis not present

## 2018-08-05 ENCOUNTER — Ambulatory Visit
Admission: RE | Admit: 2018-08-05 | Discharge: 2018-08-05 | Disposition: A | Discharge status: Home / Self Care | Payer: BLUE CROSS/BLUE SHIELD | Source: Ambulatory Visit | Attending: Family Medicine | Admitting: Family Medicine

## 2018-08-05 DIAGNOSIS — Z1231 Encounter for screening mammogram for malignant neoplasm of breast: Secondary | ICD-10-CM | POA: Diagnosis not present

## 2018-09-08 ENCOUNTER — Other Ambulatory Visit: Payer: Self-pay | Admitting: Family Medicine

## 2018-09-24 ENCOUNTER — Telehealth: Payer: Self-pay

## 2018-09-24 NOTE — Telephone Encounter (Signed)
I have submitted a CoverMyMeds form thru the computer for patients Dexilant for her GERD. This is a re-authorization. I will await the outcome.

## 2018-09-25 DIAGNOSIS — F411 Generalized anxiety disorder: Secondary | ICD-10-CM | POA: Diagnosis not present

## 2018-09-25 DIAGNOSIS — F331 Major depressive disorder, recurrent, moderate: Secondary | ICD-10-CM | POA: Diagnosis not present

## 2018-09-27 NOTE — Telephone Encounter (Signed)
The Dexilant has been approved and [employer] notified.

## 2018-10-09 ENCOUNTER — Telehealth: Payer: Self-pay | Admitting: Family Medicine

## 2018-10-09 NOTE — Telephone Encounter (Signed)
Left detailed message for pt to return phone call. Pt needs an ov.

## 2018-10-09 NOTE — Telephone Encounter (Signed)
Patient need to schedule an ov for more refills. 

## 2018-10-10 NOTE — Telephone Encounter (Signed)
Patient is scheduled for March 6th

## 2018-10-10 NOTE — Telephone Encounter (Signed)
[place] can you please schedule pt for medication f/u  Thank you!

## 2018-10-10 NOTE — Telephone Encounter (Signed)
Pt called to schedule appt. She is requesting something tomorrow or next Friday or the week of March 9th. Can be pt worked in? Please call to advise.

## 2018-10-18 ENCOUNTER — Encounter: Payer: Self-pay | Admitting: Family Medicine

## 2018-10-18 ENCOUNTER — Ambulatory Visit (INDEPENDENT_AMBULATORY_CARE_PROVIDER_SITE_OTHER): Payer: BLUE CROSS/BLUE SHIELD | Admitting: Family Medicine

## 2018-10-18 VITALS — BP 100/60 | HR 69 | Temp 97.6°F | Ht 66.0 in | Wt 122.0 lb

## 2018-10-18 DIAGNOSIS — Z0000 Encounter for general adult medical examination without abnormal findings: Secondary | ICD-10-CM | POA: Diagnosis not present

## 2018-10-18 DIAGNOSIS — Z1159 Encounter for screening for other viral diseases: Secondary | ICD-10-CM

## 2018-10-18 DIAGNOSIS — Z13220 Encounter for screening for lipoid disorders: Secondary | ICD-10-CM | POA: Diagnosis not present

## 2018-10-18 DIAGNOSIS — E039 Hypothyroidism, unspecified: Secondary | ICD-10-CM

## 2018-10-18 MED ORDER — LEVOTHYROXINE SODIUM 150 MCG PO TABS: 150.0000 ug | ORAL_TABLET | Freq: Every day | ORAL | 3 refills | Status: DC

## 2018-10-18 NOTE — Patient Instructions (Addendum)
Health Maintenance Due  Topic Date Due  . Hepatitis C Screening - will do with labs today [birth year]  . INFLUENZA VACCINE- already Done 03/14/2018   Sign release of information at the check out desk for last pap smear from GYN  Please stop by lab before you go If you do not have mychart- we will call you about results within 5 business days of [place] receiving them.  If you have mychart- we will send your results within 3 business days of [place] receiving them.  If abnormal or we want to clarify a result, we will call or mychart you to make sure you receive the message.  If you have questions or concerns or don't hear within 5-7 days, please send [place] a message or call us.

## 2018-10-18 NOTE — Progress Notes (Signed)
Phone: [phone]   Subjective:  Patient presents today for their annual physical. Chief complaint-noted.   See problem oriented charting- ROS- full  review of systems was completed and negative including No chest pain or shortness of breath. No headache or blurry vision. No unintentional weight changes.   The following were reviewed and entered/updated in epic: Past Medical History:  Diagnosis Date  . Abnormal EKG   . Allergy   . Anemia   . Anxiety   . Arthritis   . Blood transfusion without reported diagnosis   . Depression   . Disorder of sebaceous glands    sebaceous neoplasm  . Dysphagia 02/24/2011   Esophageal stretching x a few   . GERD 07/18/2007  . Heart murmur   . HYPOTHYROIDISM 07/18/2007  . Hypothyroidism 07/18/2007   Levothyroxine Lab Results  Component Value Date   TSH 2.39 10/14/2013       . Left arm numbness 04/30/2017  . MITRAL VALVE PROLAPSE 07/18/2007  . Mitral valve prolapse 07/18/2007   No dental prophylaxis   . 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 Pill esophagitis 2007  . Stroke (HCC)   . Syncope and collapse 05/01/2017  . Transient ischemic attack (TIA) 04/30/2017   Patient Active Problem List   Diagnosis Date Noted  . Syncope and collapse 05/01/2017    Priority: High  . Left arm numbness 04/30/2017    Priority: High  . Anxiety 10/10/2010    Priority: Medium  . Hypothyroidism 07/18/2007    Priority: Medium  . Dysphagia 02/24/2011    Priority: Low  . Mitral valve prolapse 07/18/2007    Priority: Low  . GERD 07/18/2007    Priority: Low   Past Surgical History:  Procedure Laterality Date  . APPENDECTOMY    . COLONOSCOPY  multiple   Normal  . UPPER GASTROINTESTINAL ENDOSCOPY  02/08/2009   inflammatory nodule in antrum, 50 Fr Maloney dilation  . UPPER GASTROINTESTINAL ENDOSCOPY  2007x2   pill esophagitis/ulcer  . UPPER GASTROINTESTINAL ENDOSCOPY  2004   erosive esophagitis and mild esophageal stricture - dilated     Family History  Problem Relation Age of Onset  . Hypertension Brother   . Hypertension Father   . Colon cancer Neg Hx   . Breast cancer Neg Hx   . Stomach cancer Neg Hx   . Esophageal cancer Neg Hx   . Rectal cancer Neg Hx     Medications- reviewed and updated Current Outpatient Medications  Medication Sig Dispense Refill  . buPROPion (WELLBUTRIN XL) 300 MG 24 hr tablet Take 300 mg by mouth daily.    Marland Kitchen dexlansoprazole (DEXILANT) 60 MG capsule TAKE 1 CAPSULE(60 MG) BY MOUTH DAILY 90 capsule 3  . levothyroxine (SYNTHROID, LEVOTHROID) 150 MCG tablet TAKE 1 TABLET BY MOUTH DAILY 30 tablet 0  . sucralfate (CARAFATE) 1 g tablet Take 1 tablet (1 g total) by mouth 3 (three) times daily before meals. 90 tablet 2  . zolpidem (AMBIEN) 10 MG tablet Take 10 mg by mouth at bedtime as needed.     No current facility-administered medications for this visit.     Allergies-reviewed and updated Allergies  Allergen Reactions  . Sulfamethoxazole     REACTION: hives  . Tetracycline Hcl     REACTION: ulcers in throat    Social History   Social History Narrative   Married. 2 children. No grandkids. English Bulldog and boston terrier. grandpet-rescue dog.       Research officer, political party for orthodontist  Hobbies: exercise, travel ([place] favorite spot)   Objective  Objective:  BP 100/60 (BP Location: Right Arm, Patient Position: Sitting, Cuff Size: Normal)   Pulse 69   Temp 97.6 F (36.4 C) (Oral)   Ht 5\' 6"  (1.676 m)   Wt 122 lb (55.3 kg)   LMP 11/28/2012   SpO2 99%   BMI 19.69 kg/m  Gen: NAD, resting comfortably HEENT: Mucous membranes are moist. Oropharynx normal Neck: no thyromegaly CV: RRR no murmurs rubs or gallops Lungs: CTAB no crackles, wheeze, rhonchi Abdomen: soft/nontender/nondistended/normal bowel sounds. No rebound or guarding.  Ext: no edema Skin: warm, dry Neuro: grossly normal, moves all extremities, PERRLA   Assessment and Plan    58  y.o. female presenting for annual physical.  Health Maintenance counseling: 1. Anticipatory guidance: Patient counseled regarding regular dental exams -q6 months, eye exams - yearly,  avoiding smoking and second hand smoke , limiting alcohol to 1 beverage per day .   2. Risk factor reduction:  Advised patient of need for regular exercise and diet rich and fruits and vegetables to reduce risk of heart attack and stroke. Exercise- pilates once a week, walks twice a week. Diet-reasonably healthy diet.  Wt Readings from Last 3 Encounters:  10/18/18 122 lb (55.3 kg)  08/24/17 122 lb (55.3 kg)  07/02/17 123 lb 6.4 oz (56 kg)  3. Immunizations/screenings/ancillary studies- wants with future visit Immunization History  Administered Date(s) Administered  . Influenza,inj,Quad PF,6+ Mos 05/01/2017  . Influenza-Unspecified 05/14/2013, 05/28/2014  . Tdap 03/17/2016  4. Cervical cancer screening- had with Dr. Charlotta Newton more recently than 04/2016 so we will get updated copy 5. Breast cancer screening-  breast exam with Dr. Charlotta Newton and mammogram 07/2018 6. Colon cancer screening - 12/2012 with 10 year follow up planned 7. Skin cancer screening- sees dr. Emily Filbert. advised regular sunscreen use. Denies worrisome, changing, or new skin lesions.  8. Birth control/STD check- postmenopausal and monogomous 9. Osteoporosis screening at 74- will plan on this at 58 10. never smoker  Status of chronic or acute concerns   #Hypothyroidism S: Compliant with levothyroxine 150 mcg. Has felt well with current dose A/P:  Likely Stable. Continue current medications. Update TSH    #Insomnia/anxiety/stress S:Sees psychiatry and is on wellbutrin 300mg  XL. Also on Ambien 10 mg nightly as needed through them.  A/P: Stable. Continue current medications. Continue psychiatyr follow up   #GERD S: follows with GI as well.  Compliant with Dexilant 60 mg. Only as needed Carafate. Last rx was through Dr. Leone Payor. Last endoscopy 08/24/17- was  having some dysphagia and after esophageal stretching - no issues A/P: doing well- continue current medicine- opted to check b12 with labs   #Mitral valve prolapse S: Noted on most recent echocardiogram in 05/01/2017  A/P: no symptoms- continue to monitor.       No problem-specific Assessment & Plan notes found for this encounter.   No future appointments. No follow-ups on file.  Lab/Order associations: NOT FASTING  No diagnosis found.  No orders of the defined types were placed in this encounter.   Return precautions advised.  Tana Conch, MD

## 2018-10-21 LAB — COMPREHENSIVE METABOLIC PANEL
AG Ratio: 1.7 (calc) (ref 1.0–2.5)
ALT: 24 U/L (ref 6–29)
AST: 26 U/L (ref 10–35)
Albumin: 4.3 g/dL (ref 3.6–5.1)
Alkaline phosphatase (APISO): 109 U/L (ref 37–153)
BUN: 16 mg/dL (ref 7–25)
CO2: 26 mmol/L (ref 20–32)
Calcium: 9.8 mg/dL (ref 8.6–10.4)
Chloride: 105 mmol/L (ref 98–110)
Creat: 0.83 mg/dL (ref 0.50–1.05)
Globulin: 2.6 g/dL (calc) (ref 1.9–3.7)
Glucose, Bld: 86 mg/dL (ref 65–99)
Potassium: 4.1 mmol/L (ref 3.5–5.3)
Sodium: 142 mmol/L (ref 135–146)
Total Bilirubin: 0.4 mg/dL (ref 0.2–1.2)
Total Protein: 6.9 g/dL (ref 6.1–8.1)

## 2018-10-21 LAB — TSH: TSH: 3.89 mIU/L (ref 0.40–4.50)

## 2018-10-21 LAB — CBC
HCT: 34.3 % — ABNORMAL LOW (ref 35.0–45.0)
Hemoglobin: 11.3 g/dL — ABNORMAL LOW (ref 11.7–15.5)
MCH: 27.6 pg (ref 27.0–33.0)
MCHC: 32.9 g/dL (ref 32.0–36.0)
MCV: 83.7 fL (ref 80.0–100.0)
MPV: 11.1 fL (ref 7.5–12.5)
Platelets: 363 10*3/uL (ref 140–400)
RBC: 4.1 10*6/uL (ref 3.80–5.10)
RDW: 14.3 % (ref 11.0–15.0)
WBC: 5.9 10*3/uL (ref 3.8–10.8)

## 2018-10-21 LAB — LIPID PANEL
Cholesterol: 251 mg/dL — ABNORMAL HIGH (ref <200)
HDL: 123 mg/dL (ref >=50)
LDL Cholesterol (Calc): 113 mg/dL (calc) — ABNORMAL HIGH
Non-HDL Cholesterol (Calc): 128 mg/dL (calc) (ref <130)
Total CHOL/HDL Ratio: 2 (calc) (ref <5.0)
Triglycerides: 67 mg/dL (ref <150)

## 2018-10-21 LAB — HEPATITIS C ANTIBODY
Hepatitis C Ab: NONREACTIVE
SIGNAL TO CUT-OFF: 0.07 (ref <1.00)

## 2018-11-07 DIAGNOSIS — F411 Generalized anxiety disorder: Secondary | ICD-10-CM | POA: Diagnosis not present

## 2018-11-07 DIAGNOSIS — F331 Major depressive disorder, recurrent, moderate: Secondary | ICD-10-CM | POA: Diagnosis not present

## 2018-12-04 ENCOUNTER — Other Ambulatory Visit: Payer: Self-pay | Admitting: Internal Medicine

## 2018-12-19 DIAGNOSIS — F331 Major depressive disorder, recurrent, moderate: Secondary | ICD-10-CM | POA: Diagnosis not present

## 2018-12-19 DIAGNOSIS — F411 Generalized anxiety disorder: Secondary | ICD-10-CM | POA: Diagnosis not present

## 2019-01-23 DIAGNOSIS — M25551 Pain in right hip: Secondary | ICD-10-CM | POA: Diagnosis not present

## 2019-01-29 DIAGNOSIS — M25551 Pain in right hip: Secondary | ICD-10-CM | POA: Diagnosis not present

## 2019-02-20 DIAGNOSIS — F331 Major depressive disorder, recurrent, moderate: Secondary | ICD-10-CM | POA: Diagnosis not present

## 2019-02-20 DIAGNOSIS — F411 Generalized anxiety disorder: Secondary | ICD-10-CM | POA: Diagnosis not present

## 2019-02-27 ENCOUNTER — Other Ambulatory Visit: Payer: Self-pay | Admitting: Internal Medicine

## 2019-04-17 DIAGNOSIS — F331 Major depressive disorder, recurrent, moderate: Secondary | ICD-10-CM | POA: Diagnosis not present

## 2019-04-17 DIAGNOSIS — F411 Generalized anxiety disorder: Secondary | ICD-10-CM | POA: Diagnosis not present

## 2019-05-06 DIAGNOSIS — F331 Major depressive disorder, recurrent, moderate: Secondary | ICD-10-CM | POA: Diagnosis not present

## 2019-05-06 DIAGNOSIS — F411 Generalized anxiety disorder: Secondary | ICD-10-CM | POA: Diagnosis not present

## 2019-05-25 ENCOUNTER — Other Ambulatory Visit: Payer: Self-pay | Admitting: Internal Medicine

## 2019-06-25 ENCOUNTER — Other Ambulatory Visit: Payer: Self-pay | Admitting: Family Medicine

## 2019-06-25 DIAGNOSIS — Z1231 Encounter for screening mammogram for malignant neoplasm of breast: Secondary | ICD-10-CM

## 2019-07-18 DIAGNOSIS — N952 Postmenopausal atrophic vaginitis: Secondary | ICD-10-CM | POA: Diagnosis not present

## 2019-07-18 DIAGNOSIS — N941 Unspecified dyspareunia: Secondary | ICD-10-CM | POA: Diagnosis not present

## 2019-07-18 DIAGNOSIS — Z01411 Encounter for gynecological examination (general) (routine) with abnormal findings: Secondary | ICD-10-CM | POA: Diagnosis not present

## 2019-07-24 DIAGNOSIS — F331 Major depressive disorder, recurrent, moderate: Secondary | ICD-10-CM | POA: Diagnosis not present

## 2019-07-24 DIAGNOSIS — F411 Generalized anxiety disorder: Secondary | ICD-10-CM | POA: Diagnosis not present

## 2019-08-20 ENCOUNTER — Other Ambulatory Visit: Payer: Self-pay | Admitting: Internal Medicine

## 2019-08-21 DIAGNOSIS — Z23 Encounter for immunization: Secondary | ICD-10-CM | POA: Diagnosis not present

## 2019-08-22 ENCOUNTER — Ambulatory Visit
Admission: RE | Admit: 2019-08-22 | Discharge: 2019-08-22 | Disposition: A | Discharge status: Home / Self Care | Payer: BC Managed Care – PPO | Source: Ambulatory Visit | Attending: Family Medicine | Admitting: Family Medicine

## 2019-08-22 ENCOUNTER — Other Ambulatory Visit: Payer: Self-pay

## 2019-08-22 DIAGNOSIS — Z1231 Encounter for screening mammogram for malignant neoplasm of breast: Secondary | ICD-10-CM | POA: Diagnosis not present

## 2019-08-26 ENCOUNTER — Other Ambulatory Visit: Payer: Self-pay | Admitting: Family Medicine

## 2019-08-26 DIAGNOSIS — R928 Other abnormal and inconclusive findings on diagnostic imaging of breast: Secondary | ICD-10-CM

## 2019-09-04 ENCOUNTER — Other Ambulatory Visit: Payer: Self-pay | Admitting: Family Medicine

## 2019-09-04 ENCOUNTER — Other Ambulatory Visit: Payer: Self-pay

## 2019-09-04 ENCOUNTER — Ambulatory Visit
Admission: RE | Admit: 2019-09-04 | Discharge: 2019-09-04 | Disposition: A | Discharge status: Home / Self Care | Payer: BC Managed Care – PPO | Source: Ambulatory Visit | Attending: Family Medicine | Admitting: Family Medicine

## 2019-09-04 DIAGNOSIS — R928 Other abnormal and inconclusive findings on diagnostic imaging of breast: Secondary | ICD-10-CM

## 2019-09-04 DIAGNOSIS — N6489 Other specified disorders of breast: Secondary | ICD-10-CM | POA: Diagnosis not present

## 2019-09-04 DIAGNOSIS — F331 Major depressive disorder, recurrent, moderate: Secondary | ICD-10-CM | POA: Diagnosis not present

## 2019-09-04 DIAGNOSIS — F411 Generalized anxiety disorder: Secondary | ICD-10-CM | POA: Diagnosis not present

## 2019-09-04 DIAGNOSIS — R921 Mammographic calcification found on diagnostic imaging of breast: Secondary | ICD-10-CM

## 2019-09-10 ENCOUNTER — Ambulatory Visit
Admission: RE | Admit: 2019-09-10 | Discharge: 2019-09-10 | Disposition: A | Discharge status: Home / Self Care | Payer: BC Managed Care – PPO | Source: Ambulatory Visit | Attending: Family Medicine | Admitting: Family Medicine

## 2019-09-10 ENCOUNTER — Other Ambulatory Visit: Payer: Self-pay

## 2019-09-10 DIAGNOSIS — N6489 Other specified disorders of breast: Secondary | ICD-10-CM

## 2019-09-10 DIAGNOSIS — R928 Other abnormal and inconclusive findings on diagnostic imaging of breast: Secondary | ICD-10-CM | POA: Diagnosis not present

## 2019-09-10 DIAGNOSIS — N6012 Diffuse cystic mastopathy of left breast: Secondary | ICD-10-CM | POA: Diagnosis not present

## 2019-09-18 DIAGNOSIS — Z23 Encounter for immunization: Secondary | ICD-10-CM | POA: Diagnosis not present

## 2019-09-24 ENCOUNTER — Ambulatory Visit: Payer: Self-pay | Admitting: General Surgery

## 2019-09-24 DIAGNOSIS — N6320 Unspecified lump in the left breast, unspecified quadrant: Secondary | ICD-10-CM

## 2019-10-01 ENCOUNTER — Other Ambulatory Visit: Payer: Self-pay | Admitting: General Surgery

## 2019-10-01 DIAGNOSIS — N6320 Unspecified lump in the left breast, unspecified quadrant: Secondary | ICD-10-CM

## 2019-10-22 ENCOUNTER — Other Ambulatory Visit: Payer: Self-pay | Admitting: Family Medicine

## 2019-10-30 DIAGNOSIS — F331 Major depressive disorder, recurrent, moderate: Secondary | ICD-10-CM | POA: Diagnosis not present

## 2019-10-30 DIAGNOSIS — F411 Generalized anxiety disorder: Secondary | ICD-10-CM | POA: Diagnosis not present

## 2019-10-31 ENCOUNTER — Encounter: Payer: BLUE CROSS/BLUE SHIELD | Admitting: Family Medicine

## 2019-11-12 ENCOUNTER — Other Ambulatory Visit: Payer: Self-pay

## 2019-11-12 ENCOUNTER — Encounter (HOSPITAL_BASED_OUTPATIENT_CLINIC_OR_DEPARTMENT_OTHER): Payer: Self-pay | Admitting: General Surgery

## 2019-11-13 HISTORY — PX: BREAST EXCISIONAL BIOPSY: SUR124

## 2019-11-17 ENCOUNTER — Inpatient Hospital Stay (HOSPITAL_COMMUNITY): Admission: RE | Admit: 2019-11-17 | Payer: BC Managed Care – PPO | Source: Ambulatory Visit

## 2019-11-18 ENCOUNTER — Other Ambulatory Visit (HOSPITAL_COMMUNITY)
Admission: RE | Admit: 2019-11-18 | Discharge: 2019-11-18 | Disposition: A | Discharge status: Home / Self Care | Payer: BC Managed Care – PPO | Source: Ambulatory Visit | Attending: General Surgery | Admitting: General Surgery

## 2019-11-18 DIAGNOSIS — Z20822 Contact with and (suspected) exposure to covid-19: Secondary | ICD-10-CM | POA: Insufficient documentation

## 2019-11-18 DIAGNOSIS — Z01812 Encounter for preprocedural laboratory examination: Secondary | ICD-10-CM | POA: Diagnosis not present

## 2019-11-18 LAB — SARS CORONAVIRUS 2 (TAT 6-24 HRS): SARS Coronavirus 2: NEGATIVE

## 2019-11-18 NOTE — Progress Notes (Signed)

## 2019-11-20 ENCOUNTER — Ambulatory Visit
Admission: RE | Admit: 2019-11-20 | Discharge: 2019-11-20 | Disposition: A | Discharge status: Home / Self Care | Payer: BC Managed Care – PPO | Source: Ambulatory Visit | Attending: General Surgery | Admitting: General Surgery

## 2019-11-20 ENCOUNTER — Other Ambulatory Visit: Payer: Self-pay

## 2019-11-20 DIAGNOSIS — R928 Other abnormal and inconclusive findings on diagnostic imaging of breast: Secondary | ICD-10-CM | POA: Diagnosis not present

## 2019-11-20 DIAGNOSIS — N6320 Unspecified lump in the left breast, unspecified quadrant: Secondary | ICD-10-CM

## 2019-11-21 ENCOUNTER — Ambulatory Visit (HOSPITAL_BASED_OUTPATIENT_CLINIC_OR_DEPARTMENT_OTHER): Payer: BC Managed Care – PPO | Admitting: Certified Registered Nurse Anesthetist

## 2019-11-21 ENCOUNTER — Ambulatory Visit
Admission: RE | Admit: 2019-11-21 | Discharge: 2019-11-21 | Disposition: A | Discharge status: Home / Self Care | Payer: BC Managed Care – PPO | Source: Ambulatory Visit | Attending: General Surgery | Admitting: General Surgery

## 2019-11-21 ENCOUNTER — Encounter (HOSPITAL_BASED_OUTPATIENT_CLINIC_OR_DEPARTMENT_OTHER): Payer: Self-pay | Admitting: General Surgery

## 2019-11-21 ENCOUNTER — Ambulatory Visit (HOSPITAL_BASED_OUTPATIENT_CLINIC_OR_DEPARTMENT_OTHER)
Admission: RE | Admit: 2019-11-21 | Discharge: 2019-11-21 | Disposition: A | Discharge status: Home / Self Care | Payer: BC Managed Care – PPO | Attending: General Surgery | Admitting: General Surgery

## 2019-11-21 ENCOUNTER — Other Ambulatory Visit: Payer: Self-pay

## 2019-11-21 ENCOUNTER — Encounter (HOSPITAL_BASED_OUTPATIENT_CLINIC_OR_DEPARTMENT_OTHER)
Admission: RE | Disposition: A | Discharge status: Home / Self Care | Payer: Self-pay | Source: Home / Self Care | Attending: General Surgery

## 2019-11-21 DIAGNOSIS — Z803 Family history of malignant neoplasm of breast: Secondary | ICD-10-CM | POA: Insufficient documentation

## 2019-11-21 DIAGNOSIS — F329 Major depressive disorder, single episode, unspecified: Secondary | ICD-10-CM | POA: Diagnosis not present

## 2019-11-21 DIAGNOSIS — Z79890 Hormone replacement therapy (postmenopausal): Secondary | ICD-10-CM | POA: Diagnosis not present

## 2019-11-21 DIAGNOSIS — N650 Deformity of reconstructed breast: Secondary | ICD-10-CM | POA: Diagnosis not present

## 2019-11-21 DIAGNOSIS — Z79899 Other long term (current) drug therapy: Secondary | ICD-10-CM | POA: Insufficient documentation

## 2019-11-21 DIAGNOSIS — F419 Anxiety disorder, unspecified: Secondary | ICD-10-CM | POA: Diagnosis not present

## 2019-11-21 DIAGNOSIS — N6489 Other specified disorders of breast: Secondary | ICD-10-CM | POA: Diagnosis not present

## 2019-11-21 DIAGNOSIS — Z882 Allergy status to sulfonamides status: Secondary | ICD-10-CM | POA: Insufficient documentation

## 2019-11-21 DIAGNOSIS — I341 Nonrheumatic mitral (valve) prolapse: Secondary | ICD-10-CM | POA: Insufficient documentation

## 2019-11-21 DIAGNOSIS — E039 Hypothyroidism, unspecified: Secondary | ICD-10-CM | POA: Insufficient documentation

## 2019-11-21 DIAGNOSIS — K219 Gastro-esophageal reflux disease without esophagitis: Secondary | ICD-10-CM | POA: Diagnosis not present

## 2019-11-21 DIAGNOSIS — R928 Other abnormal and inconclusive findings on diagnostic imaging of breast: Secondary | ICD-10-CM | POA: Diagnosis not present

## 2019-11-21 DIAGNOSIS — N6012 Diffuse cystic mastopathy of left breast: Secondary | ICD-10-CM | POA: Diagnosis not present

## 2019-11-21 DIAGNOSIS — Z881 Allergy status to other antibiotic agents status: Secondary | ICD-10-CM | POA: Diagnosis not present

## 2019-11-21 DIAGNOSIS — N6320 Unspecified lump in the left breast, unspecified quadrant: Secondary | ICD-10-CM

## 2019-11-21 DIAGNOSIS — I739 Peripheral vascular disease, unspecified: Secondary | ICD-10-CM | POA: Insufficient documentation

## 2019-11-21 HISTORY — DX: Nausea with vomiting, unspecified: R11.2

## 2019-11-21 HISTORY — PX: BREAST LUMPECTOMY WITH RADIOACTIVE SEED LOCALIZATION: SHX6424

## 2019-11-21 HISTORY — PX: BREAST BIOPSY: SHX20

## 2019-11-21 HISTORY — DX: Other specified postprocedural states: Z98.890

## 2019-11-21 SURGERY — BREAST LUMPECTOMY WITH RADIOACTIVE SEED LOCALIZATION
Anesthesia: General | Site: Breast | Laterality: Left

## 2019-11-21 MED ORDER — FENTANYL CITRATE (PF) 100 MCG/2ML IJ SOLN
INTRAMUSCULAR | Status: DC | PRN
  Administered 2019-11-21: 50 ug via INTRAVENOUS

## 2019-11-21 MED ORDER — MIDAZOLAM HCL 2 MG/2ML IJ SOLN
INTRAMUSCULAR | Status: AC
  Filled 2019-11-21: qty 2

## 2019-11-21 MED ORDER — CHLORHEXIDINE GLUCONATE CLOTH 2 % EX PADS: 6.0000 | MEDICATED_PAD | Freq: Once | CUTANEOUS | Status: DC

## 2019-11-21 MED ORDER — MIDAZOLAM HCL 2 MG/2ML IJ SOLN: 1.0000 mg | INTRAMUSCULAR | Status: DC | PRN

## 2019-11-21 MED ORDER — DEXAMETHASONE SODIUM PHOSPHATE 10 MG/ML IJ SOLN
INTRAMUSCULAR | Status: DC | PRN
  Administered 2019-11-21: 4 mg via INTRAVENOUS

## 2019-11-21 MED ORDER — GABAPENTIN 300 MG PO CAPS
300.0000 mg | ORAL_CAPSULE | ORAL | Status: AC
  Administered 2019-11-21: 300 mg via ORAL

## 2019-11-21 MED ORDER — LIDOCAINE 2% (20 MG/ML) 5 ML SYRINGE
INTRAMUSCULAR | Status: AC
  Filled 2019-11-21: qty 5

## 2019-11-21 MED ORDER — ONDANSETRON HCL 4 MG/2ML IJ SOLN
INTRAMUSCULAR | Status: DC | PRN
  Administered 2019-11-21: 4 mg via INTRAVENOUS

## 2019-11-21 MED ORDER — MIDAZOLAM HCL 2 MG/2ML IJ SOLN
INTRAMUSCULAR | Status: DC | PRN
  Administered 2019-11-21: 2 mg via INTRAVENOUS

## 2019-11-21 MED ORDER — BUPIVACAINE-EPINEPHRINE (PF) 0.25% -1:200000 IJ SOLN
INTRAMUSCULAR | Status: DC | PRN
  Administered 2019-11-21: 18 mL via PERINEURAL

## 2019-11-21 MED ORDER — ONDANSETRON HCL 4 MG/2ML IJ SOLN
INTRAMUSCULAR | Status: AC
  Filled 2019-11-21: qty 2

## 2019-11-21 MED ORDER — PROPOFOL 10 MG/ML IV BOLUS
INTRAVENOUS | Status: AC
  Filled 2019-11-21: qty 20

## 2019-11-21 MED ORDER — PROMETHAZINE HCL 25 MG/ML IJ SOLN: 6.2500 mg | INTRAMUSCULAR | Status: DC | PRN

## 2019-11-21 MED ORDER — LIDOCAINE HCL (CARDIAC) PF 100 MG/5ML IV SOSY
PREFILLED_SYRINGE | INTRAVENOUS | Status: DC | PRN
  Administered 2019-11-21: 80 mg via INTRAVENOUS

## 2019-11-21 MED ORDER — CEFAZOLIN SODIUM-DEXTROSE 2-4 GM/100ML-% IV SOLN
2.0000 g | INTRAVENOUS | Status: AC
  Administered 2019-11-21: 2 g via INTRAVENOUS

## 2019-11-21 MED ORDER — ACETAMINOPHEN 500 MG PO TABS
1000.0000 mg | ORAL_TABLET | ORAL | Status: AC
  Administered 2019-11-21: 1000 mg via ORAL

## 2019-11-21 MED ORDER — OXYCODONE HCL 5 MG PO TABS
5.0000 mg | ORAL_TABLET | Freq: Once | ORAL | Status: AC | PRN
  Administered 2019-11-21: 5 mg via ORAL

## 2019-11-21 MED ORDER — FENTANYL CITRATE (PF) 100 MCG/2ML IJ SOLN
INTRAMUSCULAR | Status: AC
  Filled 2019-11-21: qty 2

## 2019-11-21 MED ORDER — FENTANYL CITRATE (PF) 100 MCG/2ML IJ SOLN: 50.0000 ug | INTRAMUSCULAR | Status: DC | PRN

## 2019-11-21 MED ORDER — HYDROCODONE-ACETAMINOPHEN 5-325 MG PO TABS: 1.0000 | ORAL_TABLET | Freq: Four times a day (QID) | ORAL | 0 refills | Status: DC | PRN

## 2019-11-21 MED ORDER — OXYCODONE HCL 5 MG PO TABS
ORAL_TABLET | ORAL | Status: AC
  Filled 2019-11-21: qty 1

## 2019-11-21 MED ORDER — LACTATED RINGERS IV SOLN: INTRAVENOUS | Status: DC

## 2019-11-21 MED ORDER — CEFAZOLIN SODIUM-DEXTROSE 2-4 GM/100ML-% IV SOLN
INTRAVENOUS | Status: AC
  Filled 2019-11-21: qty 100

## 2019-11-21 MED ORDER — SCOPOLAMINE 1 MG/3DAYS TD PT72
MEDICATED_PATCH | TRANSDERMAL | Status: AC
  Filled 2019-11-21: qty 1

## 2019-11-21 MED ORDER — GABAPENTIN 300 MG PO CAPS
ORAL_CAPSULE | ORAL | Status: AC
  Filled 2019-11-21: qty 1

## 2019-11-21 MED ORDER — SCOPOLAMINE 1 MG/3DAYS TD PT72
1.0000 | MEDICATED_PATCH | Freq: Once | TRANSDERMAL | Status: DC
  Administered 2019-11-21: 1.5 mg via TRANSDERMAL

## 2019-11-21 MED ORDER — PROPOFOL 10 MG/ML IV BOLUS
INTRAVENOUS | Status: DC | PRN
  Administered 2019-11-21: 150 mg via INTRAVENOUS

## 2019-11-21 MED ORDER — ACETAMINOPHEN 500 MG PO TABS
ORAL_TABLET | ORAL | Status: AC
  Filled 2019-11-21: qty 2

## 2019-11-21 MED ORDER — FENTANYL CITRATE (PF) 100 MCG/2ML IJ SOLN: 25.0000 ug | INTRAMUSCULAR | Status: DC | PRN

## 2019-11-21 SURGICAL SUPPLY — 51 items
ADH SKN CLS APL DERMABOND .7 (GAUZE/BANDAGES/DRESSINGS) ×1
APL PRP STRL LF DISP 70% ISPRP (MISCELLANEOUS) ×1
APPLIER CLIP 9.375 MED OPEN (MISCELLANEOUS)
APR CLP MED 9.3 20 MLT OPN (MISCELLANEOUS)
BLADE SURG 15 STRL LF DISP TIS (BLADE) ×1 IMPLANT
BLADE SURG 15 STRL SS (BLADE) ×6
CANISTER SUC SOCK COL 7IN (MISCELLANEOUS) ×3 IMPLANT
CANISTER SUCT 1200ML W/VALVE (MISCELLANEOUS) ×3 IMPLANT
CHLORAPREP W/TINT 26 (MISCELLANEOUS) ×3 IMPLANT
CLIP APPLIE 9.375 MED OPEN (MISCELLANEOUS) IMPLANT
COVER BACK TABLE 60X90IN (DRAPES) ×3 IMPLANT
COVER MAYO STAND STRL (DRAPES) ×3 IMPLANT
COVER PROBE W GEL 5X96 (DRAPES) ×3 IMPLANT
COVER WAND RF STERILE (DRAPES) IMPLANT
DECANTER SPIKE VIAL GLASS SM (MISCELLANEOUS) IMPLANT
DERMABOND ADVANCED (GAUZE/BANDAGES/DRESSINGS) ×2
DERMABOND ADVANCED .7 DNX12 (GAUZE/BANDAGES/DRESSINGS) ×1 IMPLANT
DRAPE LAPAROSCOPIC ABDOMINAL (DRAPES) ×3 IMPLANT
DRAPE UTILITY XL STRL (DRAPES) ×3 IMPLANT
ELECT COATED BLADE 2.86 ST (ELECTRODE) ×3 IMPLANT
ELECT REM PT RETURN 9FT ADLT (ELECTROSURGICAL) ×3
ELECTRODE REM PT RTRN 9FT ADLT (ELECTROSURGICAL) ×1 IMPLANT
GLOVE BIO SURGEON STRL SZ7.5 (GLOVE) ×6 IMPLANT
GLOVE BIOGEL PI IND STRL 6.5 (GLOVE) IMPLANT
GLOVE BIOGEL PI IND STRL 7.0 (GLOVE) IMPLANT
GLOVE BIOGEL PI INDICATOR 6.5 (GLOVE) ×2
GLOVE BIOGEL PI INDICATOR 7.0 (GLOVE) ×4
GLOVE SURG SYN 7.5  E (GLOVE) ×3
GLOVE SURG SYN 7.5 E (GLOVE) ×1 IMPLANT
GLOVE SURG SYN 7.5 PF PI (GLOVE) IMPLANT
GOWN STRL REUS W/ TWL LRG LVL3 (GOWN DISPOSABLE) ×2 IMPLANT
GOWN STRL REUS W/TWL LRG LVL3 (GOWN DISPOSABLE) ×6
ILLUMINATOR WAVEGUIDE N/F (MISCELLANEOUS) IMPLANT
KIT MARKER MARGIN INK (KITS) ×3 IMPLANT
LIGHT WAVEGUIDE WIDE FLAT (MISCELLANEOUS) IMPLANT
NDL HYPO 25X1 1.5 SAFETY (NEEDLE) IMPLANT
NEEDLE HYPO 25X1 1.5 SAFETY (NEEDLE) IMPLANT
NS IRRIG 1000ML POUR BTL (IV SOLUTION) IMPLANT
PACK BASIN DAY SURGERY FS (CUSTOM PROCEDURE TRAY) ×3 IMPLANT
PENCIL SMOKE EVACUATOR (MISCELLANEOUS) ×3 IMPLANT
SLEEVE SCD COMPRESS KNEE MED (MISCELLANEOUS) ×3 IMPLANT
SPONGE LAP 18X18 RF (DISPOSABLE) ×3 IMPLANT
SUT MON AB 4-0 PC3 18 (SUTURE) ×3 IMPLANT
SUT SILK 2 0 SH (SUTURE) IMPLANT
SUT VICRYL 3-0 CR8 SH (SUTURE) ×3 IMPLANT
SYR CONTROL 10ML LL (SYRINGE) IMPLANT
TOWEL GREEN STERILE FF (TOWEL DISPOSABLE) ×3 IMPLANT
TRAY FAXITRON CT DISP (TRAY / TRAY PROCEDURE) ×3 IMPLANT
TUBE CONNECTING 20'X1/4 (TUBING) ×1
TUBE CONNECTING 20X1/4 (TUBING) ×2 IMPLANT
YANKAUER SUCT BULB TIP NO VENT (SUCTIONS) IMPLANT

## 2019-11-21 NOTE — Anesthesia Procedure Notes (Signed)
Procedure Name: LMA Insertion Date/Time: 11/21/2019 9:46 AM Performed by: Yolonda Kida, CRNA Pre-anesthesia Checklist: Patient identified, Emergency Drugs available, Suction available and Patient being monitored Patient Re-evaluated:Patient Re-evaluated prior to induction Oxygen Delivery Method: Circle system utilized Preoxygenation: Pre-oxygenation with 100% oxygen Induction Type: IV induction LMA: LMA inserted LMA Size: 4.0 Number of attempts: 1 Placement Confirmation: positive ETCO2 and breath sounds checked- equal and bilateral Tube secured with: Tape Dental Injury: Teeth and Oropharynx as per pre-operative assessment

## 2019-11-21 NOTE — Anesthesia Preprocedure Evaluation (Signed)
Anesthesia Evaluation  Patient identified by MRN, date of birth, ID band Patient awake    Reviewed: Allergy & Precautions, NPO status , Patient's Chart, lab work & pertinent test results  History of Anesthesia Complications (+) PONV and history of anesthetic complications  Airway Mallampati: II  TM Distance: >3 FB Neck ROM: Full    Dental  (+) Teeth Intact, Dental Advisory Given   Pulmonary neg pulmonary ROS,    Pulmonary exam normal breath sounds clear to auscultation       Cardiovascular Exercise Tolerance: Good + Peripheral Vascular Disease  Normal cardiovascular exam+ Valvular Problems/Murmurs MVP  Rhythm:Regular Rate:Normal     Neuro/Psych PSYCHIATRIC DISORDERS Anxiety Depression TIA   GI/Hepatic Neg liver ROS, GERD  Medicated and Controlled,  Endo/Other  Hypothyroidism   Renal/GU negative Renal ROS     Musculoskeletal  (+) Arthritis ,   Abdominal   Peds  Hematology negative hematology ROS (+)   Anesthesia Other Findings Day of surgery medications reviewed with the patient.  Reproductive/Obstetrics                             Anesthesia Physical Anesthesia Plan  ASA: II  Anesthesia Plan: General   Post-op Pain Management:    Induction: Intravenous  PONV Risk Score and Plan: 4 or greater and Scopolamine patch - Pre-op, Midazolam, Dexamethasone, Ondansetron and Diphenhydramine  Airway Management Planned: LMA  Additional Equipment:   Intra-op Plan:   Post-operative Plan: Extubation in OR  Informed Consent: I have reviewed the patients History and Physical, chart, labs and discussed the procedure including the risks, benefits and alternatives for the proposed anesthesia with the patient or authorized representative who has indicated his/her understanding and acceptance.     Dental advisory given  Plan Discussed with: CRNA  Anesthesia Plan Comments:          Anesthesia Quick Evaluation

## 2019-11-21 NOTE — Transfer of Care (Signed)
Immediate Anesthesia Transfer of Care Note  Patient: [name] Case  Procedure(s) Performed: LEFT BREAST LUMPECTOMY WITH RADIOACTIVE SEED LOCALIZATION (Left Breast)  Patient Location: PACU  Anesthesia Type:General  Level of Consciousness: awake, alert , oriented, drowsy and patient cooperative  Airway & Oxygen Therapy: Patient Spontanous Breathing and Patient connected to face mask oxygen  Post-op Assessment: Report given to RN and Post -op Vital signs reviewed and stable  Post vital signs: Reviewed and stable  Last Vitals:  Vitals Value Taken Time  BP 118/74 11/21/19 1026  Temp    Pulse 58 11/21/19 1028  Resp 15 11/21/19 1028  SpO2 100 % 11/21/19 1028  Vitals shown include unvalidated device data.  Last Pain:  Vitals:   11/21/19 0810  TempSrc: Oral  PainSc: 0-No pain         Complications: No apparent anesthesia complications

## 2019-11-21 NOTE — Interval H&P Note (Signed)
History and Physical Interval Note:  11/21/2019 9:23 AM  [name] Case  has presented today for surgery, with the diagnosis of LEFT BREAST DISTORTION.  The various methods of treatment have been discussed with the patient and family. After consideration of risks, benefits and other options for treatment, the patient has consented to  Procedure(s): LEFT BREAST LUMPECTOMY WITH RADIOACTIVE SEED LOCALIZATION (Left) as a surgical intervention.  The patient's history has been reviewed, patient examined, no change in status, stable for surgery.  I have reviewed the patient's chart and labs.  Questions were answered to the patient's satisfaction.     [name]

## 2019-11-21 NOTE — Discharge Instructions (Signed)
        No Tylenol until 2:15 PM on 11/21/2019    Post Anesthesia Home Care Instructions  Activity: Get plenty of rest for the remainder of the day. A responsible individual must stay with you for 24 hours following the procedure.  For the next 24 hours, DO NOT: -Drive a car -Advertising copywriter -Drink alcoholic beverages -Take any medication unless instructed by your physician -Make any legal decisions or sign important papers.  Meals: Start with liquid foods such as gelatin or soup. Progress to regular foods as tolerated. Avoid greasy, spicy, heavy foods. If nausea and/or vomiting occur, drink only clear liquids until the nausea and/or vomiting subsides. Call your physician if vomiting continues.  Special Instructions/Symptoms: Your throat may feel dry or sore from the anesthesia or the breathing tube placed in your throat during surgery. If this causes discomfort, gargle with warm salt water. The discomfort should disappear within 24 hours.  If you had a scopolamine patch placed behind your ear for the management of post- operative nausea and/or vomiting:  1. The medication in the patch is effective for 72 hours, after which it should be removed.  Wrap patch in a tissue and discard in the trash. Wash hands thoroughly with soap and water. 2. You may remove the patch earlier than 72 hours if you experience unpleasant side effects which may include dry mouth, dizziness or visual disturbances. 3. Avoid touching the patch. Wash your hands with soap and water after contact with the patch.

## 2019-11-21 NOTE — H&P (Signed)
[name] Case Headings  Location: [facility] Patient #: [id number] DOB: 04-May-1961 Married / Language: English / Race: White Female   History of Present Illness  The patient is a 59 year old female who presents with a breast mass. We are asked to see the patient in consultation by Dr. Tana Conch to evaluate her for a area of discordance in the left breast. The patient is a 59 year old white female who recently went for a routine screening mammogram. At that time she was found to have a small area of distortion in the upper portion of the left breast. This was biopsied and came back as fibrocystic changes with calcification. The radiologist felt that this was discordant and recommended that this area be removed. She is otherwise in good health. She does not smoke. She does have a family history of breast cancer and a maternal aunt.   Past Surgical History  Appendectomy  Breast Biopsy  Left.  Diagnostic Studies History  Colonoscopy  1-5 years ago Mammogram  within last year Pap Smear  1-5 years ago  Allergies  Sulfa Antibiotics  Tetracycline *CHEMICALS*  Allergies Reconciled   Medication History  buPROPion HCl ER (XL) (300MG  Tablet ER 24HR, Oral) Active. Dexilant (60MG  Capsule DR, Oral) Active. Levothyroxine Sodium ( Tablet, Oral) Active. Zolpidem Tartrate ER (12.5MG  Tablet ER, Oral) Active. Medications Reconciled  Social History  Alcohol use  Occasional alcohol use. Caffeine use  Coffee. No drug use  Tobacco use  Never smoker.  Family History  Breast Cancer  Family Members In General. Depression  Brother, Father. Hypertension  Brother, Father. Thyroid problems  Father.  Pregnancy / Birth History  Age at menarche  13 years. Age of menopause  37-55 Gravida  2 Length (months) of breastfeeding  7-12 Maternal age  54-25 Para  2  Other Problems  Anxiety Disorder  Depression  Gastric Ulcer  Gastroesophageal Reflux  Disease  Heart murmur  Thyroid Disease     Review of Systems General Not Present- Appetite Loss, Chills, Fatigue, Fever, Night Sweats, Weight Gain and Weight Loss. Skin Not Present- Change in Wart/Mole, Dryness, Hives, Jaundice, New Lesions, Non-Healing Wounds, Rash and Ulcer. HEENT Present- Wears glasses/contact lenses. Not Present- Earache, Hearing Loss, Hoarseness, Nose Bleed, Oral Ulcers, Ringing in the Ears, Seasonal Allergies, Sinus Pain, Sore Throat, Visual Disturbances and Yellow Eyes. Respiratory Not Present- Bloody sputum, Chronic Cough, Difficulty Breathing, Snoring and Wheezing. Cardiovascular Not Present- Chest Pain, Difficulty Breathing Lying Down, Leg Cramps, Palpitations, Rapid Heart Rate, Shortness of Breath and Swelling of Extremities. Gastrointestinal Present- Bloating and Constipation. Not Present- Abdominal Pain, Bloody Stool, Change in Bowel Habits, Chronic diarrhea, Difficulty Swallowing, Excessive gas, Gets full quickly at meals, Hemorrhoids, Indigestion, Nausea, Rectal Pain and Vomiting. Female Genitourinary Not Present- Frequency, Nocturia, Painful Urination, Pelvic Pain and Urgency. Musculoskeletal Not Present- Back Pain, Joint Pain, Joint Stiffness, Muscle Pain, Muscle Weakness and Swelling of Extremities. Neurological Not Present- Decreased Memory, Fainting, Headaches, Numbness, Seizures, Tingling, Tremor, Trouble walking and Weakness. Psychiatric Present- Anxiety and Depression. Not Present- Bipolar, Change in Sleep Pattern, Fearful and Frequent crying. Endocrine Not Present- Cold Intolerance, Excessive Hunger, Hair Changes, Heat Intolerance, Hot flashes and New Diabetes. Hematology Not Present- Blood Thinners, Easy Bruising, Excessive bleeding, Gland problems, HIV and Persistent Infections.   Physical Exam  General Mental Status-Alert. General Appearance-Consistent with stated age. Hydration-Well hydrated. Voice-Normal.  Head and  Neck Head-normocephalic, atraumatic with no lesions or palpable masses. Trachea-midline. Thyroid Gland Characteristics - normal size and consistency.  Eye Eyeball -  Bilateral-Extraocular movements intact. Sclera/Conjunctiva - Bilateral-No scleral icterus.  Chest and Lung Exam Chest and lung exam reveals -quiet, even and easy respiratory effort with no use of accessory muscles and on auscultation, normal breath sounds, no adventitious sounds and normal vocal resonance. Inspection Chest Wall - Normal. Back - normal.  Breast Note: There is a moderate sized hematoma in the upper portion of the left breast. Other than this there is no other palpable mass in either breast. There is no palpable axillary, supraclavicular, or cervical lymphadenopathy.   Cardiovascular Cardiovascular examination reveals -normal heart sounds, regular rate and rhythm with no murmurs and normal pedal pulses bilaterally.  Abdomen Inspection Inspection of the abdomen reveals - No Hernias. Skin - Scar - no surgical scars. Palpation/Percussion Palpation and Percussion of the abdomen reveal - Soft, Non Tender, No Rebound tenderness, No Rigidity (guarding) and No hepatosplenomegaly. Auscultation Auscultation of the abdomen reveals - Bowel sounds normal.  Neurologic Neurologic evaluation reveals -alert and oriented x 3 with no impairment of recent or remote memory. Mental Status-Normal.  Musculoskeletal Normal Exam - Left-Upper Extremity Strength Normal and Lower Extremity Strength Normal. Normal Exam - Right-Upper Extremity Strength Normal and Lower Extremity Strength Normal.  Lymphatic Head & Neck  General Head & Neck Lymphatics: Bilateral - Description - Normal. Axillary  General Axillary Region: Bilateral - Description - Normal. Tenderness - Non Tender. Femoral & Inguinal  Generalized Femoral & Inguinal Lymphatics: Bilateral - Description - Normal. Tenderness - Non  Tender.    Assessment & Plan LEFT BREAST MASS (N63.20) Impression: The patient appears to have a small area of distortion in the upper portion of the left breast that was benign on biopsy but felt to be discordant by the radiologist. Because of this the recommendation is to have this area removed surgically. I have discussed with her in detail the risks and benefits of the operation as well as some of the technical aspects including the use of a radioactive seed for localization and she understands and wishes to proceed. She is also interested in genetic testing although she only has 1 relative with breast cancer. I will go ahead and refer her for genetic counseling at the cancer center. Current Plans Referred to Genetic Counseling, for evaluation and follow up (Medical Genetics). Routine.

## 2019-11-21 NOTE — Anesthesia Postprocedure Evaluation (Signed)
Anesthesia Post Note  Patient: [name] Case  Procedure(s) Performed: LEFT BREAST LUMPECTOMY WITH RADIOACTIVE SEED LOCALIZATION (Left Breast)     Patient location during evaluation: PACU Anesthesia Type: General Level of consciousness: awake and alert Pain management: pain level controlled Vital Signs Assessment: post-procedure vital signs reviewed and stable Respiratory status: spontaneous breathing, nonlabored ventilation and respiratory function stable Cardiovascular status: blood pressure returned to baseline and stable Postop Assessment: no apparent nausea or vomiting Anesthetic complications: no    Last Vitals:  Vitals:   11/21/19 1030 11/21/19 1045  BP: 113/70 122/72  Pulse: (!) 56 (!) 59  Resp: 14 15  Temp: 36.8 C   SpO2: 100% 100%    Last Pain:  Vitals:   11/21/19 1045  TempSrc:   PainSc: 0-No pain                 Cecile Hearing

## 2019-11-21 NOTE — Op Note (Signed)
11/21/2019  10:21 AM  PATIENT:  [name] Case  59 y.o. female  PRE-OPERATIVE DIAGNOSIS:  LEFT BREAST DISTORTION  POST-OPERATIVE DIAGNOSIS:  LEFT BREAST DISTORTION  PROCEDURE:  Procedure(s): LEFT BREAST LUMPECTOMY WITH RADIOACTIVE SEED LOCALIZATION (Left)  SURGEON:  Surgeon(s) and Role:    * Griselda Miner, MD - Primary  PHYSICIAN ASSISTANT:   ASSISTANTS: none   ANESTHESIA:   local and general  EBL:  10 mL   BLOOD ADMINISTERED:none  DRAINS: none   LOCAL MEDICATIONS USED:  MARCAINE     SPECIMEN:  Source of Specimen:  left breast tissue  DISPOSITION OF SPECIMEN:  PATHOLOGY  COUNTS:  YES  TOURNIQUET:  * No tourniquets in log *  DICTATION: .Dragon Dictation   After informed consent was obtained the patient was brought to the operating room and placed in the supine position on the operating table.  After adequate induction of general anesthesia the patient's left breast was prepped with Betadine and draped in usual sterile manner.  An appropriate timeout was performed.  Previously an I-125 seed was placed in the upper portion of the left breast to mark an area of distortion that was felt to be discordant.  The neoprobe was set to I-125 in the area of radioactivity was readily identified centrally in the upper left breast.  The area around this was infiltrated with quarter percent Marcaine.  A curvilinear incision was made along the upper edge of the areola of the left breast with a 15 blade knife.  The incision was carried through the skin and subcutaneous tissue sharply with the electrocautery.  Dissection was then carried towards the radioactive seed under the direction of the neoprobe.  Once more closely approached the radioactive seed I then removed a circular portion of breast tissue sharply with the electrocautery around the radioactive seed while checking the area of radioactivity frequently.  Once the specimen was removed it was oriented with the appropriate paint  colors.  A specimen radiograph was obtained that showed the clip and seed to be near the center of the specimen.  The specimen was sent to pathology for further evaluation.  Hemostasis was achieved using the Bovie electrocautery.  The wound was irrigated with saline and infiltrated with more quarter percent Marcaine.  The deep layer of the wound was closed with layers of interrupted 3-0 Vicryl stitches.  The skin was closed with interrupted 4-0 Monocryl subcuticular stitches.  Dermabond dressings were applied.  The patient tolerated the procedure well.  At the end of the case all needle sponge and instrument counts were correct.  The patient was then awakened and taken to recovery in stable condition.`  PLAN OF CARE: Discharge to home after PACU  PATIENT DISPOSITION:  PACU - hemodynamically stable.   Delay start of Pharmacological VTE agent (>24hrs) due to surgical blood loss or risk of bleeding: not applicable

## 2019-11-23 ENCOUNTER — Encounter: Payer: Self-pay | Admitting: *Deleted

## 2019-11-24 LAB — SURGICAL PATHOLOGY

## 2019-11-30 ENCOUNTER — Other Ambulatory Visit: Payer: Self-pay | Admitting: Internal Medicine

## 2019-12-03 ENCOUNTER — Encounter: Payer: Self-pay | Admitting: Family Medicine

## 2019-12-03 ENCOUNTER — Other Ambulatory Visit: Payer: Self-pay

## 2019-12-03 ENCOUNTER — Ambulatory Visit (INDEPENDENT_AMBULATORY_CARE_PROVIDER_SITE_OTHER): Payer: BC Managed Care – PPO | Admitting: Family Medicine

## 2019-12-03 VITALS — BP 100/70 | HR 65 | Temp 98.4°F | Ht 66.0 in | Wt 123.4 lb

## 2019-12-03 DIAGNOSIS — Z79899 Other long term (current) drug therapy: Secondary | ICD-10-CM

## 2019-12-03 DIAGNOSIS — E039 Hypothyroidism, unspecified: Secondary | ICD-10-CM | POA: Diagnosis not present

## 2019-12-03 DIAGNOSIS — K219 Gastro-esophageal reflux disease without esophagitis: Secondary | ICD-10-CM | POA: Diagnosis not present

## 2019-12-03 DIAGNOSIS — F419 Anxiety disorder, unspecified: Secondary | ICD-10-CM

## 2019-12-03 DIAGNOSIS — Z780 Asymptomatic menopausal state: Secondary | ICD-10-CM

## 2019-12-03 DIAGNOSIS — Z0000 Encounter for general adult medical examination without abnormal findings: Secondary | ICD-10-CM

## 2019-12-03 DIAGNOSIS — E785 Hyperlipidemia, unspecified: Secondary | ICD-10-CM | POA: Diagnosis not present

## 2019-12-03 LAB — COMPREHENSIVE METABOLIC PANEL
ALT: 27 U/L (ref 0–35)
AST: 33 U/L (ref 0–37)
Albumin: 4.5 g/dL (ref 3.5–5.2)
Alkaline Phosphatase: 90 U/L (ref 39–117)
BUN: 14 mg/dL (ref 6–23)
CO2: 29 mEq/L (ref 19–32)
Calcium: 9.7 mg/dL (ref 8.4–10.5)
Chloride: 101 mEq/L (ref 96–112)
Creatinine, Ser: 0.74 mg/dL (ref 0.40–1.20)
GFR: 80.74 mL/min (ref >60.00)
Glucose, Bld: 90 mg/dL (ref 70–99)
Potassium: 4 mEq/L (ref 3.5–5.1)
Sodium: 139 mEq/L (ref 135–145)
Total Bilirubin: 0.5 mg/dL (ref 0.2–1.2)
Total Protein: 6.9 g/dL (ref 6.0–8.3)

## 2019-12-03 LAB — LIPID PANEL
Cholesterol: 257 mg/dL — ABNORMAL HIGH (ref 0–200)
HDL: 138.5 mg/dL (ref >39.00)
LDL Cholesterol: 109 mg/dL — ABNORMAL HIGH (ref 0–99)
NonHDL: 118.85
Total CHOL/HDL Ratio: 2
Triglycerides: 50 mg/dL (ref 0.0–149.0)
VLDL: 10 mg/dL (ref 0.0–40.0)

## 2019-12-03 LAB — VITAMIN B12: Vitamin B-12: 168 pg/mL — ABNORMAL LOW (ref 211–911)

## 2019-12-03 LAB — TSH: TSH: 0.55 u[IU]/mL (ref 0.35–4.50)

## 2019-12-03 NOTE — Patient Instructions (Addendum)
Health Maintenance Due  Topic Date Due  . PAP SMEAR-Modifier -has had done (request records) 04/28/2019   Let us defer Shingrix for now with all you have recently been through.  Schedule your bone density test at check out desk. You may also call directly to X-ray at [phone] to schedule an appointment that is convenient for you.  - located [address] across the street from [place] - in the basement - you do need an appointment for the bone density tests.   Please stop by lab before you go If you do not have mychart- we will call you about results within 5 business days of [place] receiving them.  If you have mychart- we will send your results within 3 business days of [place] receiving them.  If abnormal or we want to clarify a result, we will call or mychart you to make sure you receive the message.  If you have questions or concerns or don't hear within 5 business days, please send [place] a message or call us.

## 2019-12-03 NOTE — Progress Notes (Signed)
Phone: [phone]   Subjective:  Patient presents today for their annual physical. Chief complaint-noted.   See problem oriented charting- ROS- full  review of systems was completed and negative except for:  Occasionally gets reflux  The following were reviewed and entered/updated in epic: Past Medical History:  Diagnosis Date  . Abnormal EKG   . Allergy   . Anemia   . Anxiety   . Arthritis   . Blood transfusion without reported diagnosis   . Depression   . Disorder of sebaceous glands    sebaceous neoplasm  . Dysphagia 02/24/2011   Esophageal stretching x a few   . GERD 07/18/2007  . Heart murmur   . HYPOTHYROIDISM 07/18/2007  . Hypothyroidism 07/18/2007   Levothyroxine Lab Results  Component Value Date   TSH 2.39 10/14/2013       . Left arm numbness 04/30/2017  . MITRAL VALVE PROLAPSE 07/18/2007  . Mitral valve prolapse 07/18/2007   No dental prophylaxis   . 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 Pill esophagitis 2007  . PONV (postoperative nausea and vomiting)   . Stroke [facility])    denies   . Syncope and collapse 05/01/2017  . Transient ischemic attack (TIA) 04/30/2017   Patient Active Problem List   Diagnosis Date Noted  . Left arm numbness 04/30/2017    Priority: High  . Hyperlipidemia 12/03/2019    Priority: Medium  . Anxiety 10/10/2010    Priority: Medium  . Hypothyroidism 07/18/2007    Priority: Medium  . GERD 07/18/2007    Priority: Medium  . Syncope and collapse 05/01/2017    Priority: Low  . Dysphagia 02/24/2011    Priority: Low  . Mitral valve prolapse 07/18/2007    Priority: Low   Past Surgical History:  Procedure Laterality Date  . APPENDECTOMY    . BREAST LUMPECTOMY WITH RADIOACTIVE SEED LOCALIZATION Left 11/21/2019   Procedure: LEFT BREAST LUMPECTOMY WITH RADIOACTIVE SEED LOCALIZATION;  Surgeon: Griselda Miner, MD;  Location: [facility];  Service: General;  Laterality: Left;  . COLONOSCOPY  multiple   Normal  .  UPPER GASTROINTESTINAL ENDOSCOPY  02/08/2009   inflammatory nodule in antrum, 50 Fr Maloney dilation  . UPPER GASTROINTESTINAL ENDOSCOPY  2007x2   pill esophagitis/ulcer  . UPPER GASTROINTESTINAL ENDOSCOPY  2004   erosive esophagitis and mild esophageal stricture - dilated    Family History  Problem Relation Age of Onset  . Hypertension Brother   . Hypertension Father   . Colon cancer Neg Hx   . Breast cancer Neg Hx   . Stomach cancer Neg Hx   . Esophageal cancer Neg Hx   . Rectal cancer Neg Hx     Medications- reviewed and updated Current Outpatient Medications  Medication Sig Dispense Refill  . buPROPion (WELLBUTRIN XL) 300 MG 24 hr tablet Take 300 mg by mouth daily.    Marland Kitchen DEXILANT 60 MG capsule TAKE 1 CAPSULE(60 MG) BY MOUTH DAILY 90 capsule 0  . levothyroxine (SYNTHROID) 150 MCG tablet TAKE 1 TABLET(150 MCG) BY MOUTH DAILY 90 tablet 3  . zolpidem (AMBIEN) 10 MG tablet Take 10 mg by mouth at bedtime as needed.     No current facility-administered medications for this visit.    Allergies-reviewed and updated Allergies  Allergen Reactions  . Sulfamethoxazole     REACTION: hives  . Tetracycline Hcl     REACTION: ulcers in throat    Social History   Social History Narrative   Married. 2 children.  No grandkids. English Bulldog and boston terrier. grandpet-rescue dog.       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    Husband still works full time- plans for 10 more years in 2021      Hobbies: exercise- pilates, travel ([place] favorite spot)   Objective  Objective:  BP 100/70   Pulse 65   Temp 98.4 F (36.9 C)   Ht 5\' 6"  (1.676 m)   Wt 123 lb 6.4 oz (56 kg)   LMP 11/28/2012   SpO2 98%   BMI 19.92 kg/m  Gen: NAD, resting comfortably HEENT: Mucous membranes are moist. Oropharynx normal CV: RRR no murmurs rubs or gallops Lungs: CTAB no crackles, wheeze, rhonchi Abdomen:  soft/nontender/nondistended/normal bowel sounds. No rebound or guarding.  Ext: no edema Skin: warm, dry Neuro: grossly normal, moves all extremities, PERRLA   Assessment and Plan   59 y.o. female presenting for annual physical.  Health Maintenance counseling: 1. Anticipatory guidance: Patient counseled regarding regular dental exams q6 months, eye exams yearly ,  avoiding smoking and second hand smoke , limiting alcohol to 0 beverage per day on average- sparing glass of wine   2. Risk factor reduction:  Advised patient of need for regular exercise and diet rich and fruits and vegetables to reduce risk of heart attack and stroke. Exercise- walking and pilates. Diet- very healthy diet-I did discuss with patient as always do not want her to lose anymore weight Wt Readings from Last 3 Encounters:  12/03/19 123 lb 6.4 oz (56 kg)  11/21/19 123 lb 10.9 oz (56.1 kg)  10/18/18 122 lb (55.3 kg)  3. Immunizations/screenings/ancillary studies- discussed shingrix  Immunization History  Administered Date(s) Administered  . Influenza Whole 06/13/2019  . Influenza,inj,Quad PF,6+ Mos 05/01/2017  . Influenza-Unspecified 05/14/2013, 05/28/2014  . Moderna SARS-COVID-2 Vaccination 08/21/2019, 09/18/2019  . Tdap 03/17/2016  4. Cervical cancer screening- follows with Dr. 05/17/2016 pap in 2017 and 2020 or 2021- we are getting records.  5. Breast cancer screening-  mammogram - up to date- she is on 6 month schedule after recent biopsy/removal 6. Colon cancer screening - 12/2012 with 10 year repeat 7. Skin cancer screening- sees dermatology specialist Dr. 11-13-1979- no recent visit. advised regular sunscreen use. Denies worrisome, changing, or new skin lesions other than slight change on face and has appt on Friday.  8. Birth control/STD check- monogomous/postmenopausal 9. Osteoporosis screening at 66-postmenopausal and has family history it sounds like of osteopenia or osteoporosis in her father.  She also tends to be on  the thin side-we thought it would be reasonable to go ahead and update a bone density -Never smoker  Status of chronic or acute concerns   # breast lump/dystortion was benign in april 2021  #Hypothyroidism S: Compliant with levothyroxine 150 mcg Lab Results  Component Value Date   TSH 3.89 10/18/2018  A/P: Stable. Continue current medications.  Update tsh    #Insomnia/anxiety/stress S:Sees psychiatry and is on wellbutrin 300mg  XL. Also on Ambien 10 mg nightly as needed through them.  A/P: reasonable control and improving as pulling back at work   # GERD- Dr. 12/18/2018 S: follows with GI as well.  Compliant with Dexilant 60 mg and Carafate A/P:reasonable control- continue current meds  -check b12. Discussed asking Dr. if shed be a good candidate for TIF  #Mitral valve prolapse S: Noted on most recent echocardiogram in 04/2017. No palpitations or chest pain or shortness of breath  A/P: Stable. Continue to monitor   Recommended follow up: Return in about 1 year (around 12/02/2020) for physical or sooner if needed.  Lab/Order associations: coffee with creamer   ICD-10-CM   1. Preventative health care  Z00.00   2. Hypothyroidism, unspecified type  E03.9   3. Anxiety  F41.9   4. Gastroesophageal reflux disease without esophagitis  K21.9   5. Hyperlipidemia, unspecified hyperlipidemia type  E78.5   6. Postmenopausal  Z78.0   7. High risk medication use  Z79.899    Return precautions advised.  Garret Reddish, MD

## 2019-12-04 ENCOUNTER — Telehealth: Payer: Self-pay | Admitting: Internal Medicine

## 2019-12-04 LAB — CBC WITH DIFFERENTIAL/PLATELET
Basophils Absolute: 0.1 10*3/uL (ref 0.0–0.1)
Basophils Relative: 1.2 % (ref 0.0–3.0)
Eosinophils Absolute: 0.1 10*3/uL (ref 0.0–0.7)
Eosinophils Relative: 1.9 % (ref 0.0–5.0)
HCT: 35.2 % — ABNORMAL LOW (ref 36.0–46.0)
Hemoglobin: 11.3 g/dL — ABNORMAL LOW (ref 12.0–15.0)
Lymphocytes Relative: 26.9 % (ref 12.0–46.0)
Lymphs Abs: 1.4 10*3/uL (ref 0.7–4.0)
MCHC: 32 g/dL (ref 30.0–36.0)
MCV: 86.3 fl (ref 78.0–100.0)
Monocytes Absolute: 0.4 10*3/uL (ref 0.1–1.0)
Monocytes Relative: 7.8 % (ref 3.0–12.0)
Neutro Abs: 3.2 10*3/uL (ref 1.4–7.7)
Neutrophils Relative %: 62.2 % (ref 43.0–77.0)
Platelets: 301 10*3/uL (ref 150.0–400.0)
RBC: 4.08 Mil/uL (ref 3.87–5.11)
RDW: 15.8 % — ABNORMAL HIGH (ref 11.5–15.5)
WBC: 5.2 10*3/uL (ref 4.0–10.5)

## 2019-12-04 MED ORDER — DEXILANT 60 MG PO CPDR: 60.0000 mg | DELAYED_RELEASE_CAPSULE | Freq: Every day | ORAL | 0 refills | Status: DC

## 2019-12-04 NOTE — Telephone Encounter (Signed)
I have spoken to the patient and the pharmacy. They are trying to run it thru, if it kicks out I will need to do the prior authorization ASAP because she will be out of Dexilant on Sunday.  The pharmacy couldn't tell me , they said it could take up to 30 minutes to process.

## 2019-12-05 ENCOUNTER — Telehealth: Payer: Self-pay

## 2019-12-05 DIAGNOSIS — D2230 Melanocytic nevi of unspecified part of face: Secondary | ICD-10-CM | POA: Diagnosis not present

## 2019-12-05 NOTE — Telephone Encounter (Signed)
Patient returning call regarding lab results  ?

## 2019-12-05 NOTE — Telephone Encounter (Signed)
Spoke with the patient about her labs. She has been scheduled for her Vitamin B12 injection. Patient verbalized understanding her results. No questions or concerns at this time.

## 2019-12-05 NOTE — Telephone Encounter (Signed)
I spoke to the pharmacy and they said the Dexilant is currently out of stock at their [facility] but if she calls them they can try to locate it at another store. I called and left her this message.

## 2019-12-10 ENCOUNTER — Other Ambulatory Visit: Payer: Self-pay

## 2019-12-10 ENCOUNTER — Ambulatory Visit (INDEPENDENT_AMBULATORY_CARE_PROVIDER_SITE_OTHER): Payer: BC Managed Care – PPO

## 2019-12-10 DIAGNOSIS — E538 Deficiency of other specified B group vitamins: Secondary | ICD-10-CM

## 2019-12-10 MED ORDER — CYANOCOBALAMIN 1000 MCG/ML IJ SOLN
1000.0000 ug | Freq: Once | INTRAMUSCULAR | Status: AC
  Administered 2019-12-10: 1000 ug via INTRAMUSCULAR

## 2019-12-10 NOTE — Progress Notes (Signed)
I have reviewed and agree with note, evaluation, plan.   Cordarrell Sane, MD  

## 2019-12-10 NOTE — Progress Notes (Signed)
Patient came into the office to receive her vitamin B12 injection. Patient tolerated injection well. No other questions or concerns at this time.

## 2019-12-17 ENCOUNTER — Ambulatory Visit (INDEPENDENT_AMBULATORY_CARE_PROVIDER_SITE_OTHER): Payer: BC Managed Care – PPO | Admitting: *Deleted

## 2019-12-17 ENCOUNTER — Other Ambulatory Visit: Payer: Self-pay

## 2019-12-17 DIAGNOSIS — E538 Deficiency of other specified B group vitamins: Secondary | ICD-10-CM

## 2019-12-17 MED ORDER — CYANOCOBALAMIN 1000 MCG/ML IJ SOLN
1000.0000 ug | Freq: Once | INTRAMUSCULAR | Status: AC
  Administered 2019-12-17: 1000 ug via INTRAMUSCULAR

## 2019-12-17 NOTE — Progress Notes (Signed)
[name] Case is a 59 y.o. female presents to the office today for cyanocobalamin ((VITAMIN B-12)) injection 1,000 mcg  Given IM on Rt deltoid , per physician's orders. Original order 04/23/2021route) was administered Rt deltoid  today. Patient tolerated injection. B12 injection # 2 out of 4   Lenin Kuhnle M

## 2019-12-18 ENCOUNTER — Inpatient Hospital Stay: Admission: RE | Admit: 2019-12-18 | Payer: BC Managed Care – PPO | Source: Ambulatory Visit

## 2019-12-24 ENCOUNTER — Other Ambulatory Visit: Payer: Self-pay

## 2019-12-24 ENCOUNTER — Ambulatory Visit (INDEPENDENT_AMBULATORY_CARE_PROVIDER_SITE_OTHER): Payer: BC Managed Care – PPO

## 2019-12-24 DIAGNOSIS — E538 Deficiency of other specified B group vitamins: Secondary | ICD-10-CM

## 2019-12-24 MED ORDER — CYANOCOBALAMIN 1000 MCG/ML IJ SOLN
1000.0000 ug | Freq: Once | INTRAMUSCULAR | Status: AC
  Administered 2019-12-24: 1000 ug via INTRAMUSCULAR

## 2019-12-24 NOTE — Progress Notes (Signed)
Per orders of Dr.Hunter injection of Vitamin b12 1000 mcg given left deltoid  by Erick Alley.Patient tolerated injection well.

## 2019-12-31 ENCOUNTER — Ambulatory Visit (INDEPENDENT_AMBULATORY_CARE_PROVIDER_SITE_OTHER): Payer: BC Managed Care – PPO

## 2019-12-31 ENCOUNTER — Other Ambulatory Visit: Payer: Self-pay

## 2019-12-31 DIAGNOSIS — E538 Deficiency of other specified B group vitamins: Secondary | ICD-10-CM

## 2019-12-31 MED ORDER — CYANOCOBALAMIN 1000 MCG/ML IJ SOLN
1000.0000 ug | Freq: Once | INTRAMUSCULAR | Status: AC
  Administered 2019-12-31: 1000 ug via INTRAMUSCULAR

## 2019-12-31 NOTE — Progress Notes (Signed)
Per orders of Dr. Mardelle Matte , injection of B-12 given by Donnamarie Poag in right deltoid. Patient tolerated injection well. Patient will make appointment for 1 month.

## 2020-01-01 ENCOUNTER — Ambulatory Visit (INDEPENDENT_AMBULATORY_CARE_PROVIDER_SITE_OTHER): Payer: BC Managed Care – PPO | Admitting: Internal Medicine

## 2020-01-01 ENCOUNTER — Encounter: Payer: Self-pay | Admitting: Internal Medicine

## 2020-01-01 VITALS — BP 114/64 | HR 72 | Ht 66.0 in | Wt 122.2 lb

## 2020-01-01 DIAGNOSIS — K219 Gastro-esophageal reflux disease without esophagitis: Secondary | ICD-10-CM

## 2020-01-01 MED ORDER — FAMOTIDINE 40 MG PO TABS: 40.0000 mg | ORAL_TABLET | Freq: Every day | ORAL | 3 refills | Status: DC | PRN

## 2020-01-01 MED ORDER — DEXILANT 60 MG PO CPDR: 60.0000 mg | DELAYED_RELEASE_CAPSULE | Freq: Every day | ORAL | 3 refills | Status: DC

## 2020-01-01 NOTE — Patient Instructions (Addendum)
Glad things are going ok except for occasional issues.  We have sent the following medications to your pharmacy for you to pick up at your convenience: Dexilant  It is always ok to take an antacid or over the counter famotidine (Pepcid) for some extra help.  I appreciate the opportunity to care for you. Iva Boop, MD, [facility]

## 2020-01-01 NOTE — Progress Notes (Signed)
[name] Case 59 y.o. 28-Jul-1961 [id number]  Assessment & Plan:   Encounter Diagnosis  Name Primary?  . Gastroesophageal reflux disease, unspecified whether esophagitis present Yes     Doing well on Dexilant Will continue and may use Pepcid prn  RTC 2 mos sooner prn Subjective:   Chief Complaint: f/u GERD  HPI Overall doing well w/ occ heartburn, epigastric pain. No dysphagia. LAst seen 2 yrs ago Allergies  Allergen Reactions  . Sulfamethoxazole     REACTION: hives  . Tetracycline Hcl     REACTION: ulcers in stomach   Current Meds  Medication Sig  . buPROPion (WELLBUTRIN XL) 300 MG 24 hr tablet Take 300 mg by mouth daily.  . Cyanocobalamin (VITAMIN B-12 IJ) Inject 1,000 mcg as directed once a week.  Marland Kitchen dexlansoprazole (DEXILANT) 60 MG capsule Take 1 capsule (60 mg total) by mouth daily.  Marland Kitchen levothyroxine (SYNTHROID) 150 MCG tablet TAKE 1 TABLET(150 MCG) BY MOUTH DAILY  . zolpidem (AMBIEN) 10 MG tablet Take 10 mg by mouth at bedtime as needed.  . [DISCONTINUED] dexlansoprazole (DEXILANT) 60 MG capsule Take 1 capsule (60 mg total) by mouth daily.  . [DISCONTINUED] dexlansoprazole (DEXILANT) 60 MG capsule Take 1 capsule (60 mg total) by mouth daily.   Past Medical History:  Diagnosis Date  . Abnormal EKG   . Allergy   . Anemia   . Anxiety   . Arthritis   . Blood transfusion without reported diagnosis   . Depression   . Disorder of sebaceous glands    sebaceous neoplasm  . Dysphagia 02/24/2011   Esophageal stretching x a few   . GERD 07/18/2007  . Heart murmur   . HYPOTHYROIDISM 07/18/2007  . Hypothyroidism 07/18/2007   Levothyroxine Lab Results  Component Value Date   TSH 2.39 10/14/2013       . Left arm numbness 04/30/2017  . MITRAL VALVE PROLAPSE 07/18/2007  . Mitral valve prolapse 07/18/2007   No dental prophylaxis   . 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 Pill esophagitis 2007  . PONV (postoperative nausea and vomiting)   .  Stroke [facility])    denies   . Syncope and collapse 05/01/2017  . Transient ischemic attack (TIA) 04/30/2017   Past Surgical History:  Procedure Laterality Date  . APPENDECTOMY    . BREAST LUMPECTOMY WITH RADIOACTIVE SEED LOCALIZATION Left 11/21/2019   Procedure: LEFT BREAST LUMPECTOMY WITH RADIOACTIVE SEED LOCALIZATION;  Surgeon: Griselda Miner, MD;  Location: [facility];  Service: General;  Laterality: Left;  . COLONOSCOPY  multiple   Normal  . UPPER GASTROINTESTINAL ENDOSCOPY  02/08/2009   inflammatory nodule in antrum, 50 Fr Maloney dilation  . UPPER GASTROINTESTINAL ENDOSCOPY  2007x2   pill esophagitis/ulcer  . UPPER GASTROINTESTINAL ENDOSCOPY  2004   erosive esophagitis and mild esophageal stricture - dilated   Social History   Social History Narrative   Married. 2 children. No grandkids. English Bulldog and boston terrier. grandpet-rescue dog.       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    Husband still works full time- plans for 10 more years in 2021      Hobbies: exercise- pilates, travel ([place] favorite spot)   family history includes Hypertension in her brother and father.   Review of Systems As above  Objective:   Physical Exam BP 114/64 (BP Location: Left Arm, Patient Position: Sitting, Cuff Size: Normal)   Pulse 72  Ht 5\' 6"  (1.676 m) Comment: height measured without shoes  Wt 122 lb 4 oz (55.5 kg)   LMP 11/28/2012   BMI 19.73 kg/m

## 2020-01-02 ENCOUNTER — Encounter: Payer: Self-pay | Admitting: Family Medicine

## 2020-01-02 ENCOUNTER — Other Ambulatory Visit: Payer: Self-pay

## 2020-01-02 ENCOUNTER — Ambulatory Visit (INDEPENDENT_AMBULATORY_CARE_PROVIDER_SITE_OTHER)
Admission: RE | Admit: 2020-01-02 | Discharge: 2020-01-02 | Disposition: A | Discharge status: Home / Self Care | Payer: BC Managed Care – PPO | Source: Ambulatory Visit | Attending: Family Medicine | Admitting: Family Medicine

## 2020-01-02 DIAGNOSIS — Z780 Asymptomatic menopausal state: Secondary | ICD-10-CM

## 2020-01-02 DIAGNOSIS — Z0000 Encounter for general adult medical examination without abnormal findings: Secondary | ICD-10-CM

## 2020-01-02 DIAGNOSIS — M8580 Other specified disorders of bone density and structure, unspecified site: Secondary | ICD-10-CM | POA: Insufficient documentation

## 2020-01-28 ENCOUNTER — Ambulatory Visit: Payer: BC Managed Care – PPO

## 2020-01-29 DIAGNOSIS — F331 Major depressive disorder, recurrent, moderate: Secondary | ICD-10-CM | POA: Diagnosis not present

## 2020-01-29 DIAGNOSIS — M25551 Pain in right hip: Secondary | ICD-10-CM | POA: Diagnosis not present

## 2020-01-29 DIAGNOSIS — F411 Generalized anxiety disorder: Secondary | ICD-10-CM | POA: Diagnosis not present

## 2020-02-25 ENCOUNTER — Ambulatory Visit: Payer: BC Managed Care – PPO

## 2020-03-24 ENCOUNTER — Ambulatory Visit: Payer: BC Managed Care – PPO

## 2020-04-15 DIAGNOSIS — Z808 Family history of malignant neoplasm of other organs or systems: Secondary | ICD-10-CM | POA: Diagnosis not present

## 2020-04-15 DIAGNOSIS — L819 Disorder of pigmentation, unspecified: Secondary | ICD-10-CM | POA: Diagnosis not present

## 2020-04-15 DIAGNOSIS — L719 Rosacea, unspecified: Secondary | ICD-10-CM | POA: Diagnosis not present

## 2020-04-15 DIAGNOSIS — L818 Other specified disorders of pigmentation: Secondary | ICD-10-CM | POA: Diagnosis not present

## 2020-04-20 IMAGING — MG DIGITAL SCREENING BILAT W/ TOMO W/ CAD
8 series · 9 of 24 positions shown · non-contrast
Comparison: Previous exam(s).

CLINICAL DATA: Screening.

EXAM:
DIGITAL SCREENING BILATERAL MAMMOGRAM WITH TOMO AND CAD

[L MLO synth-2D]
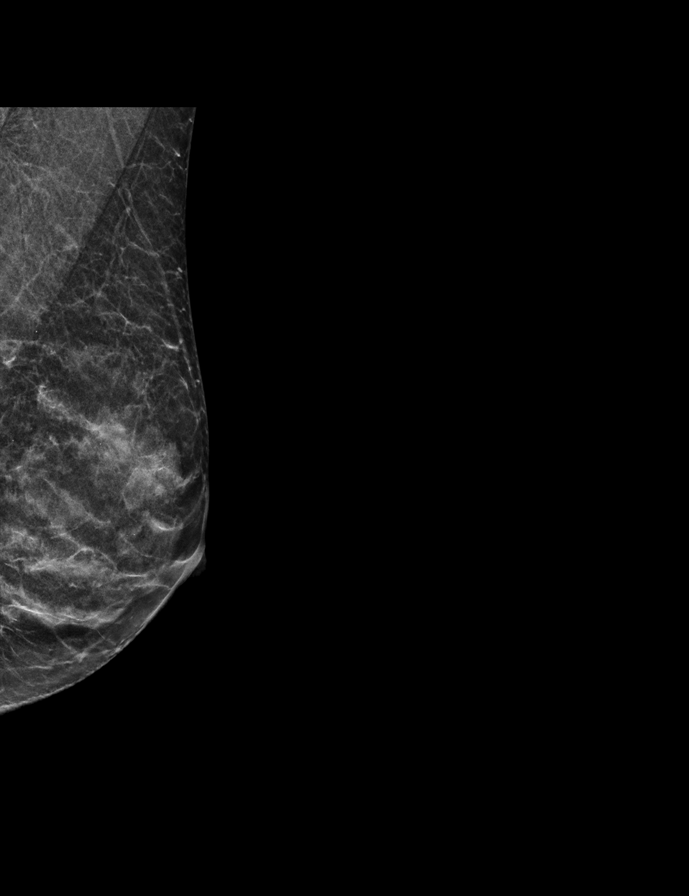

[L CC synth-2D]
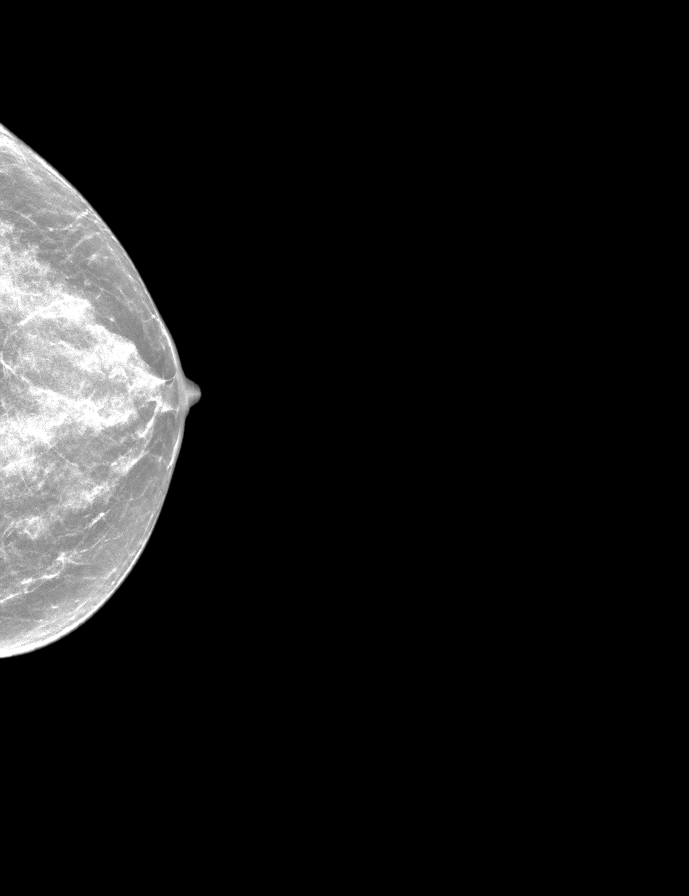

[R CC synth-2D]
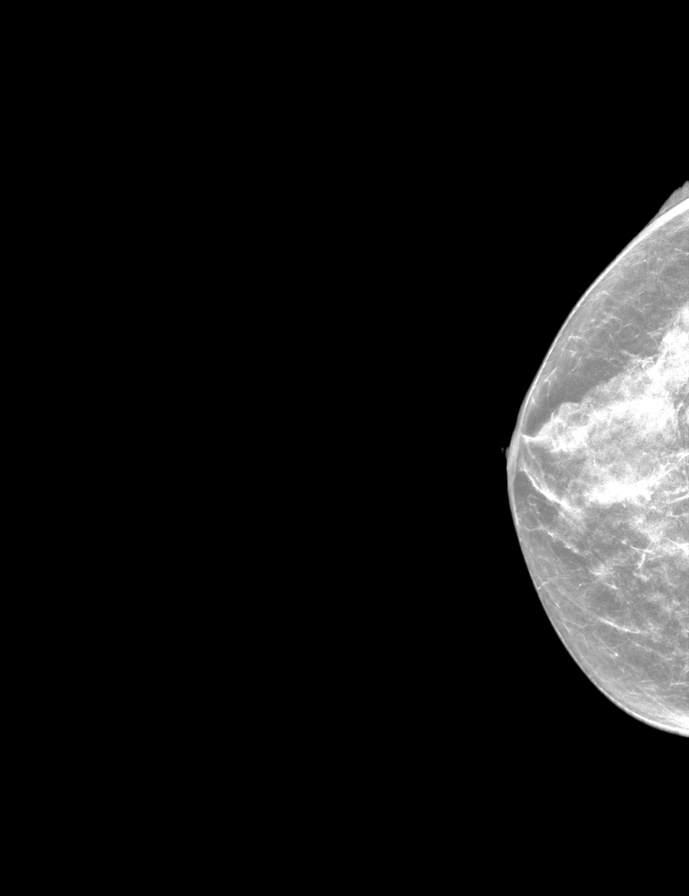

[R MLO synth-2D]
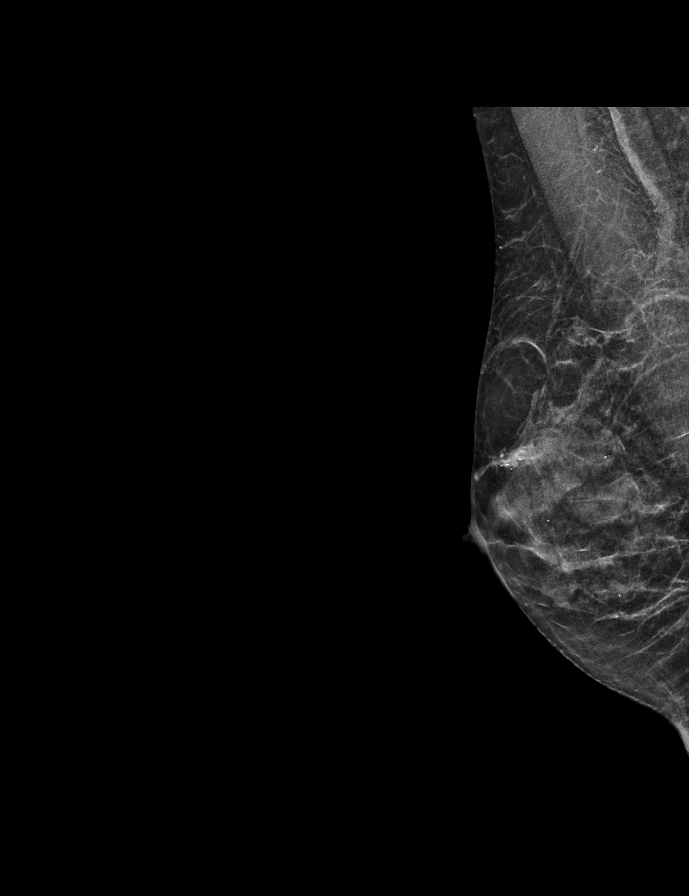

[R CC tomo · 2 of 45 frames shown]
[frame 15/45]
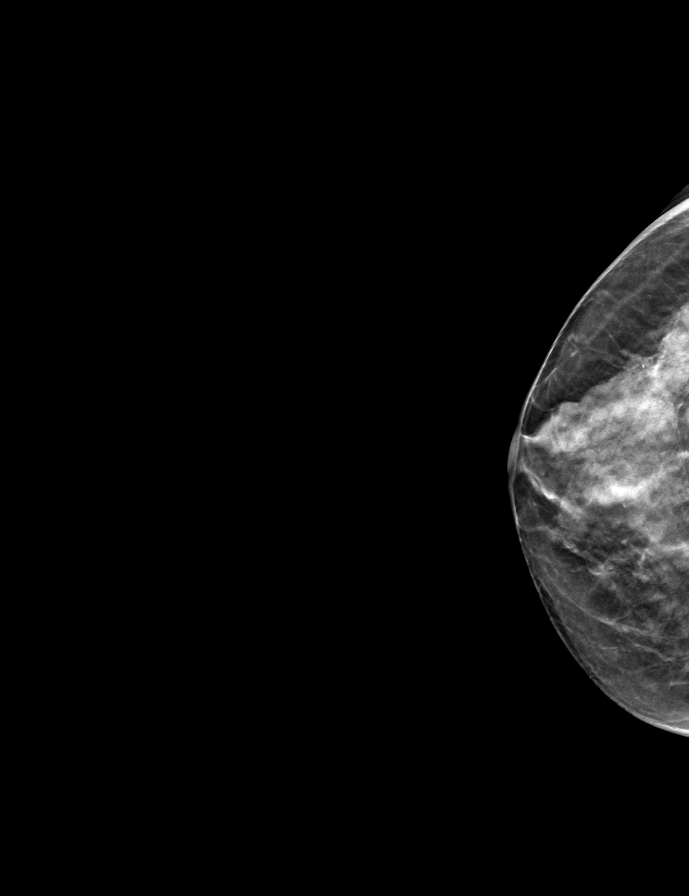
[frame 23/45]
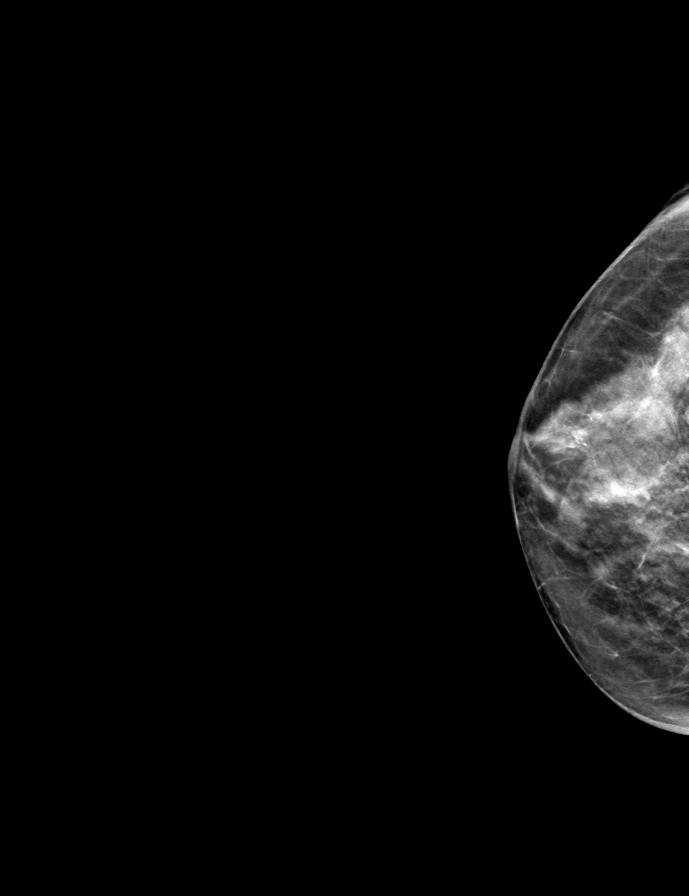

[L CC tomo · tomo slice 19/38.0]
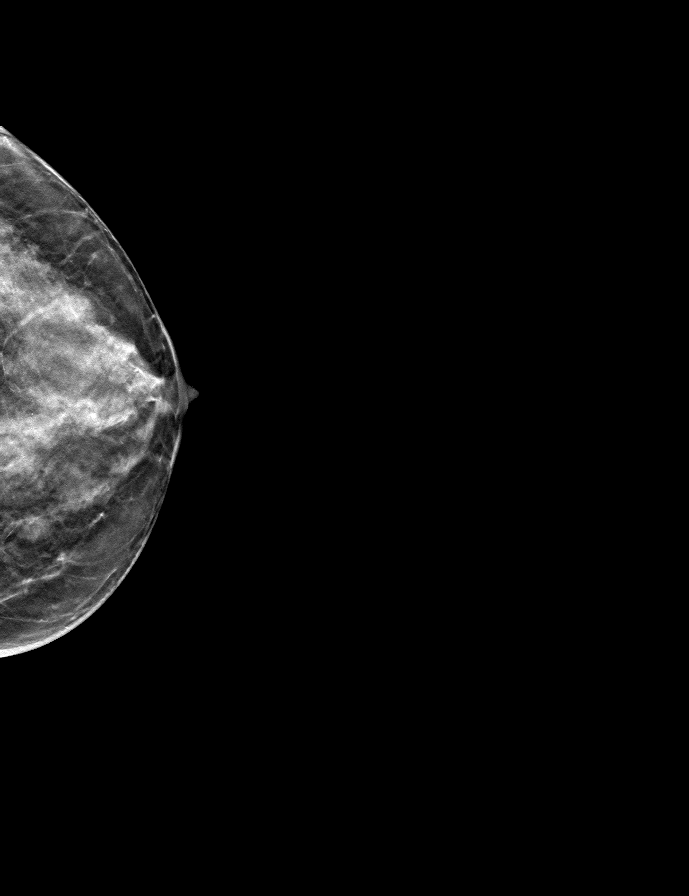

[L MLO tomo · tomo slice 20/39.0]
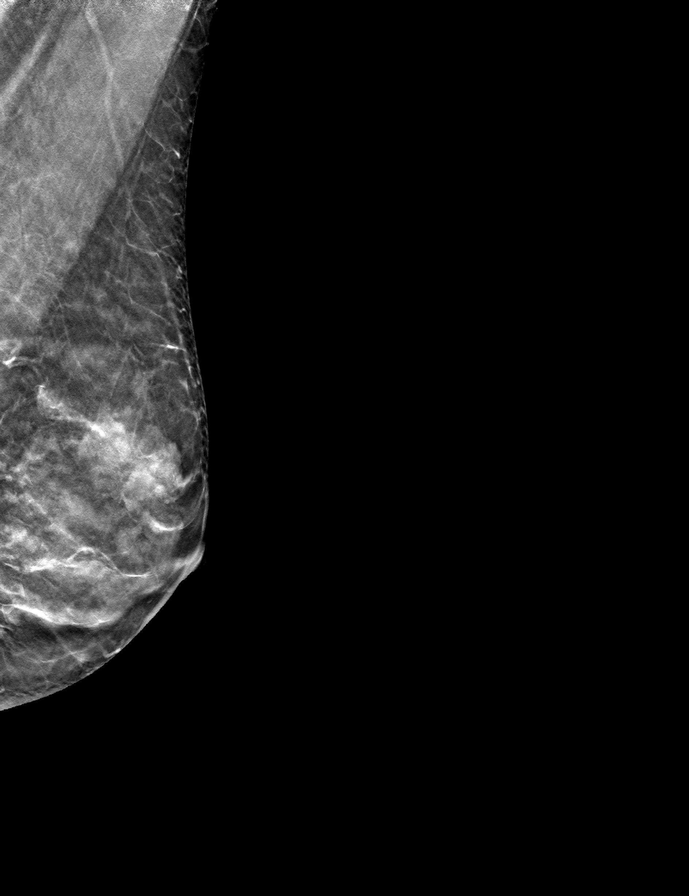

[R MLO tomo · tomo slice 21/42.0]
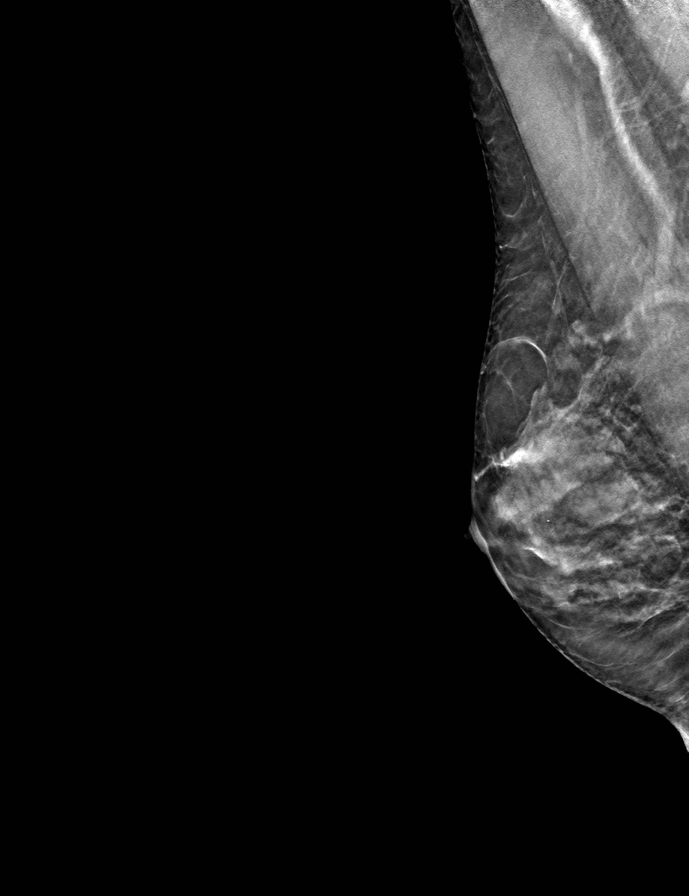

[9 of 24 positions shown; findings below may reference images not displayed]

ACR Breast Density Category c: The breast tissue is heterogeneously
dense, which may obscure small masses.
FINDINGS: In the left breast, possible distortion warrants further evaluation.
In the right breast, no findings suspicious for malignancy. Images
were processed with CAD.
IMPRESSION: Further evaluation is suggested for possible distortion in the left
breast.

RECOMMENDATION:
Diagnostic mammogram and possibly ultrasound of the left breast.
(Code:[id number])

The patient will be contacted regarding the findings, and additional
imaging will be scheduled.

BI-RADS CATEGORY  0: Incomplete. Need additional imaging evaluation
and/or prior mammograms for comparison.

## 2020-04-28 DIAGNOSIS — F331 Major depressive disorder, recurrent, moderate: Secondary | ICD-10-CM | POA: Diagnosis not present

## 2020-04-28 DIAGNOSIS — F411 Generalized anxiety disorder: Secondary | ICD-10-CM | POA: Diagnosis not present

## 2020-07-22 DIAGNOSIS — F331 Major depressive disorder, recurrent, moderate: Secondary | ICD-10-CM | POA: Diagnosis not present

## 2020-07-22 DIAGNOSIS — F411 Generalized anxiety disorder: Secondary | ICD-10-CM | POA: Diagnosis not present

## 2020-08-18 ENCOUNTER — Encounter: Payer: Self-pay | Admitting: Family Medicine

## 2020-08-18 ENCOUNTER — Ambulatory Visit (INDEPENDENT_AMBULATORY_CARE_PROVIDER_SITE_OTHER): Payer: BC Managed Care – PPO | Admitting: Family Medicine

## 2020-08-18 ENCOUNTER — Other Ambulatory Visit: Payer: Self-pay

## 2020-08-18 VITALS — BP 116/75 | HR 82 | Temp 97.2°F | Ht 66.0 in | Wt 123.4 lb

## 2020-08-18 DIAGNOSIS — R399 Unspecified symptoms and signs involving the genitourinary system: Secondary | ICD-10-CM

## 2020-08-18 LAB — POCT URINALYSIS DIPSTICK
Bilirubin, UA: NEGATIVE
Blood, UA: POSITIVE
Glucose, UA: NEGATIVE
Ketones, UA: NEGATIVE
Nitrite, UA: NEGATIVE
Protein, UA: NEGATIVE
Spec Grav, UA: 1.015 (ref 1.010–1.025)
Urobilinogen, UA: 0.2 E.U./dL
pH, UA: 6 (ref 5.0–8.0)

## 2020-08-18 MED ORDER — NITROFURANTOIN MONOHYD MACRO 100 MG PO CAPS
100.0000 mg | ORAL_CAPSULE | Freq: Two times a day (BID) | ORAL | 0 refills | Status: DC
Start: 2020-08-18 — End: 2020-12-03

## 2020-08-18 NOTE — Patient Instructions (Signed)
It was nice to see you!  You may have a urinary tract infection. Please start the antibiotic.  We will check a urine culture to make sure you do not have a resistant bacteria. We will call you if we need to change your medications.   Please make sure you are drinking plenty of fluids over the next few days.  If your symptoms do not improve over the next 5-7 days, or if they worsen, please let us know. Please also let us know if you have worsening back pain, fevers, chills, or body aches.   Take care, Dr Jimmey Ralph

## 2020-08-18 NOTE — Progress Notes (Signed)
   [name] Case is a 60 y.o. female who presents today for an office visit.  Assessment/Plan:  Dysuria No red flags.  UA equivocal.  Will start Macrobid while we await culture results.  Encourage good oral hydration.  Discussed reasons return to care.  Follow-up as needed.    Subjective:  HPI:  Symptoms started a month or two ago. Comes and goes.  He has pain with urination.  No frequency.  No urgency.  No fever or chills.  No back pain.       Objective:  Physical Exam: BP 116/75   Pulse 82   Temp (!) 97.2 F (36.2 C) (Temporal)   Ht 5\' 6"  (1.676 m)   Wt 123 lb 6.4 oz (56 kg)   LMP 11/28/2012   SpO2 99%   BMI 19.92 kg/m   Gen: No acute distress, resting comfortably CV: Regular rate and rhythm with no murmurs appreciated Pulm: Normal work of breathing, clear to auscultation bilaterally with no crackles, wheezes, or rhonchi Neuro: Grossly normal, moves all extremities Psych: Normal affect and thought content      Zykeem Bauserman M. 11/30/2012, MD 08/18/2020 11:18 AM

## 2020-08-19 LAB — URINE CULTURE
MICRO NUMBER:: 11384829
SPECIMEN QUALITY:: ADEQUATE

## 2020-08-23 NOTE — Progress Notes (Signed)
Please inform patient of the following:  Urine culture inconclusive. Would like for her to finish her antibiotics and let us know if not improving.  [name] Case. Jimmey Ralph, MD 08/23/2020 8:48 AM

## 2020-09-22 DIAGNOSIS — F331 Major depressive disorder, recurrent, moderate: Secondary | ICD-10-CM | POA: Diagnosis not present

## 2020-09-22 DIAGNOSIS — F411 Generalized anxiety disorder: Secondary | ICD-10-CM | POA: Diagnosis not present

## 2020-09-23 DIAGNOSIS — L110 Acquired keratosis follicularis: Secondary | ICD-10-CM | POA: Diagnosis not present

## 2020-09-23 DIAGNOSIS — D2239 Melanocytic nevi of other parts of face: Secondary | ICD-10-CM | POA: Diagnosis not present

## 2020-09-23 DIAGNOSIS — D485 Neoplasm of uncertain behavior of skin: Secondary | ICD-10-CM | POA: Diagnosis not present

## 2020-10-02 ENCOUNTER — Other Ambulatory Visit: Payer: Self-pay | Admitting: Family Medicine

## 2020-10-13 DIAGNOSIS — Z681 Body mass index (BMI) 19 or less, adult: Secondary | ICD-10-CM | POA: Diagnosis not present

## 2020-10-13 DIAGNOSIS — E538 Deficiency of other specified B group vitamins: Secondary | ICD-10-CM | POA: Insufficient documentation

## 2020-10-13 DIAGNOSIS — Z01419 Encounter for gynecological examination (general) (routine) without abnormal findings: Secondary | ICD-10-CM | POA: Diagnosis not present

## 2020-10-13 DIAGNOSIS — K219 Gastro-esophageal reflux disease without esophagitis: Secondary | ICD-10-CM | POA: Insufficient documentation

## 2020-11-04 ENCOUNTER — Other Ambulatory Visit: Payer: Self-pay | Admitting: Family Medicine

## 2020-11-04 DIAGNOSIS — Z9889 Other specified postprocedural states: Secondary | ICD-10-CM

## 2020-11-11 ENCOUNTER — Emergency Department (HOSPITAL_COMMUNITY): Payer: BC Managed Care – PPO

## 2020-11-11 ENCOUNTER — Other Ambulatory Visit: Payer: Self-pay

## 2020-11-11 ENCOUNTER — Emergency Department (HOSPITAL_COMMUNITY)
Admission: EM | Admit: 2020-11-11 | Discharge: 2020-11-11 | Disposition: A | Discharge status: Home / Self Care | Payer: BC Managed Care – PPO | Attending: Emergency Medicine | Admitting: Emergency Medicine

## 2020-11-11 ENCOUNTER — Encounter (HOSPITAL_COMMUNITY): Payer: Self-pay | Admitting: Emergency Medicine

## 2020-11-11 DIAGNOSIS — F1029 Alcohol dependence with unspecified alcohol-induced disorder: Secondary | ICD-10-CM | POA: Diagnosis not present

## 2020-11-11 DIAGNOSIS — R404 Transient alteration of awareness: Secondary | ICD-10-CM | POA: Diagnosis not present

## 2020-11-11 DIAGNOSIS — R4182 Altered mental status, unspecified: Secondary | ICD-10-CM | POA: Diagnosis not present

## 2020-11-11 DIAGNOSIS — F10929 Alcohol use, unspecified with intoxication, unspecified: Secondary | ICD-10-CM | POA: Diagnosis not present

## 2020-11-11 DIAGNOSIS — Z79899 Other long term (current) drug therapy: Secondary | ICD-10-CM | POA: Diagnosis not present

## 2020-11-11 DIAGNOSIS — E039 Hypothyroidism, unspecified: Secondary | ICD-10-CM | POA: Diagnosis not present

## 2020-11-11 LAB — CBC WITH DIFFERENTIAL/PLATELET
Abs Immature Granulocytes: 0.04 10*3/uL (ref 0.00–0.07)
Basophils Absolute: 0 10*3/uL (ref 0.0–0.1)
Basophils Relative: 1 %
Eosinophils Absolute: 0 10*3/uL (ref 0.0–0.5)
Eosinophils Relative: 0 %
HCT: 38.7 % (ref 36.0–46.0)
Hemoglobin: 12.5 g/dL (ref 12.0–15.0)
Immature Granulocytes: 1 %
Lymphocytes Relative: 16 %
Lymphs Abs: 1.1 10*3/uL (ref 0.7–4.0)
MCH: 28.3 pg (ref 26.0–34.0)
MCHC: 32.3 g/dL (ref 30.0–36.0)
MCV: 87.8 fL (ref 80.0–100.0)
Monocytes Absolute: 0.3 10*3/uL (ref 0.1–1.0)
Monocytes Relative: 5 %
Neutro Abs: 5.6 10*3/uL (ref 1.7–7.7)
Neutrophils Relative %: 77 %
Platelets: 300 10*3/uL (ref 150–400)
RBC: 4.41 MIL/uL (ref 3.87–5.11)
RDW: 14.1 % (ref 11.5–15.5)
WBC: 7.1 10*3/uL (ref 4.0–10.5)
nRBC: 0 % (ref 0.0–0.2)

## 2020-11-11 LAB — COMPREHENSIVE METABOLIC PANEL
ALT: 36 U/L (ref 0–44)
AST: 48 U/L — ABNORMAL HIGH (ref 15–41)
Albumin: 4.5 g/dL (ref 3.5–5.0)
Alkaline Phosphatase: 101 U/L (ref 38–126)
Anion gap: 13 (ref 5–15)
BUN: 12 mg/dL (ref 6–20)
CO2: 21 mmol/L — ABNORMAL LOW (ref 22–32)
Calcium: 9.3 mg/dL (ref 8.9–10.3)
Chloride: 103 mmol/L (ref 98–111)
Creatinine, Ser: 0.73 mg/dL (ref 0.44–1.00)
GFR, Estimated: >60 mL/min (ref >60)
Glucose, Bld: 101 mg/dL — ABNORMAL HIGH (ref 70–99)
Potassium: 3.9 mmol/L (ref 3.5–5.1)
Sodium: 137 mmol/L (ref 135–145)
Total Bilirubin: 0.7 mg/dL (ref 0.3–1.2)
Total Protein: 7.7 g/dL (ref 6.5–8.1)

## 2020-11-11 LAB — CBG MONITORING, ED: Glucose-Capillary: 96 mg/dL (ref 70–99)

## 2020-11-11 LAB — AMMONIA: Ammonia: 16 umol/L (ref 9–35)

## 2020-11-11 LAB — TSH: TSH: 2.81 u[IU]/mL (ref 0.350–4.500)

## 2020-11-11 LAB — ETHANOL: Alcohol, Ethyl (B): 157 mg/dL — ABNORMAL HIGH (ref <10)

## 2020-11-11 LAB — RAPID URINE DRUG SCREEN, HOSP PERFORMED
Amphetamines: NOT DETECTED
Barbiturates: NOT DETECTED
Benzodiazepines: NOT DETECTED
Cocaine: NOT DETECTED
Opiates: NOT DETECTED
Tetrahydrocannabinol: NOT DETECTED

## 2020-11-11 MED ORDER — SODIUM CHLORIDE 0.9 % IV SOLN
1000.0000 mL | INTRAVENOUS | Status: DC
  Administered 2020-11-11: 1000 mL via INTRAVENOUS

## 2020-11-11 MED ORDER — SODIUM CHLORIDE 0.9 % IV BOLUS (SEPSIS)
1000.0000 mL | Freq: Once | INTRAVENOUS | Status: AC
  Administered 2020-11-11: 1000 mL via INTRAVENOUS

## 2020-11-11 MED ORDER — ONDANSETRON HCL 4 MG/2ML IJ SOLN
4.0000 mg | Freq: Once | INTRAMUSCULAR | Status: AC
  Administered 2020-11-11: 4 mg via INTRAVENOUS
  Filled 2020-11-11: qty 2

## 2020-11-11 NOTE — ED Notes (Signed)
Pt cleaned and changed into a gown.

## 2020-11-11 NOTE — ED Triage Notes (Signed)
Pt arrived via EMS from home. Pt asked her husband to call 911 due to intoxication. Pt has been non verbal for EMS. Vitals stable.

## 2020-11-11 NOTE — ED Provider Notes (Signed)
[facility]-EMERGENCY DEPT Provider Note   CSN: [id number] Arrival date & time: 11/11/20  1946     History Chief Complaint  Patient presents with  . Alcohol Intoxication    [name] Case is a 60 y.o. female.  HPI   Patient presented to the ED for evaluation of altered mental status.  According to the EMS report the patient was home with her husband.  She had been drinking alcohol at a office party.  Patient apparently started to feel poorly and asked her husband to call 911.  Patient has not had any further history TMS.  In the ED, the patient will open her eyes and attempt to speak after vigorously shaking her but she will not provide any history for me.  History was provided by the patient's husband.  Husband states patient generally drinks a couple glasses of wine per night.  She does sometimes drink more when she goes out with friends.  Apparently she had an afternoon function at work.  She then went to another function where they were continuing to drink alcohol.  Patient was brought back home by her friends/coworkers.  Husband states initially she was not talking much but then she started dry heaving.  He then called 911.  Past Medical History:  Diagnosis Date  . Abnormal EKG   . Allergy   . Anemia   . Anxiety   . Arthritis   . Blood transfusion without reported diagnosis   . Depression   . Disorder of sebaceous glands    sebaceous neoplasm  . Dysphagia 02/24/2011   Esophageal stretching x a few   . GERD 07/18/2007  . Heart murmur   . HYPOTHYROIDISM 07/18/2007  . Hypothyroidism 07/18/2007   Levothyroxine Lab Results  Component Value Date   TSH 2.39 10/14/2013       . Left arm numbness 04/30/2017  . MITRAL VALVE PROLAPSE 07/18/2007  . Mitral valve prolapse 07/18/2007   No dental prophylaxis   . 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 Pill esophagitis 2007  . PONV (postoperative nausea and vomiting)   . Stroke [facility])    denies    . Syncope and collapse 05/01/2017  . Transient ischemic attack (TIA) 04/30/2017    Patient Active Problem List   Diagnosis Date Noted  . Osteopenia 01/02/2020  . Hyperlipidemia 12/03/2019  . Syncope and collapse 05/01/2017  . Left arm numbness 04/30/2017  . Dysphagia 02/24/2011  . Anxiety 10/10/2010  . Hypothyroidism 07/18/2007  . Mitral valve prolapse 07/18/2007  . GERD 07/18/2007    Past Surgical History:  Procedure Laterality Date  . APPENDECTOMY    . BREAST LUMPECTOMY WITH RADIOACTIVE SEED LOCALIZATION Left 11/21/2019   Procedure: LEFT BREAST LUMPECTOMY WITH RADIOACTIVE SEED LOCALIZATION;  Surgeon: Griselda Miner, MD;  Location: [facility];  Service: General;  Laterality: Left;  . COLONOSCOPY  multiple   Normal  . UPPER GASTROINTESTINAL ENDOSCOPY  02/08/2009   inflammatory nodule in antrum, 50 Fr Maloney dilation  . UPPER GASTROINTESTINAL ENDOSCOPY  2007x2   pill esophagitis/ulcer  . UPPER GASTROINTESTINAL ENDOSCOPY  2004   erosive esophagitis and mild esophageal stricture - dilated     OB History   No obstetric history on file.     Family History  Problem Relation Age of Onset  . Hypertension Brother   . Hypertension Father   . Colon cancer Neg Hx   . Breast cancer Neg Hx   . Stomach cancer Neg Hx   .  Esophageal cancer Neg Hx   . Rectal cancer Neg Hx     Social History   Tobacco Use  . Smoking status: Never Smoker  . Smokeless tobacco: Never Used  Vaping Use  . Vaping Use: Never used  Substance Use Topics  . Alcohol use: Yes    Alcohol/week: 3.0 standard drinks    Types: 3 Glasses of wine per week  . Drug use: No    Home Medications Prior to Admission medications   Medication Sig Start Date End Date Taking? Authorizing Provider  buPROPion (WELLBUTRIN XL) 300 MG 24 hr tablet Take 300 mg by mouth daily.    [provider]  Cyanocobalamin (VITAMIN B-12 IJ) Inject 1,000 mcg as directed once a week.    [provider]   dexlansoprazole (DEXILANT) 60 MG capsule Take 1 capsule (60 mg total) by mouth daily. 01/01/20   Iva BoopGessner, Carl E, MD  famotidine (PEPCID) 40 MG tablet Take 1 tablet (40 mg total) by mouth daily as needed for heartburn or indigestion. 01/01/20   Iva BoopGessner, Carl E, MD  levothyroxine (SYNTHROID) 150 MCG tablet TAKE 1 TABLET(150 MCG) BY MOUTH DAILY 10/04/20   Shelva MajesticHunter, Stephen O, MD  nitrofurantoin, macrocrystal-monohydrate, (MACROBID) 100 MG capsule Take 1 capsule (100 mg total) by mouth 2 (two) times daily. 08/18/20   Ardith DarkParker, Caleb M, MD  zolpidem (AMBIEN) 10 MG tablet Take 10 mg by mouth at bedtime as needed. 05/03/17   [provider]    Allergies    Sulfamethoxazole and Tetracycline hcl  Review of Systems   Review of Systems  All other systems reviewed and are negative.   Physical Exam Updated Vital Signs BP 126/79   Pulse 84   Temp (!) 97.5 F (36.4 C) (Oral)   Resp 18   Ht 1.676 m (5\' 6" )   Wt 56 kg   LMP 11/28/2012   SpO2 100%   BMI 19.93 kg/m   Physical Exam Vitals and nursing note reviewed.  Constitutional:      Appearance: She is well-developed. She is not diaphoretic.  HENT:     Head: Normocephalic and atraumatic.     Right Ear: External ear normal.     Left Ear: External ear normal.  Eyes:     General: No scleral icterus.       Right eye: No discharge.        Left eye: No discharge.     Conjunctiva/sclera: Conjunctivae normal.  Neck:     Trachea: No tracheal deviation.  Cardiovascular:     Rate and Rhythm: Normal rate and regular rhythm.  Pulmonary:     Effort: Pulmonary effort is normal. No respiratory distress.     Breath sounds: Normal breath sounds. No stridor. No wheezing or rales.  Abdominal:     General: Bowel sounds are normal. There is no distension.     Palpations: Abdomen is soft.     Tenderness: There is no abdominal tenderness. There is no guarding or rebound.  Musculoskeletal:        General: No tenderness.     Cervical back: Neck supple.   Skin:    General: Skin is warm and dry.     Findings: No rash.  Neurological:     Mental Status: She is lethargic.     GCS: GCS eye subscore is 2. GCS verbal subscore is 2. GCS motor subscore is 5.     Cranial Nerves: No cranial nerve deficit (no facial droop, extraocular movements intact, no slurred  speech).     Sensory: No sensory deficit.     Motor: No abnormal muscle tone or seizure activity.     Coordination: Coordination normal.     Comments: Patient is not following commands, will not lift her arms or legs off the bed, in the least responsive to loud verbal and physical stimuli     ED Results / Procedures / Treatments   Labs (all labs ordered are listed, but only abnormal results are displayed) Labs Reviewed  COMPREHENSIVE METABOLIC PANEL - Abnormal; Notable for the following components:      Result Value   CO2 21 (*)    Glucose, Bld 101 (*)    AST 48 (*)    All other components within normal limits  ETHANOL - Abnormal; Notable for the following components:   Alcohol, Ethyl (B) 157 (*)    All other components within normal limits  CBC WITH DIFFERENTIAL/PLATELET  AMMONIA  RAPID URINE DRUG SCREEN, HOSP PERFORMED  TSH  CBG MONITORING, ED    EKG None  Radiology CT HEAD WO CONTRAST  Result Date: 11/11/2020 CLINICAL DATA:  Mental status change. EXAM: CT HEAD WITHOUT CONTRAST TECHNIQUE: Contiguous axial images were obtained from the base of the skull through the vertex without intravenous contrast. COMPARISON:  None. FINDINGS: Brain: No evidence of acute infarction, hemorrhage, hydrocephalus, extra-axial collection or mass lesion/mass effect. Vascular: No hyperdense vessel or unexpected calcification. Skull: Normal. Negative for fracture or focal lesion. Sinuses/Orbits: No acute finding. Other: None. IMPRESSION: No acute intracranial abnormality. Electronically Signed   By: Ted Mcalpine M.D.   On: 11/11/2020 21:15    Procedures Procedures   Medications Ordered  in ED Medications  sodium chloride 0.9 % bolus 1,000 mL (0 mLs Intravenous Stopped 11/11/20 2131)    Followed by  0.9 %  sodium chloride infusion (1,000 mLs Intravenous New Bag/Given 11/11/20 2038)  ondansetron (ZOFRAN) injection 4 mg (4 mg Intravenous Given 11/11/20 2154)    ED Course  I have reviewed the triage vital signs and the nursing notes.  Pertinent labs & imaging results that were available during my care of the patient were reviewed by me and considered in my medical decision making (see chart for details).  Clinical Course as of 11/11/20 2216  Thu Nov 11, 2020  2146 CT scan without acute findings [JK]  2146 Metabolic panel unremarkable. [JK]  2213 Patient is now awake and alert.  She is able to carry on a normal conversation [JK]  2213 Patient's laboratory tests are otherwise unremarkable with the exception of her elevated alcohol level. [JK]    Clinical Course User Index [JK] Linwood Dibbles, MD   MDM Rules/Calculators/A&P                          Patient presented to the ED for evaluation of probable alcohol intoxication.  Patient was minimally responsive when she first arrived.  However, as the patient has been here she has returned back to her baseline.  She is completely alert and awake and is speaking clearly.  No slurred speech.  She is able to carry on a normal conversation.  Patient's urine drug screen is negative.  She denies any drug use.  At this point there is no signs of acute infection.  No acute CNS.  Patient's husband is here with her and she appears stable for discharge. Final Clinical Impression(s) / ED Diagnoses Final diagnoses:  Alcoholic intoxication with complication (HCC)  Rx / DC Orders ED Discharge Orders    None       Linwood Dibbles, MD 11/11/20 2216

## 2020-11-23 ENCOUNTER — Other Ambulatory Visit: Payer: Self-pay | Admitting: Family Medicine

## 2020-11-23 DIAGNOSIS — Z1231 Encounter for screening mammogram for malignant neoplasm of breast: Secondary | ICD-10-CM

## 2020-12-02 NOTE — Patient Instructions (Addendum)
Please stop by lab before you go If you have mychart- we will send your results within 3 business days of [place] receiving them.  If you do not have mychart- we will call you about results within 5 business days of [place] receiving them.  *please also note that you will see labs on mychart as soon as they post. I will later go in and write notes on them- will say "notes from Dr. Durene Cal"  Team please give her calcium in diet handout from up to date with goal calcium 1200 mg per day  I do want you to take [phone] units of vitamin D per day  Sign release of information at the check out desk for last pap smear   Recommended follow up: Return in about 1 year (around 12/03/2021) for physical or sooner if needed.

## 2020-12-02 NOTE — Progress Notes (Signed)
Phone [phone]   Subjective:  Patient presents today for their annual physical. Chief complaint-noted.   See problem oriented charting- ROS- full  review of systems was completed and negative per full ROS sheet patient completed  The following were reviewed and entered/updated in epic: Past Medical History:  Diagnosis Date  . Abnormal EKG   . Allergy   . Anemia   . Anxiety   . Arthritis   . Blood transfusion without reported diagnosis   . Depression   . Disorder of sebaceous glands    sebaceous neoplasm  . Dysphagia 02/24/2011   Esophageal stretching x a few   . GERD 07/18/2007  . Heart murmur   . HYPOTHYROIDISM 07/18/2007  . Hypothyroidism 07/18/2007   Levothyroxine Lab Results  Component Value Date   TSH 2.39 10/14/2013       . Left arm numbness 04/30/2017  . MITRAL VALVE PROLAPSE 07/18/2007  . Mitral valve prolapse 07/18/2007   No dental prophylaxis   . 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 Pill esophagitis 2007  . PONV (postoperative nausea and vomiting)   . Stroke [facility])    denies   . Syncope and collapse 05/01/2017  . Transient ischemic attack (TIA) 04/30/2017   Patient Active Problem List   Diagnosis Date Noted  . Left arm numbness 04/30/2017    Priority: High  . Osteopenia 01/02/2020    Priority: Medium  . Hyperlipidemia 12/03/2019    Priority: Medium  . Anxiety 10/10/2010    Priority: Medium  . Hypothyroidism 07/18/2007    Priority: Medium  . GERD 07/18/2007    Priority: Medium  . Syncope and collapse 05/01/2017    Priority: Low  . Dysphagia 02/24/2011    Priority: Low  . Mitral valve prolapse 07/18/2007    Priority: Low   Past Surgical History:  Procedure Laterality Date  . APPENDECTOMY    . BREAST LUMPECTOMY WITH RADIOACTIVE SEED LOCALIZATION Left 11/21/2019   Procedure: LEFT BREAST LUMPECTOMY WITH RADIOACTIVE SEED LOCALIZATION;  Surgeon: Griselda Miner, MD;  Location: [facility];  Service: General;  Laterality:  Left;  . COLONOSCOPY  multiple   Normal  . UPPER GASTROINTESTINAL ENDOSCOPY  02/08/2009   inflammatory nodule in antrum, 50 Fr Maloney dilation  . UPPER GASTROINTESTINAL ENDOSCOPY  2007x2   pill esophagitis/ulcer  . UPPER GASTROINTESTINAL ENDOSCOPY  2004   erosive esophagitis and mild esophageal stricture - dilated    Family History  Problem Relation Age of Onset  . Hypertension Brother   . Hypertension Father   . Colon cancer Neg Hx   . Breast cancer Neg Hx   . Stomach cancer Neg Hx   . Esophageal cancer Neg Hx   . Rectal cancer Neg Hx     Medications- reviewed and updated Current Outpatient Medications  Medication Sig Dispense Refill  . buPROPion (WELLBUTRIN XL) 300 MG 24 hr tablet Take 300 mg by mouth daily.    . Cyanocobalamin (VITAMIN B-12 IJ) Inject 1,000 mcg as directed once a week.    Marland Kitchen dexlansoprazole (DEXILANT) 60 MG capsule Take 1 capsule (60 mg total) by mouth daily. 90 capsule 3  . levothyroxine (SYNTHROID) 150 MCG tablet TAKE 1 TABLET(150 MCG) BY MOUTH DAILY 90 tablet 3  . zolpidem (AMBIEN) 10 MG tablet Take 10 mg by mouth at bedtime as needed.    . famotidine (PEPCID) 40 MG tablet Take 1 tablet (40 mg total) by mouth daily as needed for heartburn or indigestion. (Patient not taking: Reported on  12/03/2020) 90 tablet 3   No current facility-administered medications for this visit.    Allergies-reviewed and updated Allergies  Allergen Reactions  . Sulfamethoxazole     REACTION: hives  . Tetracycline Hcl     REACTION: ulcers in stomach    Social History   Social History Narrative   Married. 2 children. No grandkids. English Bulldog and boston terrier. grandpet-rescue dog.    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    Husband still works full time- plans for 10 more years in 2021      Hobbies: exercise- pilates, travel ([place] favorite spot)    Objective  Objective:  BP 104/82   Pulse 69   Temp 98.6 F (37 C) (Temporal)   Ht 5\' 6"  (1.676 m)   Wt 121 lb 6.4 oz (55.1 kg)   LMP 11/28/2012   SpO2 92%   BMI 19.59 kg/m  Gen: NAD, resting comfortably HEENT: Mucous membranes are moist. Oropharynx normal Neck: no thyromegaly CV: RRR no murmurs rubs or gallops Lungs: CTAB no crackles, wheeze, rhonchi Abdomen: soft/nontender/nondistended/normal bowel sounds. No rebound or guarding.  Ext: no edema Skin: warm, dry Neuro: grossly normal, moves all extremities, PERRLA   Assessment and Plan   60 y.o. female presenting for annual physical.  Health Maintenance counseling: 1. Anticipatory guidance: Patient counseled regarding regular dental exams -q6 months, eye exams - yearly,  avoiding smoking and second hand smoke , limiting alcohol to 1 beverage per day- glass of wine perhaps 5 days a week- did have recent episode with higher intake and no food on stomach with ER trip- doing much better now .   2. Risk factor reduction:  Advised patient of need for regular exercise and diet rich and fruits and vegetables to reduce risk of heart attack and stroke. Exercise- [place]- no recent pilates. Diet-very healthy diet still.  Wt Readings from Last 3 Encounters:  12/03/20 121 lb 6.4 oz (55.1 kg)  11/11/20 123 lb 7.3 oz (56 kg)  08/18/20 123 lb 6.4 oz (56 kg)  3. Immunizations/screenings/ancillary studies-discussed Shingrix- wants to do at a future visit, discussed COVID-19 booster- wants to hold off for now Immunization History  Administered Date(s) Administered  . Influenza Whole 06/13/2019  . Influenza,inj,Quad PF,6+ Mos 05/01/2017  . Influenza-Unspecified 05/14/2013, 05/28/2014, 05/14/2020  . Moderna Sars-Covid-2 Vaccination 08/21/2019, 09/18/2019, 06/17/2020  . Tdap 03/17/2016  4. Cervical cancer screening- follow-up with Dr. 05/17/2016 before she left [facility] with [facility]- Pap in 2022  but we did not get records-we will request again today 5. Breast cancer screening-  breast exam with GYN and mammogram 08/22/19 and scheduled in already 6. Colon cancer screening - May 2014 with 10-year repeat planned 7. Skin cancer screening-follows with Dr. June 2014. advised regular sunscreen use. Denies worrisome, changing, or new skin lesions.  8. Birth control/STD check- monogamous/postmenopausal 9. Osteoporosis screening at 59- worst T score last year -1.5 in right femoral neck-have discussed calcium through diet/vitamin D avoid weightbearing exercise -Never smoker  Status of chronic or acute concerns   #Hypothyroidism S: Compliant with levothyroxine 150 mcg Lab Results  Component Value Date   TSH 2.810 11/11/2020  A/P:  tsh recently checked- well controlled- continue current meds   #Insomnia/anxiety/stress S:Sees psychiatry and is on wellbutrin 300mg  XL. Also on Ambien 10 mg nightly as needed through them- occasional  if mind wont rest when lays down A/P: overall stable- continue psych follow up  #GERD-Dr. Leone Payor S: follows with GI as well.  Compliant with Dexilant 60 mg and Pepcid A/P:Stable. Continue current medications.    #Mitral valve prolapse S: Noted on most recent echocardiogram inSeptember 2018 A/P: stable/astympomatic- continue to monitor   # B12 deficiency S: Current treatment/medication (oral vs. IM): 1000 mcg daily  Lab Results  Component Value Date   VITAMINB12 168 (L) 12/03/2019  A/P: hopefully controlled- update b12 with labs- likely continue current meds. If happened to still be low would likely do short term injections   #hyperlipidemia S: Medication:none  Lab Results  Component Value Date   CHOL 257 (H) 12/03/2019   HDL 138.50 12/03/2019   LDLCALC 109 (H) 12/03/2019   LDLDIRECT 139.8 08/22/2013   TRIG 50.0 12/03/2019   CHOLHDL 2 12/03/2019   A/P: mildly high LDL but phenomenal HDL- do not plan on starting statin at present. Had coronary calcium score  of 0 on ct coronary morphology in 04/2017 which is very reassuring   Recommended follow up: Return in about 1 year (around 12/03/2021) for physical or sooner if needed. Future Appointments  Date Time Provider Department Center  01/27/2021  9:20 AM [facility] 3 [facility]   Lab/Order associations: NOT fasting- banana only    ICD-10-CM   1. Preventative health care  Z00.00 Vitamin B12    Comprehensive metabolic panel    Lipid panel  2. Hypothyroidism, unspecified type  E03.9   3. Hyperlipidemia, unspecified hyperlipidemia type  E78.5 Comprehensive metabolic panel    Lipid panel  4. Anxiety  F41.9   5. B12 deficiency  E53.8 Vitamin B12    No orders of the defined types were placed in this encounter.   Return precautions advised.  Tana Conch, MD

## 2020-12-03 ENCOUNTER — Other Ambulatory Visit: Payer: Self-pay

## 2020-12-03 ENCOUNTER — Encounter: Payer: Self-pay | Admitting: Family Medicine

## 2020-12-03 ENCOUNTER — Ambulatory Visit (INDEPENDENT_AMBULATORY_CARE_PROVIDER_SITE_OTHER): Payer: BC Managed Care – PPO | Admitting: Family Medicine

## 2020-12-03 VITALS — BP 104/82 | HR 69 | Temp 98.6°F | Ht 66.0 in | Wt 121.4 lb

## 2020-12-03 DIAGNOSIS — F419 Anxiety disorder, unspecified: Secondary | ICD-10-CM | POA: Diagnosis not present

## 2020-12-03 DIAGNOSIS — Z0000 Encounter for general adult medical examination without abnormal findings: Secondary | ICD-10-CM | POA: Diagnosis not present

## 2020-12-03 DIAGNOSIS — E785 Hyperlipidemia, unspecified: Secondary | ICD-10-CM | POA: Diagnosis not present

## 2020-12-03 DIAGNOSIS — E039 Hypothyroidism, unspecified: Secondary | ICD-10-CM

## 2020-12-03 DIAGNOSIS — E538 Deficiency of other specified B group vitamins: Secondary | ICD-10-CM

## 2020-12-03 LAB — COMPREHENSIVE METABOLIC PANEL
ALT: 33 U/L (ref 0–35)
AST: 37 U/L (ref 0–37)
Albumin: 4.4 g/dL (ref 3.5–5.2)
Alkaline Phosphatase: 119 U/L — ABNORMAL HIGH (ref 39–117)
BUN: 11 mg/dL (ref 6–23)
CO2: 31 mEq/L (ref 19–32)
Calcium: 9.6 mg/dL (ref 8.4–10.5)
Chloride: 102 mEq/L (ref 96–112)
Creatinine, Ser: 0.74 mg/dL (ref 0.40–1.20)
GFR: 89.11 mL/min (ref >60.00)
Glucose, Bld: 81 mg/dL (ref 70–99)
Potassium: 3.9 mEq/L (ref 3.5–5.1)
Sodium: 140 mEq/L (ref 135–145)
Total Bilirubin: 0.5 mg/dL (ref 0.2–1.2)
Total Protein: 6.9 g/dL (ref 6.0–8.3)

## 2020-12-03 LAB — LIPID PANEL
Cholesterol: 253 mg/dL — ABNORMAL HIGH (ref 0–200)
HDL: 140.3 mg/dL (ref >39.00)
LDL Cholesterol: 102 mg/dL — ABNORMAL HIGH (ref 0–99)
NonHDL: 112.69
Total CHOL/HDL Ratio: 2
Triglycerides: 51 mg/dL (ref 0.0–149.0)
VLDL: 10.2 mg/dL (ref 0.0–40.0)

## 2020-12-03 LAB — VITAMIN B12: Vitamin B-12: >1506 pg/mL — ABNORMAL HIGH (ref 211–911)

## 2020-12-05 ENCOUNTER — Other Ambulatory Visit: Payer: Self-pay | Admitting: Internal Medicine

## 2020-12-06 ENCOUNTER — Encounter (HOSPITAL_COMMUNITY): Payer: Self-pay

## 2020-12-09 ENCOUNTER — Other Ambulatory Visit: Payer: Self-pay

## 2020-12-22 DIAGNOSIS — F331 Major depressive disorder, recurrent, moderate: Secondary | ICD-10-CM | POA: Diagnosis not present

## 2020-12-22 DIAGNOSIS — F411 Generalized anxiety disorder: Secondary | ICD-10-CM | POA: Diagnosis not present

## 2020-12-25 ENCOUNTER — Other Ambulatory Visit: Payer: Self-pay | Admitting: Internal Medicine

## 2021-01-26 ENCOUNTER — Ambulatory Visit
Admission: RE | Admit: 2021-01-26 | Discharge: 2021-01-26 | Disposition: A | Discharge status: Home / Self Care | Payer: BC Managed Care – PPO | Source: Ambulatory Visit | Attending: Family Medicine | Admitting: Family Medicine

## 2021-01-26 ENCOUNTER — Other Ambulatory Visit: Payer: Self-pay

## 2021-01-26 DIAGNOSIS — Z1231 Encounter for screening mammogram for malignant neoplasm of breast: Secondary | ICD-10-CM | POA: Diagnosis not present

## 2021-01-27 ENCOUNTER — Ambulatory Visit: Payer: BC Managed Care – PPO

## 2021-03-30 DIAGNOSIS — L821 Other seborrheic keratosis: Secondary | ICD-10-CM | POA: Diagnosis not present

## 2021-03-30 DIAGNOSIS — D1801 Hemangioma of skin and subcutaneous tissue: Secondary | ICD-10-CM | POA: Diagnosis not present

## 2021-03-30 DIAGNOSIS — D225 Melanocytic nevi of trunk: Secondary | ICD-10-CM | POA: Diagnosis not present

## 2021-03-30 DIAGNOSIS — D485 Neoplasm of uncertain behavior of skin: Secondary | ICD-10-CM | POA: Diagnosis not present

## 2021-03-30 DIAGNOSIS — L570 Actinic keratosis: Secondary | ICD-10-CM | POA: Diagnosis not present

## 2021-03-30 DIAGNOSIS — L814 Other melanin hyperpigmentation: Secondary | ICD-10-CM | POA: Diagnosis not present

## 2021-04-06 DIAGNOSIS — F331 Major depressive disorder, recurrent, moderate: Secondary | ICD-10-CM | POA: Diagnosis not present

## 2021-04-06 DIAGNOSIS — F411 Generalized anxiety disorder: Secondary | ICD-10-CM | POA: Diagnosis not present

## 2021-05-25 DIAGNOSIS — L905 Scar conditions and fibrosis of skin: Secondary | ICD-10-CM | POA: Diagnosis not present

## 2021-06-30 DIAGNOSIS — F331 Major depressive disorder, recurrent, moderate: Secondary | ICD-10-CM | POA: Diagnosis not present

## 2021-06-30 DIAGNOSIS — F411 Generalized anxiety disorder: Secondary | ICD-10-CM | POA: Diagnosis not present

## 2021-07-25 ENCOUNTER — Telehealth: Payer: BC Managed Care – PPO | Admitting: Emergency Medicine

## 2021-07-25 DIAGNOSIS — B029 Zoster without complications: Secondary | ICD-10-CM | POA: Diagnosis not present

## 2021-07-25 MED ORDER — GABAPENTIN 300 MG PO CAPS
300.0000 mg | ORAL_CAPSULE | Freq: Two times a day (BID) | ORAL | 0 refills | Status: DC
Start: 2021-07-25 — End: 2021-11-04

## 2021-07-25 MED ORDER — VALACYCLOVIR HCL 1 G PO TABS
1000.0000 mg | ORAL_TABLET | Freq: Three times a day (TID) | ORAL | 0 refills | Status: AC
Start: 2021-07-25 — End: 2021-08-01

## 2021-07-25 NOTE — Progress Notes (Signed)
E-visit for Shingles   We are sorry that you are not feeling well. Here is how we plan to help!  Based on what you shared with me it looks like you have shingles.  Shingles or herpes zoster, is a common infection of the nerves.  It is a painful rash caused by the herpes zoster virus.  This is the same virus that causes chickenpox.  After a person has chickenpox, the virus remains inactive in the nerve cells.  Years later, the virus can become active again and travel to the skin.  It typically will appear on one side of the face or body.  Burning or shooting pain, tingling, or itching are early signs of the infection.  Blisters typically scab over in 7 to 10 days and clear up within 2-4 weeks. Shingles is only contagious to people that have never had the chickenpox, the chickenpox vaccine, or anyone who has a compromised immune system.  You should avoid contact with these type of people until your blisters scab over.  I have prescribed Valacyclovir 1g three times daily for 7 days and also Gabapentin 300mg  twice daily as needed for pain   HOME CARE: Apply ice packs (wrapped in a thin towel), cool compresses, or soak in cool bath to help reduce pain. Use calamine lotion to calm itchy skin. Avoid scratching the rash. Avoid direct sunlight.  GET HELP RIGHT AWAY IF: Symptoms that don't away after treatment. A rash or blisters near your eye. Increased drainage, fever, or rash after treatment. Severe pain that doesn't go away.   MAKE SURE YOU   Understand these instructions. Will watch your condition. Will get help right away if you are not doing well or get worse.  Thank you for choosing an e-visit.  Your e-visit answers were reviewed by a board certified advanced clinical practitioner to complete your personal care plan. Depending upon the condition, your plan could have included both over the counter or prescription medications.  Please review your pharmacy choice. Make sure the pharmacy  is open so you can pick up prescription now. If there is a problem, you may contact your provider through and have the prescription routed to another pharmacy.  Your safety is important to [employer]. If you have drug allergies check your prescription carefully.   For the next 24 hours you can use MyChart to ask questions about today's visit, request a non-urgent call back, or ask for a work or school excuse. You will get an email in the next two days asking about your experience. I hope that your e-visit has been valuable and will speed your recovery.  I have spent 5 minutes in review of e-visit questionnaire, review and updating patient chart, medical decision making and response to patient.   [place], PhD, FNP-BC

## 2021-09-15 ENCOUNTER — Telehealth: Payer: BC Managed Care – PPO | Admitting: Physician Assistant

## 2021-09-15 DIAGNOSIS — B029 Zoster without complications: Secondary | ICD-10-CM | POA: Diagnosis not present

## 2021-09-15 MED ORDER — VALACYCLOVIR HCL 1 G PO TABS: 1000.0000 mg | ORAL_TABLET | Freq: Three times a day (TID) | ORAL | 0 refills | Status: DC

## 2021-09-15 NOTE — Progress Notes (Signed)
I have spent 5 minutes in review of e-visit questionnaire, review and updating patient chart, medical decision making and response to patient.   Papa [place] Emari Demmer, PA-C    

## 2021-09-15 NOTE — Progress Notes (Signed)
E-visit for Shingles   We are sorry that you are not feeling well. Here is how we plan to help!  Based on what you shared with me it looks like you have shingles.  Shingles or herpes zoster, is a common infection of the nerves.  It is a painful rash caused by the herpes zoster virus.  This is the same virus that causes chickenpox.  After a person has chickenpox, the virus remains inactive in the nerve cells.  Years later, the virus can become active again and travel to the skin.  It typically will appear on one side of the face or body.  Burning or shooting pain, tingling, or itching are early signs of the infection.  Blisters typically scab over in 7 to 10 days and clear up within 2-4 weeks. Shingles is only contagious to people that have never had the chickenpox, the chickenpox vaccine, or anyone who has a compromised immune system.  You should avoid contact with these type of people until your blisters scab over.  I have prescribed Valacyclovir 1g three times daily for 7 days   HOME CARE: Apply ice packs (wrapped in a thin towel), cool compresses, or soak in cool bath to help reduce pain. Use calamine lotion to calm itchy skin. Avoid scratching the rash. Avoid direct sunlight.  GET HELP RIGHT AWAY IF: Symptoms that don't away after treatment. A rash or blisters near your eye. Increased drainage, fever, or rash after treatment. Severe pain that doesn't go away.   MAKE SURE YOU   Understand these instructions. Will watch your condition. Will get help right away if you are not doing well or get worse.  Thank you for choosing an e-visit.  Your e-visit answers were reviewed by a board certified advanced clinical practitioner to complete your personal care plan. Depending upon the condition, your plan could have included both over the counter or prescription medications.  Please review your pharmacy choice. Make sure the pharmacy is open so you can pick up prescription now. If there is  a problem, you may contact your provider through MyChart messaging and have the prescription routed to another pharmacy.  Your safety is important to us. If you have drug allergies check your prescription carefully.   For the next 24 hours you can use MyChart to ask questions about today's visit, request a non-urgent call back, or ask for a work or school excuse. You will get an email in the next two days asking about your experience. I hope that your e-visit has been valuable and will speed your recovery.  

## 2021-09-18 ENCOUNTER — Other Ambulatory Visit: Payer: Self-pay | Admitting: Family Medicine

## 2021-09-29 DIAGNOSIS — F411 Generalized anxiety disorder: Secondary | ICD-10-CM | POA: Diagnosis not present

## 2021-09-29 DIAGNOSIS — F331 Major depressive disorder, recurrent, moderate: Secondary | ICD-10-CM | POA: Diagnosis not present

## 2021-10-19 DIAGNOSIS — Z01419 Encounter for gynecological examination (general) (routine) without abnormal findings: Secondary | ICD-10-CM | POA: Diagnosis not present

## 2021-10-19 DIAGNOSIS — Z6820 Body mass index (BMI) 20.0-20.9, adult: Secondary | ICD-10-CM | POA: Diagnosis not present

## 2021-10-20 ENCOUNTER — Other Ambulatory Visit: Payer: Self-pay | Admitting: Family Medicine

## 2021-10-20 DIAGNOSIS — Z1231 Encounter for screening mammogram for malignant neoplasm of breast: Secondary | ICD-10-CM

## 2021-10-24 NOTE — Progress Notes (Incomplete)
Phone [phone]   Subjective:  Patient presents today for their annual physical. Chief complaint-noted.   See problem oriented charting- ROS- full  review of systems was completed and negative except for: ***  The following were reviewed and entered/updated in epic: Past Medical History:  Diagnosis Date   Abnormal EKG    Allergy    Anemia    Anxiety    Arthritis    Blood transfusion without reported diagnosis    Depression    Disorder of sebaceous glands    sebaceous neoplasm   Dysphagia 02/24/2011   Esophageal stretching x a few    GERD 07/18/2007   Heart murmur    HYPOTHYROIDISM 07/18/2007   Hypothyroidism 07/18/2007   Levothyroxine 155mcg Lab Results  Component Value Date   TSH 2.39 10/14/2013        Left arm numbness 04/30/2017   MITRAL VALVE PROLAPSE 07/18/2007   Mitral valve prolapse 07/18/2007   No dental prophylaxis    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   Pill esophagitis 2007   PONV (postoperative nausea and vomiting)    Stroke ([place])    denies    Syncope and collapse 05/01/2017   Transient ischemic attack (TIA) 04/30/2017   Patient Active Problem List   Diagnosis Date Noted   Osteopenia 01/02/2020   Hyperlipidemia 12/03/2019   Syncope and collapse 05/01/2017   Left arm numbness 04/30/2017   Dysphagia 02/24/2011   Anxiety 10/10/2010   Hypothyroidism 07/18/2007   Mitral valve prolapse 07/18/2007   GERD 07/18/2007   Past Surgical History:  Procedure Laterality Date   APPENDECTOMY     BREAST BIOPSY Left 11/21/2019   benign   BREAST LUMPECTOMY WITH RADIOACTIVE SEED LOCALIZATION Left 11/21/2019   Procedure: LEFT BREAST LUMPECTOMY WITH RADIOACTIVE SEED LOCALIZATION;  Surgeon: Jovita Kussmaul, MD;  Location: [facility];  Service: General;  Laterality: Left;   COLONOSCOPY  multiple   Normal   UPPER GASTROINTESTINAL ENDOSCOPY  02/08/2009   inflammatory nodule in antrum, 50 Fr Maloney dilation   UPPER GASTROINTESTINAL ENDOSCOPY   2007x2   pill esophagitis/ulcer   UPPER GASTROINTESTINAL ENDOSCOPY  08/14/2002   erosive esophagitis and mild esophageal stricture - dilated    Family History  Problem Relation Age of Onset   Hypertension Brother    Hypertension Father    Colon cancer Neg Hx    Breast cancer Neg Hx    Stomach cancer Neg Hx    Esophageal cancer Neg Hx    Rectal cancer Neg Hx     Medications- reviewed and updated Current Outpatient Medications  Medication Sig Dispense Refill   buPROPion (WELLBUTRIN XL) 300 MG 24 hr tablet Take 300 mg by mouth daily.     Cyanocobalamin (VITAMIN B-12 IJ) Inject 1,000 mcg as directed once a week.     DEXILANT 60 MG capsule TAKE 1 CAPSULE(60 MG) BY MOUTH DAILY 90 capsule 3   famotidine (PEPCID) 40 MG tablet TAKE 1 TABLET(40 MG) BY MOUTH DAILY AS NEEDED FOR HEARTBURN OR INDIGESTION 90 tablet 3   gabapentin (NEURONTIN) 300 MG capsule Take 1 capsule (300 mg total) by mouth 2 (two) times daily. 30 capsule 0   levothyroxine (SYNTHROID) 150 MCG tablet TAKE ONE TABLET BY MOUTH DAILY 90 tablet 3   zolpidem (AMBIEN) 10 MG tablet Take 10 mg by mouth at bedtime as needed.     No current facility-administered medications for this visit.    Allergies-reviewed and updated Allergies  Allergen Reactions   Sulfamethoxazole  REACTION: hives   Tetracycline Hcl     REACTION: ulcers in stomach    Social History   Social History Narrative   Married. 2 children. No grandkids. English Bulldog and boston terrier. grandpet-rescue dog.    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    Husband still works full time- plans for 10 more years in 2021      Hobbies: exercise- pilates, travel ([place] favorite spot)   Objective  Objective:  LMP 11/28/2012  Gen: NAD, resting comfortably HEENT: Mucous membranes are moist. Oropharynx normal Neck: no thyromegaly CV: RRR no  murmurs rubs or gallops Lungs: CTAB no crackles, wheeze, rhonchi Abdomen: soft/nontender/nondistended/normal bowel sounds. No rebound or guarding.  Ext: no edema Skin: warm, dry Neuro: grossly normal, moves all extremities, PERRLA***   Assessment and Plan   61 y.o. female presenting for annual physical.  Health Maintenance counseling: 1. Anticipatory guidance: Patient counseled regarding regular dental exams ***q6 months, eye exams -yearly***,  avoiding smoking and second hand smoke*** , limiting alcohol to 1 beverage per day-glass of wine perhaps 5 days a week- did have recent episode with higher intake and no food on stomach with ER trip- doing much better now .  *** .  No illicit drugs - *** 2. Risk factor reduction:  Advised patient of need for regular exercise and diet rich and fruits and vegetables to reduce risk of heart attack and stroke.  Exercise[place]- no recent pilates. ***.  Diet-very healthy diet still.***.  Wt Readings from Last 3 Encounters:  12/03/20 121 lb 6.4 oz (55.1 kg)  11/11/20 123 lb 7.3 oz (56 kg)  08/18/20 123 lb 6.4 oz (56 kg)   3. Immunizations/screenings/ancillary studies DISCUSSED:  -Shingrix vaccination - *** -COVID booster vaccination #4- *** -Flu vaccination - *** Immunization History  Administered Date(s) Administered   Influenza Whole 06/13/2019   Influenza,inj,Quad PF,6+ Mos 05/01/2017   Influenza-Unspecified 05/14/2013, 05/28/2014, 05/14/2020   Moderna Sars-Covid-2 Vaccination 08/21/2019, 09/18/2019, 06/17/2020   Tdap 03/17/2016   Health Maintenance Due  Topic Date Due   Zoster Vaccines- Shingrix (1 of 2) Never done   COVID-19 Vaccine (4 - Booster for Moderna series) 08/12/2020   INFLUENZA VACCINE  03/14/2021   4. Cervical cancer screening- 10/13/2020 with 3 year repeat planned*** 5. Breast cancer screening-  breast exam with GYN*** and mammogram 01/26/21 with 1 year repeat planned 6. Colon  cancer screening - 12/27/12 with 10 year repeat planned *** 7. Skin cancer screening- -follows with Dr. Delman Cheadle. ***advised regular sunscreen use. Denies worrisome, changing, or new skin lesions.  8. Birth control/STD check- monogamous/postmenopausal*** 9. Osteoporosis screening at 36-  worst T score last year -1.5 in right femoral neck-have discussed calcium through diet/vitamin D avoid weightbearing exercise*** -never smoker - ***  Status of chronic or acute concerns   ***breast lump was benign in april 2021  #Hypothyroidism S: Compliant with levothyroxine 150 mcg Lab Results  Component Value Date   TSH 2.810 11/11/2020    A/P:***   #Insomnia/anxiety/stress S:Sees psychiatry and is on wellbutrin 300mg  XL. Also on Ambien 10 mg nightly as needed through them.  A/P: ***   #GERD S: follows with GI as well. Compliant with Dexilant 60 mg and Carafate B12 levels related to PPI use: Lab Results  Component Value Date   VITAMINB12 >1506 (H) 12/03/2020   A/P: ***   #  Mitral valve prolapse S: Noted on most recent echocardiogram in***  A/P: ***   Recommended follow up: No follow-ups on file. Future Appointments  Date Time Provider [place]  12/08/2021 10:40 AM Marin Olp, MD [facility]  01/27/2022  9:00 AM [facility] 3 [facility]    No chief complaint on file.  Lab/Order associations:*** fasting No diagnosis found.  No orders of the defined types were placed in this encounter.   Return precautions advised.  Burnett Corrente

## 2021-11-03 ENCOUNTER — Telehealth: Payer: BC Managed Care – PPO | Admitting: Family Medicine

## 2021-11-03 DIAGNOSIS — B029 Zoster without complications: Secondary | ICD-10-CM

## 2021-11-03 NOTE — Progress Notes (Signed)
.  Based on what you shared with me it looks like you have shingles,that should be evaluated in a face to face office visit. Spoke with patient on phone. She says she has had shingles 2x on left buttoks rea. One spot never completely heals. Same sot is red and itchy today.  I told her shingles usually doe snot work that way. Needs a face to fac visit to have area looked at and to swab it to make sure she is receiving propertreatment. She understood. ? ?NOTE: There will be NO CHARGE for this eVisit ?  ?If you are having a true medical emergency please call 911.   ?  ? For an urgent face to face visit, [facility] has six urgent care centers for your convenience:  ?  ? [facility] at [facility] ?Get Driving Directions ?[phone] ?[phone] ?[place], [place] [address] ?  ? [facility]) ?Get Driving Directions ?[phone] ?[address] ?[place], [place] [address] ? ?[facility]) ?Get Driving Directions ?[phone] ?[address] ?[place],  [place]  [address] ? ?[facility] ?Get Driving Directions ?[phone] ?[address] ?[place], [place] [address] ?  ?[facility] [place] ?Get Driving Directions  ?[phone] ?[address] ?[address] ?[place], [place] [address] ?  ?[facility] ?Get Driving Directions ?[phone] ?[address] ?[place], [place] [address] ? ?Your MyChart E-visit questionnaire answers were reviewed by a board certified advanced clinical practitioner to complete your personal care plan based on your specific symptoms.  Thank you for using e-Visits. ?  ? ?

## 2021-11-04 ENCOUNTER — Encounter: Payer: Self-pay | Admitting: Family

## 2021-11-04 ENCOUNTER — Ambulatory Visit (INDEPENDENT_AMBULATORY_CARE_PROVIDER_SITE_OTHER): Payer: BC Managed Care – PPO | Admitting: Family

## 2021-11-04 VITALS — BP 120/73 | HR 62 | Temp 98.5°F | Ht 66.0 in | Wt 121.2 lb

## 2021-11-04 DIAGNOSIS — R21 Rash and other nonspecific skin eruption: Secondary | ICD-10-CM | POA: Diagnosis not present

## 2021-11-04 DIAGNOSIS — B029 Zoster without complications: Secondary | ICD-10-CM | POA: Diagnosis not present

## 2021-11-04 MED ORDER — TRIAMCINOLONE ACETONIDE 0.1 % EX CREA: 1.0000 "application " | TOPICAL_CREAM | Freq: Two times a day (BID) | CUTANEOUS | 0 refills | Status: DC

## 2021-11-04 MED ORDER — VALACYCLOVIR HCL 1 G PO TABS: 1000.0000 mg | ORAL_TABLET | Freq: Three times a day (TID) | ORAL | 0 refills | Status: AC

## 2021-11-04 NOTE — Progress Notes (Signed)
? ?Subjective:  ? ? ? Patient ID: [name] Case, female    DOB: Jan 15, 1961, 61 y.o.   MRN: [id number] ? ?Chief Complaint  ?Patient presents with  ? Rash  ?  started yesterday, left upper buttock  ? ?HPI: ?Rash: Patient complains of a rash on left upper buttock and a separate rash on upper back. Symptoms began 1 day ago. Patient describes the rash as erythematous, vesicles; separate rash on upper back 2-3 pinpoint pink, scaly spots that are pruritic. Characteristics of rash and associated history: Similar rash in the past? yes, Is rash pruritic?  mildly, Is rash painful?  buttock - not yet, no tingling. Patient's previous dermatologic history includes  similar Herpes zoster rash outbreaks . Medications currently using: topical steroid: hydrocortisone on back lesions . Environmental exposures or allergies: none  ? ? ?Health Maintenance Due  ?Topic Date Due  ? Zoster Vaccines- Shingrix (1 of 2) Never done  ? COVID-19 Vaccine (4 - Booster for Moderna series) 08/12/2020  ? INFLUENZA VACCINE  03/14/2021  ? ?Past Medical History:  ?Diagnosis Date  ? Abnormal EKG   ? Allergy   ? Anemia   ? Anxiety   ? Arthritis   ? Blood transfusion without reported diagnosis   ? Depression   ? Disorder of sebaceous glands   ? sebaceous neoplasm  ? Dysphagia 02/24/2011  ? Esophageal stretching x a few   ? GERD 07/18/2007  ? Heart murmur   ? HYPOTHYROIDISM 07/18/2007  ? Hypothyroidism 07/18/2007  ? Levothyroxine 188mcg Lab Results  Component Value Date   TSH 2.39 10/14/2013       ? Left arm numbness 04/30/2017  ? MITRAL VALVE PROLAPSE 07/18/2007  ? Mitral valve prolapse 07/18/2007  ? No dental prophylaxis   ? 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 Pill esophagitis 2007  ? PONV (postoperative nausea and vomiting)   ? Stroke [facility])   ? denies   ? Syncope and collapse 05/01/2017  ? Transient ischemic attack (TIA) 04/30/2017  ? ? ?Past Surgical History:  ?Procedure Laterality Date  ? APPENDECTOMY    ? BREAST BIOPSY Left 11/21/2019  ?  benign  ? BREAST LUMPECTOMY WITH RADIOACTIVE SEED LOCALIZATION Left 11/21/2019  ? Procedure: LEFT BREAST LUMPECTOMY WITH RADIOACTIVE SEED LOCALIZATION;  Surgeon: Jovita Kussmaul, MD;  Location: [facility];  Service: General;  Laterality: Left;  ? COLONOSCOPY  multiple  ? Normal  ? UPPER GASTROINTESTINAL ENDOSCOPY  02/08/2009  ? inflammatory nodule in antrum, 50 Fr Maloney dilation  ? UPPER GASTROINTESTINAL ENDOSCOPY  2007x2  ? pill esophagitis/ulcer  ? UPPER GASTROINTESTINAL ENDOSCOPY  08/14/2002  ? erosive esophagitis and mild esophageal stricture - dilated  ? ? ?Outpatient Medications Prior to Visit  ?Medication Sig Dispense Refill  ? buPROPion (WELLBUTRIN XL) 300 MG 24 hr tablet Take 300 mg by mouth daily.    ? cyanocobalamin 1000 MCG tablet Take 1,000 mcg by mouth daily.    ? DEXILANT 60 MG capsule TAKE 1 CAPSULE(60 MG) BY MOUTH DAILY 90 capsule 3  ? famotidine (PEPCID) 40 MG tablet TAKE 1 TABLET(40 MG) BY MOUTH DAILY AS NEEDED FOR HEARTBURN OR INDIGESTION 90 tablet 3  ? levothyroxine (SYNTHROID) 150 MCG tablet TAKE ONE TABLET BY MOUTH DAILY 90 tablet 3  ? zolpidem (AMBIEN) 10 MG tablet Take 10 mg by mouth at bedtime as needed.    ? Cyanocobalamin (VITAMIN B-12 IJ) Inject 1,000 mcg as directed once a week.    ? gabapentin (NEURONTIN) 300 MG capsule  Take 1 capsule (300 mg total) by mouth 2 (two) times daily. 30 capsule 0  ? ?No facility-administered medications prior to visit.  ? ? ?Allergies  ?Allergen Reactions  ? Sulfamethoxazole   ?  REACTION: hives  ? Tetracycline Hcl   ?  REACTION: ulcers in stomach  ? ? ? ?   ?Objective:  ?  ?Physical Exam ?Vitals and nursing note reviewed.  ?Constitutional:   ?   Appearance: Normal appearance.  ?Cardiovascular:  ?   Rate and Rhythm: Normal rate and regular rhythm.  ?Pulmonary:  ?   Effort: Pulmonary effort is normal.  ?   Breath sounds: Normal breath sounds.  ?Musculoskeletal:     ?   General: Normal range of motion.  ?Skin: ?   General: Skin is warm and  dry.  ?   Findings: Rash present. Rash is vesicular (1 lesion, closed, firm, pink, approx. 2cm in diameter, non-tender).  ? ?    ?Neurological:  ?   Mental Status: She is alert.  ?Psychiatric:     ?   Mood and Affect: Mood normal.     ?   Behavior: Behavior normal.  ? ? ?BP 120/73 (BP Location: Left Arm, Patient Position: Sitting, Cuff Size: Normal)   Pulse 62   Temp 98.5 ?F (36.9 ?C) (Temporal)   Ht 5\' 6"  (1.676 m)   Wt 121 lb 4 oz (55 kg)   LMP 11/28/2012   SpO2 100%   BMI 19.57 kg/m?  ?Wt Readings from Last 3 Encounters:  ?11/04/21 121 lb 4 oz (55 kg)  ?12/03/20 121 lb 6.4 oz (55.1 kg)  ?11/11/20 123 lb 7.3 oz (56 kg)  ? ? ?   ?Assessment & Plan:  ? ?Problem List Items Addressed This Visit   ?None ?Visit Diagnoses   ? ? 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 pt has several episodes since Dec. - reports it always starts out the same way as a small lesion, then quickly spreads and becomes tingling, burning, & itchy. Pt reports having a lot of emotional stress recently. Sending Valtrex again, advised on use & SE, & once lesion is completely healed & no sx, needs to schedule the Shringrix vaccine here in office or pharmacy. ? ?Relevant Medications  ? valACYclovir (VALTREX) 1000 MG tablet  ? Skin rash      ? 2-3 pinpoint lesions on upper back, do not appear as shingles type, reports itching, no pain. Has used hydrocortisone and Eucerin for eczema cream.  Sending Triamcinolone, advised to use first, then cover with the Eucerin bid. If no better, advised to call her DERM that she is established with. ? ?Relevant Medications  ? triamcinolone cream (KENALOG) 0.1 %  ? ?  ? ?Meds ordered this encounter  ?Medications  ? valACYclovir (VALTREX) 1000 MG tablet  ?  Sig: Take 1 tablet (1,000 mg total) by mouth 3 (three) times daily for 7 days.  ?  Dispense:  21 tablet  ?  Refill:  0  ?  Order Specific Question:   Supervising Provider  ?  Answer:   ANDY, CAMILLE L [[id number]]  ? triamcinolone cream (KENALOG) 0.1 %   ?  Sig: Apply 1 application. topically 2 (two) times daily. upper back rash  ?  Dispense:  30 g  ?  Refill:  0  ?  Order Specific Question:   Supervising Provider  ?  Answer:   ANDY, CAMILLE L [[id number]]  ? ? ?Dulce Sellar,  NP ? ?

## 2021-11-04 NOTE — Patient Instructions (Signed)
It was very nice to see you today! ? ?I have sent Valtrex to your pharmacy for 7 days, complete this regimen, and then call the office to schedule a nurse visit to get the Shingles vaccine. ? ?For the spots on your back, use hydrocortisone and then apply Eucerin for eczema or very dry skin over this, applying twice a day. Any worsening of rash, let us know. ? ? ? ? ?PLEASE NOTE: ? ?If you had any lab tests please let us know if you have not heard back within a few days. You may see your results on MyChart before we have a chance to review them but we will give you a call once they are reviewed by [place]. If we ordered any referrals today, please let us know if you have not heard from their office within the next week.  ? ?Please try these tips to maintain a healthy lifestyle: ? ?Eat most of your calories during the day when you are active. Eliminate processed foods including packaged sweets (pies, cakes, cookies), reduce intake of potatoes, white bread, white pasta, and white rice. Look for whole grain options, oat flour or almond flour. ? ?Each meal should contain half fruits/vegetables, one quarter protein, and one quarter carbs (no bigger than a computer mouse). ? ?Cut down on sweet beverages. This includes juice, soda, and sweet tea. Also watch fruit intake, though this is a healthier sweet option, it still contains natural sugar! Limit to 3 servings daily. ? ?Drink at least 1 glass of water with each meal and aim for at least 8 glasses per day ? ?Exercise at least 150 minutes every week.  ? ?

## 2021-11-28 ENCOUNTER — Telehealth: Payer: Self-pay | Admitting: Internal Medicine

## 2021-11-28 MED ORDER — DEXLANSOPRAZOLE 60 MG PO CPDR: DELAYED_RELEASE_CAPSULE | ORAL | 1 refills | Status: DC

## 2021-11-28 NOTE — Telephone Encounter (Signed)
Refill sent in

## 2021-11-28 NOTE — Telephone Encounter (Signed)
Inbound call from patient requesting medication refill for Dexilant sent to [facility] on [address] ?

## 2021-11-30 ENCOUNTER — Ambulatory Visit: Payer: BC Managed Care – PPO

## 2021-12-08 ENCOUNTER — Other Ambulatory Visit: Payer: Self-pay

## 2021-12-08 ENCOUNTER — Ambulatory Visit (INDEPENDENT_AMBULATORY_CARE_PROVIDER_SITE_OTHER): Payer: BC Managed Care – PPO | Admitting: Family Medicine

## 2021-12-08 ENCOUNTER — Encounter: Payer: Self-pay | Admitting: Family Medicine

## 2021-12-08 VITALS — BP 100/78 | HR 59 | Temp 97.6°F | Ht 66.0 in | Wt 122.6 lb

## 2021-12-08 DIAGNOSIS — R3915 Urgency of urination: Secondary | ICD-10-CM

## 2021-12-08 DIAGNOSIS — E785 Hyperlipidemia, unspecified: Secondary | ICD-10-CM

## 2021-12-08 DIAGNOSIS — K219 Gastro-esophageal reflux disease without esophagitis: Secondary | ICD-10-CM

## 2021-12-08 DIAGNOSIS — F419 Anxiety disorder, unspecified: Secondary | ICD-10-CM

## 2021-12-08 DIAGNOSIS — E039 Hypothyroidism, unspecified: Secondary | ICD-10-CM

## 2021-12-08 DIAGNOSIS — Z0000 Encounter for general adult medical examination without abnormal findings: Secondary | ICD-10-CM

## 2021-12-08 DIAGNOSIS — I341 Nonrheumatic mitral (valve) prolapse: Secondary | ICD-10-CM

## 2021-12-08 LAB — CBC WITH DIFFERENTIAL/PLATELET
Basophils Absolute: 0.1 10*3/uL (ref 0.0–0.1)
Basophils Relative: 1.1 % (ref 0.0–3.0)
Eosinophils Absolute: 0.1 10*3/uL (ref 0.0–0.7)
Eosinophils Relative: 1.5 % (ref 0.0–5.0)
HCT: 38 % (ref 36.0–46.0)
Hemoglobin: 12.5 g/dL (ref 12.0–15.0)
Lymphocytes Relative: 30.2 % (ref 12.0–46.0)
Lymphs Abs: 1.4 10*3/uL (ref 0.7–4.0)
MCHC: 33.1 g/dL (ref 30.0–36.0)
MCV: 92 fl (ref 78.0–100.0)
Monocytes Absolute: 0.4 10*3/uL (ref 0.1–1.0)
Monocytes Relative: 8.6 % (ref 3.0–12.0)
Neutro Abs: 2.7 10*3/uL (ref 1.4–7.7)
Neutrophils Relative %: 58.6 % (ref 43.0–77.0)
Platelets: 257 10*3/uL (ref 150.0–400.0)
RBC: 4.13 Mil/uL (ref 3.87–5.11)
RDW: 14.6 % (ref 11.5–15.5)
WBC: 4.6 10*3/uL (ref 4.0–10.5)

## 2021-12-08 LAB — COMPREHENSIVE METABOLIC PANEL
ALT: 32 U/L (ref 0–35)
AST: 35 U/L (ref 0–37)
Albumin: 4.4 g/dL (ref 3.5–5.2)
Alkaline Phosphatase: 104 U/L (ref 39–117)
BUN: 15 mg/dL (ref 6–23)
CO2: 29 mEq/L (ref 19–32)
Calcium: 9.7 mg/dL (ref 8.4–10.5)
Chloride: 102 mEq/L (ref 96–112)
Creatinine, Ser: 0.74 mg/dL (ref 0.40–1.20)
GFR: 88.48 mL/min (ref >60.00)
Glucose, Bld: 87 mg/dL (ref 70–99)
Potassium: 4.5 mEq/L (ref 3.5–5.1)
Sodium: 139 mEq/L (ref 135–145)
Total Bilirubin: 0.7 mg/dL (ref 0.2–1.2)
Total Protein: 7.1 g/dL (ref 6.0–8.3)

## 2021-12-08 LAB — POC URINALSYSI DIPSTICK (AUTOMATED)
Bilirubin, UA: NEGATIVE
Blood, UA: NEGATIVE
Glucose, UA: NEGATIVE
Ketones, UA: NEGATIVE
Nitrite, UA: NEGATIVE
Protein, UA: NEGATIVE
Spec Grav, UA: 1.02 (ref 1.010–1.025)
Urobilinogen, UA: 0.2 E.U./dL
pH, UA: 6 (ref 5.0–8.0)

## 2021-12-08 LAB — TSH: TSH: 4.35 u[IU]/mL (ref 0.35–5.50)

## 2021-12-08 LAB — LIPID PANEL
Cholesterol: 264 mg/dL — ABNORMAL HIGH (ref 0–200)
HDL: 151.8 mg/dL (ref >39.00)
LDL Cholesterol: 103 mg/dL — ABNORMAL HIGH (ref 0–99)
NonHDL: 112.39
Total CHOL/HDL Ratio: 2
Triglycerides: 49 mg/dL (ref 0.0–149.0)
VLDL: 9.8 mg/dL (ref 0.0–40.0)

## 2021-12-08 NOTE — Patient Instructions (Addendum)
Health Maintenance Due  ?Topic Date Due  ? Zoster Vaccines- Shingrix (1 of 2) Never done  ?Consider in later date- can call back for nurse visit or get at pharmacy ? ?Please stop by lab before you go ?If you have mychart- we will send your results within 3 business days of [place] receiving them.  ?If you do not have mychart- we will call you about results within 5 business days of [place] receiving them.  ?*please also note that you will see labs on mychart as soon as they post. I will later go in and write notes on them- will say "notes from Dr. Yong Channel"  ? ?Recommended follow up: Return in about 1 year (around 12/09/2022) for physical or sooner if needed.Schedule b4 you leave. ?

## 2021-12-09 LAB — URINE CULTURE
MICRO NUMBER:: 13321094
SPECIMEN QUALITY:: ADEQUATE

## 2021-12-21 ENCOUNTER — Ambulatory Visit: Payer: BC Managed Care – PPO | Admitting: Internal Medicine

## 2021-12-22 DIAGNOSIS — F411 Generalized anxiety disorder: Secondary | ICD-10-CM | POA: Diagnosis not present

## 2021-12-22 DIAGNOSIS — F331 Major depressive disorder, recurrent, moderate: Secondary | ICD-10-CM | POA: Diagnosis not present

## 2022-01-04 ENCOUNTER — Encounter: Payer: Self-pay | Admitting: Internal Medicine

## 2022-01-04 ENCOUNTER — Ambulatory Visit (INDEPENDENT_AMBULATORY_CARE_PROVIDER_SITE_OTHER): Payer: BC Managed Care – PPO | Admitting: Internal Medicine

## 2022-01-04 VITALS — BP 110/64 | HR 60 | Ht 66.0 in | Wt 124.0 lb

## 2022-01-04 DIAGNOSIS — K219 Gastro-esophageal reflux disease without esophagitis: Secondary | ICD-10-CM

## 2022-01-04 DIAGNOSIS — R1319 Other dysphagia: Secondary | ICD-10-CM

## 2022-01-04 MED ORDER — DEXLANSOPRAZOLE 60 MG PO CPDR: DELAYED_RELEASE_CAPSULE | ORAL | 3 refills | Status: DC

## 2022-01-04 MED ORDER — FAMOTIDINE 40 MG PO TABS
ORAL_TABLET | ORAL | 3 refills | Status: DC
Start: 2022-01-04 — End: 2023-01-03

## 2022-01-04 NOTE — Patient Instructions (Signed)
We have sent the following medications to your pharmacy for you to pick up at your convenience: Famotidine and Dexilant  If you feel like you need a dilation call us back if your swallowing issues get worse.  Your colon is due next year.   I appreciate the opportunity to care for you. Stan Head, MD, [facility]

## 2022-01-04 NOTE — Progress Notes (Signed)
[name] Case 61 y.o. [birth year] [id number]  Assessment & Plan:   Encounter Diagnoses  Name Primary?   Gastroesophageal reflux disease, unspecified whether esophagitis present Yes   Esophageal dysphagia     Continue current treatment plan.  She will let me know if she thinks she is ready to do an EGD with dilation.  If not before then most likely would at the time of colonoscopy next year.  Should she need an EGD with dilation for worsening dysphagia I told her to call and we should be able to set this up as a direct procedure.    Subjective:   Chief Complaint: Follow-up of GERD  HPI 61 year old white woman with a long history of GERD syndrome and intermittent dysphagia without discrete strictures.  She has been maintained on PPI (Dexilant) and as needed H2 blocker.  Last seen 60 years ago.  She reports continued use of daily 60 mg Dexilant on an intermittent but frequent Pepcid.  She still has heartburn symptoms and over the past few months has noted a little bit of problems with dysphagia to breaded foods.  She does not feel like it is time for a dilation.  She last had that in 2019 (61 [place]).  Wt Readings from Last 3 Encounters:  01/04/22 124 lb (56.2 kg)  12/08/21 122 lb 9.6 oz (55.6 kg)  11/04/21 121 lb 4 oz (55 kg)    Social history-downsized last year, expecting first grandchild from daughter living in [place]  Allergies  Allergen Reactions   Sulfamethoxazole     REACTION: hives   Tetracycline Hcl     REACTION: ulcers in stomach   Current Meds  Medication Sig   buPROPion (WELLBUTRIN XL) 300 MG 24 hr tablet Take 300 mg by mouth daily.   cyanocobalamin 1000 MCG tablet Take 1,000 mcg by mouth daily.   dexlansoprazole (DEXILANT) 60 MG capsule TAKE 1 CAPSULE(60 MG) BY MOUTH DAILY   famotidine (PEPCID) 40 MG tablet TAKE 1 TABLET(40 MG) BY MOUTH DAILY AS NEEDED FOR HEARTBURN OR INDIGESTION   levothyroxine (SYNTHROID) 150 MCG tablet TAKE ONE TABLET  BY MOUTH DAILY   triamcinolone cream (KENALOG) 0.1 % Apply 1 application. topically 2 (two) times daily. upper back rash   zolpidem (AMBIEN) 10 MG tablet Take 10 mg by mouth at bedtime as needed.   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   Pill esophagitis 2007   PONV (postoperative nausea and vomiting)    Syncope and collapse 05/01/2017   some concern TIA stroke- eventually told no   Past Surgical History:  Procedure Laterality Date   APPENDECTOMY     BREAST BIOPSY Left 11/21/2019   benign   BREAST LUMPECTOMY WITH RADIOACTIVE SEED LOCALIZATION Left 11/21/2019   Procedure: LEFT BREAST LUMPECTOMY WITH RADIOACTIVE  SEED LOCALIZATION;  Surgeon: Griselda Miner, MD;  Location: [facility];  Service: General;  Laterality: Left;   COLONOSCOPY  multiple   Normal   UPPER GASTROINTESTINAL ENDOSCOPY  02/08/2009   inflammatory nodule in antrum, 50 Fr Maloney dilation   UPPER GASTROINTESTINAL ENDOSCOPY  2007x2   pill esophagitis/ulcer   UPPER GASTROINTESTINAL ENDOSCOPY  08/14/2002   erosive esophagitis and mild esophageal stricture - dilated   Social History   Social History Narrative   Married. 2 children. No grandkids. English Bulldog and boston terrier. grandpet-rescue dog.    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    Husband still works full time- plans for 10 more years in 2021      Hobbies: exercise- pilates, travel ([place] favorite spot)   family history includes Hypertension in her brother and father.   Review of Systems As per HPI  Objective:   Physical Exam BP 110/64   Pulse 60   Ht 5\' 6"  (1.676 m)   Wt 124 lb (56.2 kg)   LMP 11/28/2012   BMI 20.01 kg/m

## 2022-01-27 ENCOUNTER — Ambulatory Visit: Payer: BC Managed Care – PPO

## 2022-02-08 ENCOUNTER — Ambulatory Visit
Admission: RE | Admit: 2022-02-08 | Discharge: 2022-02-08 | Disposition: A | Discharge status: Home / Self Care | Payer: BC Managed Care – PPO | Source: Ambulatory Visit | Attending: Family Medicine | Admitting: Family Medicine

## 2022-02-08 DIAGNOSIS — Z1231 Encounter for screening mammogram for malignant neoplasm of breast: Secondary | ICD-10-CM | POA: Diagnosis not present

## 2022-02-23 ENCOUNTER — Other Ambulatory Visit: Payer: Self-pay | Admitting: Internal Medicine

## 2022-02-24 ENCOUNTER — Other Ambulatory Visit (HOSPITAL_COMMUNITY): Payer: Self-pay

## 2022-02-24 ENCOUNTER — Telehealth: Payer: Self-pay | Admitting: Pharmacy Technician

## 2022-02-24 NOTE — Telephone Encounter (Signed)
Patient Advocate Encounter  Received notification from [facility] OFFICE that prior authorization for DEXLANSOPRAZOLE 60MG  is required.   PA submitted on 7.14.23 Key [id number] Status is pending    [id number], CPhT Patient Advocate Phone: [phone]

## 2022-02-27 NOTE — Telephone Encounter (Signed)
Patient called to follow up on PA for Dexilant medication. States she is out of the medication.

## 2022-02-27 NOTE — Telephone Encounter (Signed)
Hanne informed that the prior authorization was started on Friday. Hopefully we will know something soon.

## 2022-03-01 NOTE — Telephone Encounter (Signed)
Patient called states the insurance has denied the PA request for Dexilant medication and is seeking further advise. She also said she has been completely out of the medication since Monday and needs it.

## 2022-03-01 NOTE — Telephone Encounter (Signed)
[name] Case called and she has spoken to [name]. They told her we failed to tell them that the medicines she had tried DIDN'T WORK. They told her if we could mark it URGENT and tell them this that it should be approved. I will route this to our prior authorization co-worker marked high priority.

## 2022-03-02 ENCOUNTER — Telehealth: Payer: Self-pay | Admitting: Pharmacy Technician

## 2022-03-02 ENCOUNTER — Other Ambulatory Visit: Payer: Self-pay | Admitting: Internal Medicine

## 2022-03-02 ENCOUNTER — Other Ambulatory Visit (HOSPITAL_COMMUNITY): Payer: Self-pay

## 2022-03-02 MED ORDER — LANSOPRAZOLE 30 MG PO CPDR: 30.0000 mg | DELAYED_RELEASE_CAPSULE | Freq: Two times a day (BID) | ORAL | 3 refills | Status: DC

## 2022-03-02 NOTE — Telephone Encounter (Signed)
Patient informed of the plan and said thank you for your help. The 30mg  lansoprazole has been sent in.

## 2022-03-02 NOTE — Telephone Encounter (Signed)
Patient Advocate Encounter  Received response regarding Prior Authorization from [employer] for Dexlansoprazole 60MG  dr capsules.  Authorization has been DENIED because patient has not tried or shown intolerance to at least two preferred medications (Esomeprazole, Lansoprazole, Omeprazole, Rabeprazole).  Patient chart indicates prior ineffective therapies are: esomeprazole, pantoprazole, famotidine.  , CPhT Pharmacy Patient Advocate Specialist [facility] Patient Advocate Team Phone: [phone]   Fax: [phone]

## 2022-03-02 NOTE — Addendum Note (Signed)
Addended by: [place], Sua Spadafora E on: 03/02/2022 12:14 PM   Modules accepted: Orders

## 2022-03-02 NOTE — Telephone Encounter (Signed)
I called and told [name] Case that I am sending this for a prior authorization right now. She is aware she can get OTC lansoprazole to use until we get this sorted out.

## 2022-03-02 NOTE — Telephone Encounter (Signed)
PA has been submitted. Telephone encounter has been created.  

## 2022-03-02 NOTE — Telephone Encounter (Signed)
Patient Advocate Encounter  Received notification from [facility] OFFICE that prior authorization for LANSOPRAZOLE 30MG  is required.   PA submitted on 7.20.23 Key [id number] Status is pending    [id number], CPhT Patient Advocate Phone: [phone]

## 2022-03-02 NOTE — Telephone Encounter (Signed)
Try lansoprazole 30 mg twice daily before breakfast and supper #180 with 3 refills   Short-term she can purchase some over-the-counter lansoprazole and take to 15 mg twice daily if she needs to   If she fails this then she should qualify for Dexilant again per their guidelines

## 2022-03-02 NOTE — Telephone Encounter (Signed)
I spoke with [name] Case and she request a 90 day supply of whichever one we send in and she request a call back to let her know which rx we send in.

## 2022-03-02 NOTE — Telephone Encounter (Signed)
Patient Advocate Encounter  Prior Authorization for LANSOPRAZOLE 30MG  has been approved.    PA# Effective dates: 7.20.23 through 7.20.24  Adric Wrede B. CPhT P: [phone] F: [phone]

## 2022-03-02 NOTE — Telephone Encounter (Signed)
Please advise Sir, thank you. 

## 2022-03-03 ENCOUNTER — Other Ambulatory Visit (HOSPITAL_COMMUNITY): Payer: Self-pay

## 2022-03-03 ENCOUNTER — Other Ambulatory Visit: Payer: Self-pay | Admitting: Internal Medicine

## 2022-03-03 NOTE — Telephone Encounter (Signed)
This medication has been filled at retail level. Next Payable fill on or after 10.5.23

## 2022-03-08 DIAGNOSIS — F411 Generalized anxiety disorder: Secondary | ICD-10-CM | POA: Diagnosis not present

## 2022-03-08 DIAGNOSIS — F331 Major depressive disorder, recurrent, moderate: Secondary | ICD-10-CM | POA: Diagnosis not present

## 2022-05-08 ENCOUNTER — Encounter: Payer: Self-pay | Admitting: *Deleted

## 2022-05-30 DIAGNOSIS — F331 Major depressive disorder, recurrent, moderate: Secondary | ICD-10-CM | POA: Diagnosis not present

## 2022-05-30 DIAGNOSIS — F411 Generalized anxiety disorder: Secondary | ICD-10-CM | POA: Diagnosis not present

## 2022-06-05 DIAGNOSIS — D225 Melanocytic nevi of trunk: Secondary | ICD-10-CM | POA: Diagnosis not present

## 2022-06-05 DIAGNOSIS — L821 Other seborrheic keratosis: Secondary | ICD-10-CM | POA: Diagnosis not present

## 2022-06-05 DIAGNOSIS — L814 Other melanin hyperpigmentation: Secondary | ICD-10-CM | POA: Diagnosis not present

## 2022-07-27 ENCOUNTER — Encounter: Payer: Self-pay | Admitting: *Deleted

## 2022-08-22 DIAGNOSIS — F411 Generalized anxiety disorder: Secondary | ICD-10-CM | POA: Diagnosis not present

## 2022-08-22 DIAGNOSIS — F5105 Insomnia due to other mental disorder: Secondary | ICD-10-CM | POA: Diagnosis not present

## 2022-08-22 DIAGNOSIS — F331 Major depressive disorder, recurrent, moderate: Secondary | ICD-10-CM | POA: Diagnosis not present

## 2022-09-22 ENCOUNTER — Telehealth: Payer: BC Managed Care – PPO | Admitting: Nurse Practitioner

## 2022-09-22 DIAGNOSIS — N952 Postmenopausal atrophic vaginitis: Secondary | ICD-10-CM | POA: Insufficient documentation

## 2022-09-22 DIAGNOSIS — N941 Unspecified dyspareunia: Secondary | ICD-10-CM | POA: Insufficient documentation

## 2022-09-22 DIAGNOSIS — N959 Unspecified menopausal and perimenopausal disorder: Secondary | ICD-10-CM | POA: Insufficient documentation

## 2022-09-22 DIAGNOSIS — J0140 Acute pansinusitis, unspecified: Secondary | ICD-10-CM

## 2022-09-22 MED ORDER — AMOXICILLIN-POT CLAVULANATE 875-125 MG PO TABS: 1.0000 | ORAL_TABLET | Freq: Two times a day (BID) | ORAL | 0 refills | Status: AC

## 2022-09-22 NOTE — Progress Notes (Signed)
E-Visit for Sinus Problems  We are sorry that you are not feeling well.  Here is how we plan to help!  Based on what you have shared with me it looks like you have sinusitis.  Sinusitis is inflammation and infection in the sinus cavities of the head.  Based on your presentation I believe you most likely have Acute Bacterial Sinusitis.  This is an infection caused by bacteria and is treated with antibiotics. I have prescribed Augmentin 840m/125mg one tablet twice daily with food, for 7 days. You may use an oral decongestant such as Mucinex D or if you have glaucoma or high blood pressure use plain Mucinex. Saline nasal spray help and can safely be used as often as needed for congestion.  If you develop worsening sinus pain, fever or notice severe headache and vision changes, or if symptoms are not better after completion of antibiotic, please schedule an appointment with a health care provider.    Sinus infections are not as easily transmitted as other respiratory infection, however we still recommend that you avoid close contact with loved ones, especially the very young and elderly.  Remember to wash your hands thoroughly throughout the day as this is the number one way to prevent the spread of infection!  Home Care: Only take medications as instructed by your medical team. Complete the entire course of an antibiotic. Do not take these medications with alcohol. A steam or ultrasonic humidifier can help congestion.  You can place a towel over your head and breathe in the steam from hot water coming from a faucet. Avoid close contacts especially the very young and the elderly. Cover your mouth when you cough or sneeze. Always remember to wash your hands.  Get Help Right Away If: You develop worsening fever or sinus pain. You develop a severe head ache or visual changes. Your symptoms persist after you have completed your treatment plan.  Make sure you Understand these instructions. Will watch  your condition. Will get help right away if you are not doing well or get worse.  Thank you for choosing an e-visit.  Your e-visit answers were reviewed by a board certified advanced clinical practitioner to complete your personal care plan. Depending upon the condition, your plan could have included both over the counter or prescription medications.  Please review your pharmacy choice. Make sure the pharmacy is open so you can pick up prescription now. If there is a problem, you may contact your provider through [employer] have the prescription routed to another pharmacy.  Your safety is important to [place] If you have drug allergies check your prescription carefully.   For the next 24 hours you can use MyChart to ask questions about today's visit, request a non-urgent call back, or ask for a work or school excuse. You will get an email in the next two days asking about your experience. I hope that your e-visit has been valuable and will speed your recovery.   Meds ordered this encounter  Medications   amoxicillin-clavulanate (AUGMENTIN) 875-125 MG tablet    Sig: Take 1 tablet by mouth 2 (two) times daily for 7 days. Take with food    Dispense:  14 tablet    Refill:  0    I spent approximately 5 minutes reviewing the patient's history, current symptoms and coordinating their care today.

## 2022-10-12 ENCOUNTER — Other Ambulatory Visit: Payer: Self-pay | Admitting: Family Medicine

## 2022-10-23 ENCOUNTER — Encounter: Payer: Self-pay | Admitting: Internal Medicine

## 2022-10-23 DIAGNOSIS — Z01419 Encounter for gynecological examination (general) (routine) without abnormal findings: Secondary | ICD-10-CM | POA: Diagnosis not present

## 2022-10-23 DIAGNOSIS — Z6820 Body mass index (BMI) 20.0-20.9, adult: Secondary | ICD-10-CM | POA: Diagnosis not present

## 2022-11-21 DIAGNOSIS — F331 Major depressive disorder, recurrent, moderate: Secondary | ICD-10-CM | POA: Diagnosis not present

## 2022-11-21 DIAGNOSIS — F411 Generalized anxiety disorder: Secondary | ICD-10-CM | POA: Diagnosis not present

## 2022-11-21 DIAGNOSIS — F5105 Insomnia due to other mental disorder: Secondary | ICD-10-CM | POA: Diagnosis not present

## 2022-11-22 ENCOUNTER — Other Ambulatory Visit: Payer: Self-pay | Admitting: Family Medicine

## 2022-11-22 DIAGNOSIS — Z1231 Encounter for screening mammogram for malignant neoplasm of breast: Secondary | ICD-10-CM

## 2022-12-01 DIAGNOSIS — F411 Generalized anxiety disorder: Secondary | ICD-10-CM | POA: Diagnosis not present

## 2022-12-01 DIAGNOSIS — F4323 Adjustment disorder with mixed anxiety and depressed mood: Secondary | ICD-10-CM | POA: Diagnosis not present

## 2022-12-11 ENCOUNTER — Encounter: Payer: BC Managed Care – PPO | Admitting: Family Medicine

## 2022-12-14 DIAGNOSIS — F411 Generalized anxiety disorder: Secondary | ICD-10-CM | POA: Diagnosis not present

## 2022-12-14 DIAGNOSIS — F4323 Adjustment disorder with mixed anxiety and depressed mood: Secondary | ICD-10-CM | POA: Diagnosis not present

## 2022-12-15 DIAGNOSIS — M25511 Pain in right shoulder: Secondary | ICD-10-CM | POA: Diagnosis not present

## 2022-12-15 DIAGNOSIS — M7712 Lateral epicondylitis, left elbow: Secondary | ICD-10-CM | POA: Diagnosis not present

## 2022-12-15 DIAGNOSIS — M542 Cervicalgia: Secondary | ICD-10-CM | POA: Diagnosis not present

## 2022-12-19 DIAGNOSIS — F411 Generalized anxiety disorder: Secondary | ICD-10-CM | POA: Diagnosis not present

## 2022-12-19 DIAGNOSIS — F331 Major depressive disorder, recurrent, moderate: Secondary | ICD-10-CM | POA: Diagnosis not present

## 2022-12-19 DIAGNOSIS — F5105 Insomnia due to other mental disorder: Secondary | ICD-10-CM | POA: Diagnosis not present

## 2022-12-21 ENCOUNTER — Encounter: Payer: Self-pay | Admitting: Internal Medicine

## 2022-12-21 ENCOUNTER — Ambulatory Visit (AMBULATORY_SURGERY_CENTER): Payer: BC Managed Care – PPO

## 2022-12-21 VITALS — Ht 66.0 in | Wt 118.0 lb

## 2022-12-21 DIAGNOSIS — Z1211 Encounter for screening for malignant neoplasm of colon: Secondary | ICD-10-CM

## 2022-12-21 DIAGNOSIS — N951 Menopausal and female climacteric states: Secondary | ICD-10-CM | POA: Insufficient documentation

## 2022-12-21 MED ORDER — NA SULFATE-K SULFATE-MG SULF 17.5-3.13-1.6 GM/177ML PO SOLN: 1.0000 | Freq: Once | ORAL | 0 refills | Status: AC

## 2022-12-21 NOTE — Progress Notes (Signed)
No egg or soy allergy known to patient  No issues known to pt with past sedation with any surgeries or procedures Patient denies ever being told they had issues or difficulty with intubation  No FH of Malignant Hyperthermia Pt is not on diet pills Pt is not on  home 02  Pt is not on blood thinners  Pt reports constipation. BM every other day. Stools tend to be hard rock like and requires her to strain. Pt does not notice in blood with BM. Instructions given for extra miralax a week prior to her procedure No A fib or A flutter Have any cardiac testing pending--no  Pt denies ability issues with mobility   Patient's chart reviewed by Cathlyn Parsons CNRA prior to previsit and patient appropriate for the [facility].  Previsit completed and red dot placed by patient's name on their procedure day (on provider's schedule).     PV complete with patient. Prep reviewed. Instructions sent via mychart and to mailing address. [facility] on [address] Good rx coupon provided in paperwork. Pt instructed to use Single[employer].com or GoodRx for a price reduction on prep

## 2022-12-26 DIAGNOSIS — F4323 Adjustment disorder with mixed anxiety and depressed mood: Secondary | ICD-10-CM | POA: Diagnosis not present

## 2022-12-26 DIAGNOSIS — F411 Generalized anxiety disorder: Secondary | ICD-10-CM | POA: Diagnosis not present

## 2023-01-01 DIAGNOSIS — M7712 Lateral epicondylitis, left elbow: Secondary | ICD-10-CM | POA: Diagnosis not present

## 2023-01-03 ENCOUNTER — Other Ambulatory Visit: Payer: Self-pay | Admitting: Internal Medicine

## 2023-01-12 ENCOUNTER — Ambulatory Visit (INDEPENDENT_AMBULATORY_CARE_PROVIDER_SITE_OTHER): Payer: BC Managed Care – PPO | Admitting: Family Medicine

## 2023-01-12 ENCOUNTER — Encounter: Payer: Self-pay | Admitting: Family Medicine

## 2023-01-12 VITALS — BP 127/78 | HR 64 | Temp 98.1°F | Resp 16 | Ht 66.0 in | Wt 116.4 lb

## 2023-01-12 DIAGNOSIS — E785 Hyperlipidemia, unspecified: Secondary | ICD-10-CM

## 2023-01-12 DIAGNOSIS — E538 Deficiency of other specified B group vitamins: Secondary | ICD-10-CM

## 2023-01-12 DIAGNOSIS — E039 Hypothyroidism, unspecified: Secondary | ICD-10-CM

## 2023-01-12 DIAGNOSIS — Z0000 Encounter for general adult medical examination without abnormal findings: Secondary | ICD-10-CM | POA: Diagnosis not present

## 2023-01-12 DIAGNOSIS — M85851 Other specified disorders of bone density and structure, right thigh: Secondary | ICD-10-CM

## 2023-01-12 LAB — COMPREHENSIVE METABOLIC PANEL
ALT: 38 U/L — ABNORMAL HIGH (ref 0–35)
AST: 33 U/L (ref 0–37)
Albumin: 4.3 g/dL (ref 3.5–5.2)
Alkaline Phosphatase: 85 U/L (ref 39–117)
BUN: 14 mg/dL (ref 6–23)
CO2: 30 mEq/L (ref 19–32)
Calcium: 9.7 mg/dL (ref 8.4–10.5)
Chloride: 100 mEq/L (ref 96–112)
Creatinine, Ser: 0.78 mg/dL (ref 0.40–1.20)
GFR: 82.43 mL/min (ref >60.00)
Glucose, Bld: 80 mg/dL (ref 70–99)
Potassium: 3.9 mEq/L (ref 3.5–5.1)
Sodium: 138 mEq/L (ref 135–145)
Total Bilirubin: 0.6 mg/dL (ref 0.2–1.2)
Total Protein: 7.1 g/dL (ref 6.0–8.3)

## 2023-01-12 LAB — LIPID PANEL
Cholesterol: 279 mg/dL — ABNORMAL HIGH (ref 0–200)
HDL: 141.3 mg/dL (ref >39.00)
LDL Cholesterol: 127 mg/dL — ABNORMAL HIGH (ref 0–99)
NonHDL: 138.11
Total CHOL/HDL Ratio: 2
Triglycerides: 54 mg/dL (ref 0.0–149.0)
VLDL: 10.8 mg/dL (ref 0.0–40.0)

## 2023-01-12 LAB — CBC WITH DIFFERENTIAL/PLATELET
Basophils Absolute: 0 10*3/uL (ref 0.0–0.1)
Basophils Relative: 0.7 % (ref 0.0–3.0)
Eosinophils Absolute: 0.1 10*3/uL (ref 0.0–0.7)
Eosinophils Relative: 2.5 % (ref 0.0–5.0)
HCT: 38.5 % (ref 36.0–46.0)
Hemoglobin: 12.6 g/dL (ref 12.0–15.0)
Lymphocytes Relative: 28.5 % (ref 12.0–46.0)
Lymphs Abs: 1.3 10*3/uL (ref 0.7–4.0)
MCHC: 32.7 g/dL (ref 30.0–36.0)
MCV: 93 fl (ref 78.0–100.0)
Monocytes Absolute: 0.4 10*3/uL (ref 0.1–1.0)
Monocytes Relative: 9.7 % (ref 3.0–12.0)
Neutro Abs: 2.7 10*3/uL (ref 1.4–7.7)
Neutrophils Relative %: 58.6 % (ref 43.0–77.0)
Platelets: 257 10*3/uL (ref 150.0–400.0)
RBC: 4.14 Mil/uL (ref 3.87–5.11)
RDW: 15.3 % (ref 11.5–15.5)
WBC: 4.7 10*3/uL (ref 4.0–10.5)

## 2023-01-12 LAB — VITAMIN B12: Vitamin B-12: >1500 pg/mL — ABNORMAL HIGH (ref 211–911)

## 2023-01-12 LAB — TSH: TSH: 4.19 u[IU]/mL (ref 0.35–5.50)

## 2023-01-12 NOTE — Patient Instructions (Addendum)
Schedule your bone density test at check out desk.  - located [address] across the street from [place] - in the basement - you DO NEED an appointment for the bone density tests.   Please stop by lab before you go If you have mychart- we will send your results within 3 business days of [place] receiving them.  If you do not have mychart- we will call you about results within 5 business days of [place] receiving them.  *please also note that you will see labs on mychart as soon as they post. I will later go in and write notes on them- will say "notes from Dr. Durene Cal"   Recommended follow up: Return in about 1 year (around 01/12/2024) for physical or sooner if needed.Schedule b4 you leave.

## 2023-01-12 NOTE — Progress Notes (Signed)
Phone [phone]   Subjective:  Patient presents today for their annual physical. Chief complaint-noted.   See problem oriented charting- ROS- full  review of systems was completed and negative except for: painful sex- working on lubrication, sad mood, sleep disturbance  The following were reviewed and entered/updated in epic: 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   Pill esophagitis 2007   PONV (postoperative nausea and vomiting)    Syncope and collapse 05/01/2017   some concern TIA stroke- eventually told no   Patient Active Problem List   Diagnosis Date Noted   Left arm numbness 04/30/2017    Priority: High   Osteopenia 01/02/2020    Priority: Medium    Hyperlipidemia 12/03/2019    Priority: Medium    Anxiety 10/10/2010    Priority: Medium    Hypothyroidism 07/18/2007    Priority: Medium    GERD 07/18/2007    Priority: Medium    Syncope and collapse 05/01/2017    Priority: Low   Dysphagia 02/24/2011    Priority: Low   Mitral valve prolapse 07/18/2007    Priority: Low   Perimenopausal vasomotor symptoms 12/21/2022   Dyspareunia in female 09/22/2022   Perimenopausal disorder 09/22/2022   Vaginal atrophy 09/22/2022   Acid reflux 10/13/2020   Disorder of vitamin B12 10/13/2020   Past Surgical History:  Procedure Laterality Date   APPENDECTOMY     BREAST BIOPSY Left 11/21/2019    benign   BREAST EXCISIONAL BIOPSY Left 11/2019   BREAST LUMPECTOMY WITH RADIOACTIVE SEED LOCALIZATION Left 11/21/2019   Procedure: LEFT BREAST LUMPECTOMY WITH RADIOACTIVE SEED LOCALIZATION;  Surgeon: Griselda Miner, MD;  Location: [facility];  Service: General;  Laterality: Left;   COLONOSCOPY  multiple   Normal   UPPER GASTROINTESTINAL ENDOSCOPY  02/08/2009   inflammatory nodule in antrum, 50 Fr Maloney dilation   UPPER GASTROINTESTINAL ENDOSCOPY  2007x2   pill esophagitis/ulcer   UPPER GASTROINTESTINAL ENDOSCOPY  08/14/2002   erosive esophagitis and mild esophageal stricture - dilated    Family History  Problem Relation Age of Onset   Hypertension Father    Breast cancer Maternal Aunt        30s   Hypertension Brother    Colon cancer Neg Hx    Stomach cancer Neg Hx    Esophageal cancer Neg Hx    Rectal cancer Neg Hx     Medications- reviewed and updated Current Outpatient Medications  Medication Sig Dispense Refill   buPROPion (WELLBUTRIN XL) 300 MG 24 hr tablet Take 300 mg by mouth daily.     cyanocobalamin 1000 MCG tablet Take 1,000 mcg by mouth daily.     levothyroxine (SYNTHROID) 150 MCG tablet TAKE 1 TABLET BY MOUTH EVERY DAY 90 tablet 3   meloxicam (MOBIC) 7.5 MG  tablet Take 7.5 mg by mouth 2 (two) times daily.     zolpidem (AMBIEN) 10 MG tablet Take 10 mg by mouth at bedtime as needed.     famotidine (PEPCID) 40 MG tablet TAKE 1 TABLET(40 MG) BY MOUTH DAILY AS NEEDED FOR HEARTBURN OR INDIGESTION (Patient not taking: Reported on 01/12/2023) 90 tablet 3   No current facility-administered medications for this visit.    Allergies-reviewed and updated Allergies  Allergen Reactions   Sulfamethoxazole     REACTION: hives   Tetracycline Hcl     REACTION: ulcers in stomach    Social History   Social History Narrative   Married. 2 children. Granddaughter 60 year old June 2024. English Bulldog and boston terrier. grandpet-rescue dog.    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 Also may do some consulting in 2   Husband still works full time- plans for 10 more years in 2021      Hobbies: exercise- pilates, travel ([place] favorite spot)   Objective  Objective:  BP 127/78   Pulse 64   Temp 98.1 F (36.7 C) (Temporal)   Resp 16   Ht 5\' 6"  (1.676 m)   Wt 116 lb 6.4 oz (52.8 kg)   LMP 11/28/2012   SpO2 100%   BMI 18.79 kg/m  Gen: NAD, resting comfortably HEENT: Mucous membranes are moist. Oropharynx normal Neck: no thyromegaly CV: RRR no murmurs rubs or gallops Lungs: CTAB no crackles, wheeze, rhonchi Abdomen: soft/nontender/nondistended/normal bowel sounds. No rebound or guarding.  Ext: no edema Skin: warm, dry Neuro: grossly normal, moves all extremities, PERRLA   Assessment and Plan   62 y.o. female presenting for annual physical.  Health Maintenance counseling: 1. Anticipatory guidance: Patient counseled regarding regular dental exams -q6 months, eye exams -yearly,  avoiding smoking and second hand smoke , limiting alcohol to 1 beverage per day- 4-5 per week still , no illicit drugs .   2. Risk factor reduction:  Advised patient of need for regular exercise and diet rich and fruits and vegetables to reduce risk of heart attack and stroke.  Exercise- working with physical therapy and getting dry needling on elbow- set back n pilates but her doing her walking.  Diet/weight management- reasonably healthy- thinks related to the stress that she has had- mentioned Wellbutrin could curb appetite. Did advise trial at least once a day smoothie or protein shake between meals Wt Readings from Last 3 Encounters:  01/12/23 116 lb 6.4 oz (52.8 kg)  12/21/22 118 lb (53.5 kg)  01/04/22 124 lb (56.2 kg)  3. Immunizations/screenings/ancillary studies-opting out COVID shots otherwise up to date  Immunization History  Administered  Date(s) Administered   Influenza Whole 06/13/2019   Influenza,inj,Quad PF,6+ Mos 05/01/2017   Influenza-Unspecified 05/14/2013, 05/28/2014, 05/14/2020, 05/17/2022   Moderna Sars-Covid-2 Vaccination 08/21/2019, 09/18/2019, 06/17/2020   Tdap 03/17/2016   Zoster Recombinat (Shingrix) 08/24/2022, 10/27/2022  4. Cervical cancer screening- 10/13/2020 with 3 year repeat planned- - [facility] 5. Breast cancer screening-  breast exam with GYN and mammogram 02/08/22 and scheduled for July already6. Colon cancer screening - 12/27/12 with 10 year repeat planned - sees Dr. Leone Payor - and scheduled for tuesday 7. Skin cancer screening--follows with [facility] dermatology yearly.  advised regular sunscreen use. Denies worrisome, changing, or new skin lesions.  8. Birth control/STD check- monogamous/postmenopausal 9. Osteoporosis screening at 65-  worst T score  in 2021 -1.5 in right femoral neck-have discussed calcium through diet/vitamin D - work on  weightbearing exercise- update DEXA at this time -never smoker   Status of chronic or acute concerns    #Hypothyroidism S: Compliant with levothyroxine 150 mcg Lab Results  Component Value Date   TSH 4.35 12/08/2021  A/P: hopefully stable- update tsh today. Continue current meds for now    #Insomnia/anxiety/stress S:Sees psychiatry and is on wellbutrin 300mg  XL. Also on Ambien 10 mg nightly as needed through them.  - sees therapist Lorenda Cahill    01/12/2023    8:27 AM 12/08/2021   10:36 AM 12/03/2020   10:48 AM  Depression screen PHQ 2/9  Decreased Interest 0 0 0  Down, Depressed, Hopeless 1 0 0  PHQ - 2 Score 1 0 0  Altered sleeping 1 0 0  Tired, decreased energy 0 0 0  Change in appetite 0 0 0  Feeling bad or failure about yourself  0 0 0  Trouble concentrating 0 0 0  Moving slowly or fidgety/restless 0 0 0  Suicidal thoughts 0 0 0  PHQ-9 Score 2 0 0  Difficult doing work/chores Not difficult at all Not difficult at all Not difficult at all       01/12/2023    8:28 AM  GAD 7 : Generalized Anxiety Score  Nervous, Anxious, on Edge 0  Control/stop worrying 1  Worry too much - different things 1  Trouble relaxing 0  Restless 0  Easily annoyed or irritable 0  Afraid - awful might happen 0  Total GAD 7 Score 2  Anxiety Difficulty Not difficult at all  A/P: reasonable control but she is being very proactive to try to maintain this with therapy- encouraged her to continue her journey and continue current medications and psychiatyr follow up   -would love in the long run to do marriage counseling   GERD S: follows with GI as well- famotidine 40 mg as needed only  -prior Dexilant 60 mg and Carafate A/P:controlled- continue famotidine alone- glad she is doing well   #Mitral valve prolapse S: Noted on most recent echocardiogram in 2018 doing syncope work-up A/P: no recent issues noted- does not need repeat echocardiogram    # B12 deficiency S: Current treatment/medication (oral vs. IM): 1000 mcg   Lab Results  Component Value Date   VITAMINB12 >1506 (H) 12/03/2020  A/P: suspect stable- update B12- may be able to cut down off ppi  Recommended follow up: Return in about 1 year (around 01/12/2024) for physical or sooner if needed.Schedule b4 you leave. Future Appointments  Date Time Provider Department Center  01/16/2023  8:00 AM Iva Boop, MD [facility]  02/26/2023 10:00 AM [facility]   Lab/Order associations: fasting   ICD-10-CM   1. Preventative health care  Z00.00     2. Hypothyroidism, unspecified type  E03.9     3. Hyperlipidemia, unspecified hyperlipidemia type  E78.5     4. Disorder of vitamin B12  E53.8     5. Osteopenia of neck of right femur  M85.851       No orders of the defined types were placed in this encounter.   Return precautions advised.  Tana Conch, MD

## 2023-01-16 ENCOUNTER — Encounter: Payer: Self-pay | Admitting: Internal Medicine

## 2023-01-16 ENCOUNTER — Ambulatory Visit (AMBULATORY_SURGERY_CENTER): Payer: BC Managed Care – PPO | Admitting: Internal Medicine

## 2023-01-16 VITALS — BP 107/63 | HR 62 | Temp 98.1°F | Resp 11 | Ht 66.0 in | Wt 118.0 lb

## 2023-01-16 DIAGNOSIS — Z1211 Encounter for screening for malignant neoplasm of colon: Secondary | ICD-10-CM

## 2023-01-16 MED ORDER — SODIUM CHLORIDE 0.9 % IV SOLN
500.0000 mL | Freq: Once | INTRAVENOUS | Status: DC
Start: 2023-01-16 — End: 2023-01-16

## 2023-01-16 NOTE — Progress Notes (Signed)
PT nauseated post procedure. Zofran ODT 4mg  PO.

## 2023-01-16 NOTE — Progress Notes (Signed)
VS by CW  Pt's states no medical or surgical changes since previsit or office visit.  

## 2023-01-16 NOTE — Progress Notes (Signed)
PT feeling better after Zofran given. DC home.

## 2023-01-16 NOTE — Progress Notes (Signed)
[facility] History and Physical   Primary Care Physician:  Shelva Majestic, MD   Reason for Procedure:   Colon cancer screening  Plan:    colonoscopy     HPI: [name] Case is a 62 y.o. female for repeat screening exam 01/2013 negative   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   Pill esophagitis 2007   PONV (postoperative nausea and vomiting)    Syncope and collapse 05/01/2017   some concern TIA stroke- eventually told no    Past Surgical History:  Procedure Laterality Date   APPENDECTOMY     BREAST BIOPSY Left 11/21/2019   benign   BREAST EXCISIONAL BIOPSY Left 11/2019   BREAST LUMPECTOMY WITH RADIOACTIVE SEED LOCALIZATION Left 11/21/2019   Procedure: LEFT BREAST LUMPECTOMY WITH RADIOACTIVE SEED LOCALIZATION;  Surgeon: Griselda Miner, MD;  Location: [facility];  Service: General;  Laterality: Left;   COLONOSCOPY  multiple   Normal   UPPER GASTROINTESTINAL ENDOSCOPY  02/08/2009   inflammatory nodule in antrum, 50 Fr Maloney dilation   UPPER GASTROINTESTINAL ENDOSCOPY  2007x2   pill esophagitis/ulcer   UPPER GASTROINTESTINAL ENDOSCOPY  08/14/2002   erosive esophagitis and mild esophageal stricture - dilated    Prior to Admission medications   Medication Sig Start Date End Date Taking? Authorizing Provider  buPROPion (WELLBUTRIN XL) 300 MG 24 hr  tablet Take 300 mg by mouth daily.   Yes [provider]  cyanocobalamin 1000 MCG tablet Take 1,000 mcg by mouth daily.   Yes [provider]  levothyroxine (SYNTHROID) 150 MCG tablet TAKE 1 TABLET BY MOUTH EVERY DAY 10/12/22  Yes Shelva Majestic, MD  famotidine (PEPCID) 40 MG tablet TAKE 1 TABLET(40 MG) BY MOUTH DAILY AS NEEDED FOR HEARTBURN OR INDIGESTION Patient not taking: Reported on 01/12/2023 01/03/23   Iva Boop, MD  meloxicam (MOBIC) 7.5 MG tablet Take 7.5 mg by mouth 2 (two) times daily. 12/15/22   [provider]  zolpidem (AMBIEN) 10 MG tablet Take 10 mg by mouth at bedtime as needed. 05/03/17   [provider]    Current Outpatient Medications  Medication Sig Dispense Refill   buPROPion (WELLBUTRIN XL) 300 MG 24 hr tablet Take 300 mg by mouth daily.     cyanocobalamin 1000 MCG tablet Take 1,000 mcg by mouth daily.     levothyroxine (SYNTHROID) 150 MCG tablet TAKE 1 TABLET BY MOUTH EVERY DAY 90 tablet 3   famotidine (PEPCID) 40 MG tablet TAKE 1 TABLET(40 MG) BY MOUTH DAILY AS NEEDED FOR HEARTBURN OR INDIGESTION (Patient not taking: Reported on 01/12/2023) 90 tablet 3   meloxicam (MOBIC) 7.5 MG tablet Take 7.5 mg by mouth 2 (two) times daily.     zolpidem (AMBIEN) 10 MG tablet Take 10 mg by  mouth at bedtime as needed.     Current Facility-Administered Medications  Medication Dose Route Frequency Provider Last Rate Last Admin   0.9 %  sodium chloride infusion  500 mL Intravenous Once Iva Boop, MD        Allergies as of 01/16/2023 - Review Complete 01/16/2023  Allergen Reaction Noted   Sulfamethoxazole     Tetracycline hcl      Family History  Problem Relation Age of Onset   Hypertension Father    Breast cancer Maternal Aunt        30s   Hypertension Brother    Colon cancer Neg Hx    Stomach cancer Neg Hx    Esophageal cancer Neg Hx    Rectal cancer Neg Hx     Social History   Socioeconomic History   Marital status:  Married    Spouse name: Not on file   Number of children: 2   Years of education: Not on file   Highest education level: Not on file  Occupational History   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 2 children. Granddaughter 51 year old June 2024. English Bulldog and boston terrier. grandpet-rescue dog.    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  Physical Exam: Vital signs BP 121/83   Pulse (!) 59   Temp 98.1 F (36.7 C)   Ht 5\' 6"  (1.676 m)   Wt 118 lb (53.5 kg)   LMP 11/28/2012   SpO2 98%   BMI 19.05 kg/m   General:   Alert,  Well-developed, well-nourished, pleasant and cooperative in NAD Lungs:  Clear throughout to auscultation.   Heart:  Regular rate and rhythm; no murmurs, clicks, rubs,  or gallops. Abdomen:  Soft, nontender and nondistended. Normal bowel sounds.   Neuro/Psych:  Alert and cooperative. Normal mood and affect. A and O x 3   @Roselina Burgueno  Sena Slate, MD, [facility]  Gastroenterology [phone] (pager) 01/16/2023 7:54 AM@

## 2023-01-16 NOTE — Op Note (Signed)
[facility] Patient Name: [name] Case Procedure Date: 01/16/2023 7:51 AM MRN: [id number] Endoscopist: Iva Boop , MD, [id number] Age: 62 Referring MD:  Date of Birth: 06/21/1961 Gender: Female Account #: [id number] Procedure:                Colonoscopy Indications:              Screening for colorectal malignant neoplasm, Last                            colonoscopy: June 2014 Medicines:                Monitored Anesthesia Care Procedure:                Pre-Anesthesia Assessment:                           - Prior to the procedure, a History and Physical                            was performed, and patient medications and                            allergies were reviewed. The patient's tolerance of                            previous anesthesia was also reviewed. The risks                            and benefits of the procedure and the sedation                            options and risks were discussed with the patient.                            All questions were answered, and informed consent                            was obtained. Prior Anticoagulants: The patient has                            taken no anticoagulant or antiplatelet agents. ASA                            Grade Assessment: II - A patient with mild systemic                            disease. After reviewing the risks and benefits,                            the patient was deemed in satisfactory condition to                            undergo the procedure.  After obtaining informed consent, the colonoscope                            was passed under direct vision. Throughout the                            procedure, the patient's blood pressure, pulse, and                            oxygen saturations were monitored continuously. The                            Olympus PCF-H190DL (#[id number]) Colonoscope was                            introduced through the anus and  advanced to the the                            cecum, identified by appendiceal orifice and                            ileocecal valve. The colonoscopy was performed                            without difficulty. The patient tolerated the                            procedure well. The quality of the bowel                            preparation was good. The bowel preparation used                            was SUPREP via split dose instruction. The                            ileocecal valve, appendiceal orifice, and rectum                            were photographed. Scope In: 8:01:52 AM Scope Out: 8:14:33 AM Scope Withdrawal Time: 0 hours 9 minutes 53 seconds  Total Procedure Duration: 0 hours 12 minutes 41 seconds  Findings:                 The perianal and digital rectal examinations were                            normal.                           Internal hemorrhoids were found.                           The exam was otherwise without abnormality on  direct and retroflexion views. Complications:            No immediate complications. Estimated Blood Loss:     Estimated blood loss: none. Impression:               - Internal hemorrhoids.                           - The examination was otherwise normal on direct                            and retroflexion views.                           - No specimens collected. Recommendation:           - Patient has a contact number available for                            emergencies. The signs and symptoms of potential                            delayed complications were discussed with the                            patient. Return to normal activities tomorrow.                            Written discharge instructions were provided to the                            patient.                           - Resume previous diet.                           - Continue present medications.                           - Repeat  colonoscopy in 10 years for screening                            purposes. Iva Boop, MD 01/16/2023 8:26:54 AM This report has been signed electronically.

## 2023-01-16 NOTE — Progress Notes (Signed)
Uneventful anesthetic. Report to pacu rn. Vss. Care resumed by rn. 

## 2023-01-16 NOTE — Patient Instructions (Addendum)
No polyps or cancer were seen!  Hemorrhoids were swollen some - happens with the prep.  Next routine colonoscopy or other screening test in 10 years - 2034.  I appreciate the opportunity to care for you. Iva Boop, MD, FACG    YOU HAD AN ENDOSCOPIC PROCEDURE TODAY AT THE [facility]:   Refer to the procedure report that was given to you for any specific questions about what was found during the examination.  If the procedure report does not answer your questions, please call your gastroenterologist to clarify.  If you requested that your care partner not be given the details of your procedure findings, then the procedure report has been included in a sealed envelope for you to review at your convenience later.  YOU SHOULD EXPECT: Some feelings of bloating in the abdomen. Passage of more gas than usual.  Walking can help get rid of the air that was put into your GI tract during the procedure and reduce the bloating. If you had a lower endoscopy (such as a colonoscopy or flexible sigmoidoscopy) you may notice spotting of blood in your stool or on the toilet paper. If you underwent a bowel prep for your procedure, you may not have a normal bowel movement for a few days.  Please Note:  You might notice some irritation and congestion in your nose or some drainage.  This is from the oxygen used during your procedure.  There is no need for concern and it should clear up in a day or so.  SYMPTOMS TO REPORT IMMEDIATELY:  Following lower endoscopy (colonoscopy or flexible sigmoidoscopy):  Excessive amounts of blood in the stool  Significant tenderness or worsening of abdominal pains  Swelling of the abdomen that is new, acute  Fever of 100F or higher  For urgent or emergent issues, a gastroenterologist can be reached at any hour by calling [phone]. Do not use MyChart messaging for urgent concerns.    DIET:  We do recommend a small meal at first, but then you may proceed  to your regular diet.  Drink plenty of fluids but you should avoid alcoholic beverages for 24 hours.  ACTIVITY:  You should plan to take it easy for the rest of today and you should NOT DRIVE or use heavy machinery until tomorrow (because of the sedation medicines used during the test).    FOLLOW UP: Our staff will call the number listed on your records the next business day following your procedure.  We will call around 7:15- 8:00 am to check on you and address any questions or concerns that you may have regarding the information given to you following your procedure. If we do not reach you, we will leave a message.     If any biopsies were taken you will be contacted by phone or by letter within the next 1-3 weeks.  Please call us at [phone] if you have not heard about the biopsies in 3 weeks.    SIGNATURES/CONFIDENTIALITY: You and/or your care partner have signed paperwork which will be entered into your electronic medical record.  These signatures attest to the fact that that the information above on your After Visit Summary has been reviewed and is understood.  Full responsibility of the confidentiality of this discharge information lies with you and/or your care-partner.

## 2023-01-17 ENCOUNTER — Telehealth: Payer: Self-pay

## 2023-01-17 DIAGNOSIS — M7712 Lateral epicondylitis, left elbow: Secondary | ICD-10-CM | POA: Diagnosis not present

## 2023-01-17 NOTE — Telephone Encounter (Signed)
  Follow up Call-     01/16/2023    7:21 AM  Call back number  Post procedure Call Back phone  # [phone]  Permission to leave phone message Yes     Patient questions:  Do you have a fever, pain , or abdominal swelling? No. Pain Score  0 *  Have you tolerated food without any problems? Yes.    Have you been able to return to your normal activities? Yes.    Do you have any questions about your discharge instructions: Diet   No. Medications  No. Follow up visit  No.  Do you have questions or concerns about your Care? No.  Actions: * If pain score is 4 or above: No action needed, pain <4.

## 2023-01-22 DIAGNOSIS — F411 Generalized anxiety disorder: Secondary | ICD-10-CM | POA: Diagnosis not present

## 2023-01-22 DIAGNOSIS — F4323 Adjustment disorder with mixed anxiety and depressed mood: Secondary | ICD-10-CM | POA: Diagnosis not present

## 2023-01-24 ENCOUNTER — Ambulatory Visit (INDEPENDENT_AMBULATORY_CARE_PROVIDER_SITE_OTHER)
Admission: RE | Admit: 2023-01-24 | Discharge: 2023-01-24 | Disposition: A | Discharge status: Home / Self Care | Payer: BC Managed Care – PPO | Source: Ambulatory Visit | Attending: Family Medicine | Admitting: Family Medicine

## 2023-01-24 DIAGNOSIS — M85851 Other specified disorders of bone density and structure, right thigh: Secondary | ICD-10-CM | POA: Diagnosis not present

## 2023-01-30 DIAGNOSIS — M7712 Lateral epicondylitis, left elbow: Secondary | ICD-10-CM | POA: Diagnosis not present

## 2023-02-08 DIAGNOSIS — M25522 Pain in left elbow: Secondary | ICD-10-CM | POA: Diagnosis not present

## 2023-02-21 DIAGNOSIS — F411 Generalized anxiety disorder: Secondary | ICD-10-CM | POA: Diagnosis not present

## 2023-02-21 DIAGNOSIS — F5105 Insomnia due to other mental disorder: Secondary | ICD-10-CM | POA: Diagnosis not present

## 2023-02-21 DIAGNOSIS — F331 Major depressive disorder, recurrent, moderate: Secondary | ICD-10-CM | POA: Diagnosis not present

## 2023-02-23 DIAGNOSIS — M25522 Pain in left elbow: Secondary | ICD-10-CM | POA: Diagnosis not present

## 2023-02-26 ENCOUNTER — Ambulatory Visit
Admission: RE | Admit: 2023-02-26 | Discharge: 2023-02-26 | Disposition: A | Discharge status: Home / Self Care | Payer: BC Managed Care – PPO | Source: Ambulatory Visit | Attending: Family Medicine | Admitting: Family Medicine

## 2023-02-26 DIAGNOSIS — Z1231 Encounter for screening mammogram for malignant neoplasm of breast: Secondary | ICD-10-CM | POA: Diagnosis not present

## 2023-03-22 DIAGNOSIS — M25522 Pain in left elbow: Secondary | ICD-10-CM | POA: Diagnosis not present

## 2023-05-01 DIAGNOSIS — M25522 Pain in left elbow: Secondary | ICD-10-CM | POA: Diagnosis not present

## 2023-05-16 DIAGNOSIS — F331 Major depressive disorder, recurrent, moderate: Secondary | ICD-10-CM | POA: Diagnosis not present

## 2023-05-16 DIAGNOSIS — F411 Generalized anxiety disorder: Secondary | ICD-10-CM | POA: Diagnosis not present

## 2023-05-16 DIAGNOSIS — F5105 Insomnia due to other mental disorder: Secondary | ICD-10-CM | POA: Diagnosis not present

## 2023-06-04 DIAGNOSIS — M7712 Lateral epicondylitis, left elbow: Secondary | ICD-10-CM | POA: Diagnosis not present

## 2023-06-14 DIAGNOSIS — M7712 Lateral epicondylitis, left elbow: Secondary | ICD-10-CM | POA: Diagnosis not present

## 2023-06-19 DIAGNOSIS — M7712 Lateral epicondylitis, left elbow: Secondary | ICD-10-CM | POA: Diagnosis not present

## 2023-06-27 DIAGNOSIS — M7712 Lateral epicondylitis, left elbow: Secondary | ICD-10-CM | POA: Diagnosis not present

## 2023-07-04 DIAGNOSIS — M7712 Lateral epicondylitis, left elbow: Secondary | ICD-10-CM | POA: Diagnosis not present

## 2023-07-10 DIAGNOSIS — M7712 Lateral epicondylitis, left elbow: Secondary | ICD-10-CM | POA: Diagnosis not present

## 2023-07-17 ENCOUNTER — Encounter: Payer: Self-pay | Admitting: Family Medicine

## 2023-07-17 DIAGNOSIS — R233 Spontaneous ecchymoses: Secondary | ICD-10-CM

## 2023-07-18 DIAGNOSIS — M7712 Lateral epicondylitis, left elbow: Secondary | ICD-10-CM | POA: Diagnosis not present

## 2023-07-23 ENCOUNTER — Other Ambulatory Visit (INDEPENDENT_AMBULATORY_CARE_PROVIDER_SITE_OTHER): Payer: BC Managed Care – PPO

## 2023-07-23 DIAGNOSIS — R233 Spontaneous ecchymoses: Secondary | ICD-10-CM | POA: Diagnosis not present

## 2023-07-23 LAB — COMPREHENSIVE METABOLIC PANEL
ALT: 31 U/L (ref 0–35)
AST: 31 U/L (ref 0–37)
Albumin: 4.3 g/dL (ref 3.5–5.2)
Alkaline Phosphatase: 100 U/L (ref 39–117)
BUN: 17 mg/dL (ref 6–23)
CO2: 24 meq/L (ref 19–32)
Calcium: 9.2 mg/dL (ref 8.4–10.5)
Chloride: 102 meq/L (ref 96–112)
Creatinine, Ser: 0.7 mg/dL (ref 0.40–1.20)
GFR: 93.51 mL/min (ref >60.00)
Glucose, Bld: 85 mg/dL (ref 70–99)
Potassium: 3.8 meq/L (ref 3.5–5.1)
Sodium: 138 meq/L (ref 135–145)
Total Bilirubin: 0.6 mg/dL (ref 0.2–1.2)
Total Protein: 6.9 g/dL (ref 6.0–8.3)

## 2023-07-23 LAB — CBC WITH DIFFERENTIAL/PLATELET
Basophils Absolute: 0.1 10*3/uL (ref 0.0–0.1)
Basophils Relative: 1 % (ref 0.0–3.0)
Eosinophils Absolute: 0.1 10*3/uL (ref 0.0–0.7)
Eosinophils Relative: 1.9 % (ref 0.0–5.0)
HCT: 37.5 % (ref 36.0–46.0)
Hemoglobin: 12.3 g/dL (ref 12.0–15.0)
Lymphocytes Relative: 22.4 % (ref 12.0–46.0)
Lymphs Abs: 1.2 10*3/uL (ref 0.7–4.0)
MCHC: 32.7 g/dL (ref 30.0–36.0)
MCV: 94.3 fL (ref 78.0–100.0)
Monocytes Absolute: 0.4 10*3/uL (ref 0.1–1.0)
Monocytes Relative: 8.2 % (ref 3.0–12.0)
Neutro Abs: 3.6 10*3/uL (ref 1.4–7.7)
Neutrophils Relative %: 66.5 % (ref 43.0–77.0)
Platelets: 292 10*3/uL (ref 150.0–400.0)
RBC: 3.98 Mil/uL (ref 3.87–5.11)
RDW: 14 % (ref 11.5–15.5)
WBC: 5.3 10*3/uL (ref 4.0–10.5)

## 2023-07-23 LAB — PROTIME-INR
INR: 1 {ratio} (ref 0.8–1.0)
Prothrombin Time: 10.7 s (ref 9.6–13.1)

## 2023-07-23 LAB — APTT: aPTT: 29.1 s (ref 25.4–36.8)

## 2023-07-24 DIAGNOSIS — L814 Other melanin hyperpigmentation: Secondary | ICD-10-CM | POA: Diagnosis not present

## 2023-07-24 DIAGNOSIS — L821 Other seborrheic keratosis: Secondary | ICD-10-CM | POA: Diagnosis not present

## 2023-07-24 DIAGNOSIS — D225 Melanocytic nevi of trunk: Secondary | ICD-10-CM | POA: Diagnosis not present

## 2023-07-25 DIAGNOSIS — M7712 Lateral epicondylitis, left elbow: Secondary | ICD-10-CM | POA: Diagnosis not present

## 2023-08-01 DIAGNOSIS — F5105 Insomnia due to other mental disorder: Secondary | ICD-10-CM | POA: Diagnosis not present

## 2023-08-01 DIAGNOSIS — M7712 Lateral epicondylitis, left elbow: Secondary | ICD-10-CM | POA: Diagnosis not present

## 2023-08-01 DIAGNOSIS — F411 Generalized anxiety disorder: Secondary | ICD-10-CM | POA: Diagnosis not present

## 2023-08-01 DIAGNOSIS — F331 Major depressive disorder, recurrent, moderate: Secondary | ICD-10-CM | POA: Diagnosis not present

## 2023-08-13 DIAGNOSIS — M7712 Lateral epicondylitis, left elbow: Secondary | ICD-10-CM | POA: Diagnosis not present

## 2023-08-22 DIAGNOSIS — M7712 Lateral epicondylitis, left elbow: Secondary | ICD-10-CM | POA: Diagnosis not present

## 2023-09-04 ENCOUNTER — Telehealth: Payer: BC Managed Care – PPO | Admitting: Physician Assistant

## 2023-09-04 DIAGNOSIS — B029 Zoster without complications: Secondary | ICD-10-CM | POA: Diagnosis not present

## 2023-09-04 MED ORDER — VALACYCLOVIR HCL 1 G PO TABS
1000.0000 mg | ORAL_TABLET | Freq: Three times a day (TID) | ORAL | 0 refills | Status: AC
Start: 2023-09-04 — End: 2023-09-11

## 2023-09-04 NOTE — Progress Notes (Signed)

## 2023-09-24 DIAGNOSIS — L578 Other skin changes due to chronic exposure to nonionizing radiation: Secondary | ICD-10-CM | POA: Diagnosis not present

## 2023-09-24 DIAGNOSIS — D2239 Melanocytic nevi of other parts of face: Secondary | ICD-10-CM | POA: Diagnosis not present

## 2023-09-24 DIAGNOSIS — L812 Freckles: Secondary | ICD-10-CM | POA: Diagnosis not present

## 2023-09-30 ENCOUNTER — Other Ambulatory Visit: Payer: Self-pay | Admitting: Family Medicine

## 2023-10-18 DIAGNOSIS — M7712 Lateral epicondylitis, left elbow: Secondary | ICD-10-CM | POA: Diagnosis not present

## 2023-10-25 DIAGNOSIS — Z124 Encounter for screening for malignant neoplasm of cervix: Secondary | ICD-10-CM | POA: Diagnosis not present

## 2023-10-25 DIAGNOSIS — Z681 Body mass index (BMI) 19 or less, adult: Secondary | ICD-10-CM | POA: Diagnosis not present

## 2023-10-25 DIAGNOSIS — Z1151 Encounter for screening for human papillomavirus (HPV): Secondary | ICD-10-CM | POA: Diagnosis not present

## 2023-10-25 DIAGNOSIS — Z01419 Encounter for gynecological examination (general) (routine) without abnormal findings: Secondary | ICD-10-CM | POA: Diagnosis not present

## 2023-10-31 LAB — HM PAP SMEAR: HPV, high-risk: NEGATIVE

## 2023-11-07 DIAGNOSIS — M25562 Pain in left knee: Secondary | ICD-10-CM | POA: Diagnosis not present

## 2023-12-02 ENCOUNTER — Other Ambulatory Visit: Payer: Self-pay | Admitting: Internal Medicine

## 2023-12-19 DIAGNOSIS — M545 Low back pain, unspecified: Secondary | ICD-10-CM | POA: Diagnosis not present

## 2023-12-19 DIAGNOSIS — M25551 Pain in right hip: Secondary | ICD-10-CM | POA: Diagnosis not present

## 2023-12-26 DIAGNOSIS — M25551 Pain in right hip: Secondary | ICD-10-CM | POA: Diagnosis not present

## 2024-01-09 DIAGNOSIS — J069 Acute upper respiratory infection, unspecified: Secondary | ICD-10-CM | POA: Diagnosis not present

## 2024-01-14 ENCOUNTER — Ambulatory Visit (INDEPENDENT_AMBULATORY_CARE_PROVIDER_SITE_OTHER): Payer: BC Managed Care – PPO | Admitting: Family Medicine

## 2024-01-14 ENCOUNTER — Encounter: Payer: Self-pay | Admitting: Family Medicine

## 2024-01-14 ENCOUNTER — Other Ambulatory Visit: Payer: Self-pay | Admitting: Family Medicine

## 2024-01-14 VITALS — BP 108/72 | HR 66 | Temp 97.2°F | Ht 66.0 in | Wt 118.0 lb

## 2024-01-14 DIAGNOSIS — E538 Deficiency of other specified B group vitamins: Secondary | ICD-10-CM | POA: Diagnosis not present

## 2024-01-14 DIAGNOSIS — J329 Chronic sinusitis, unspecified: Secondary | ICD-10-CM

## 2024-01-14 DIAGNOSIS — B9689 Other specified bacterial agents as the cause of diseases classified elsewhere: Secondary | ICD-10-CM

## 2024-01-14 DIAGNOSIS — Z0001 Encounter for general adult medical examination with abnormal findings: Secondary | ICD-10-CM

## 2024-01-14 DIAGNOSIS — E039 Hypothyroidism, unspecified: Secondary | ICD-10-CM | POA: Diagnosis not present

## 2024-01-14 DIAGNOSIS — E785 Hyperlipidemia, unspecified: Secondary | ICD-10-CM | POA: Diagnosis not present

## 2024-01-14 DIAGNOSIS — Z0000 Encounter for general adult medical examination without abnormal findings: Secondary | ICD-10-CM

## 2024-01-14 MED ORDER — AMOXICILLIN-POT CLAVULANATE 875-125 MG PO TABS: 1.0000 | ORAL_TABLET | Freq: Two times a day (BID) | ORAL | 0 refills | Status: AC

## 2024-01-14 NOTE — Progress Notes (Signed)
 Phone [phone] In person visit   Subjective:   [name] Case is a 63 y.o. year old very pleasant female patient who presents for/with See problem oriented charting  Past Medical History-  Patient Active Problem List   Diagnosis Date Noted   Disorder of vitamin B12 10/13/2020    Priority: Medium    Osteopenia 01/02/2020    Priority: Medium    Hyperlipidemia 12/03/2019    Priority: Medium    Anxiety 10/10/2010    Priority: Medium    Hypothyroidism 07/18/2007    Priority: Medium    GERD 07/18/2007    Priority: Medium    Perimenopausal vasomotor symptoms 12/21/2022    Priority: Low   Dyspareunia in female 09/22/2022    Priority: Low   Vaginal atrophy 09/22/2022    Priority: Low   Syncope and collapse 05/01/2017    Priority: Low   Dysphagia 02/24/2011    Priority: Low   Mitral valve prolapse 07/18/2007    Priority: Low   Left arm numbness 04/30/2017    Priority: 1.    Medications- reviewed and updated Current Outpatient Medications  Medication Sig Dispense Refill   buPROPion  (WELLBUTRIN  XL) 300 MG 24 hr tablet Take 300 mg by mouth daily.     cyanocobalamin  1000 MCG tablet Take 1,000 mcg by mouth daily.     famotidine  (PEPCID ) 40 MG tablet TAKE 1 TABLET(40 MG) BY MOUTH DAILY AS NEEDED FOR HEARTBURN OR INDIGESTION 90 tablet 3   levothyroxine  (SYNTHROID ) 150 MCG tablet TAKE 1 TABLET BY MOUTH EVERY DAY 90 tablet 3   zolpidem (AMBIEN) 10 MG tablet Take 10 mg by mouth at bedtime as needed. (Patient not taking: Reported on 01/14/2024)     No current facility-administered medications for this visit.     Objective:  BP 108/72   Pulse 66   Temp (!) 97.2 F (36.2 C)   Ht 5\' 6"  (1.676 m)   Wt 118 lb (53.5 kg)   LMP 11/28/2012   SpO2 99%   BMI 19.05 kg/m  Gen: NAD, resting comfortably Lungs clear  HEENT: Turbinates erythematous with yellow drainage on left- right only erythema but less pronounced and swollen, TM normal, pharynx mildly erythematous with no  tonsilar exudate or edema, reports left sided maxillary sinus tenderness    Assessment and Plan   # Lingering cough/sinus pressure S: Patient reports concern for lingering bronchitis.  2-3  weeks of symptoms- congestion is primarily nasal and a lot of discharge- yellow/green, has also had nosebleeds, post nasal drip, runny nose, sinus pressure, cough mainly dry.  She took prednisone and antibiotics azithromycin  but continues to have cough- went to urgent care about a week ago as was having worsening symptoms as noted some chest tightness with it.  More thirsty with illness. Also given inhaler which helps some.  -chest seems to be improving but not doing much better from sinus perspective  -mucinex helps some - home COVID and flu test negative - a lot of work related stress A/P:  Sinus infection/Sinusitis Bacterial based on: Symptoms >10 days AND double sickening  Treatment: -symptomatic care with Plain mucinex or mucinex- DM (if you want to have something to help with cough as well) Sinus rinses like a Neti Pot or Neilmed sinus rinse (make sure to follow instructions for water preparation) Tylenol  650 mg every 6 hours to help with sinus pressure -Antibiotic indicated: yes Augmentin  for 7 days -as previously diagnosed with bronchitis we examined lungs which were clear but if ont improving  or worsens id like to get chest x-ray  Finally, we reviewed reasons to return to care including if symptoms worsen or persist  (despite above treatments) or new concerns arise (particularly fever or shortness of breath)  Recommended follow up: as needed for acute concern   Lab/Order associations:   ICD-10-CM   1. Bacterial sinusitis  J32.9    B96.89     Meds ordered this encounter  Medications   amoxicillin -clavulanate (AUGMENTIN ) 875-125 MG tablet    Sig: Take 1 tablet by mouth 2 (two) times daily for 7 days.    Dispense:  14 tablet    Refill:  0    Return precautions advised.  Clarisa Crooked, MD

## 2024-01-14 NOTE — Progress Notes (Signed)
 Phone [phone]   Subjective:  Patient presents today for their annual physical. Chief complaint-noted.   See problem oriented charting- ROS- full  review of systems was completed and negative Per full ROS sheet completed by patient except for topics noted under acute/chronic concerns- particularly congestion issues  The following were reviewed and entered/updated in epic: Past Medical History:  Diagnosis Date   Abnormal EKG    Allergy    Anemia    Anxiety    Arthritis    Blood transfusion without reported diagnosis    Depression    Disorder of sebaceous glands    sebaceous neoplasm   Dysphagia 02/24/2011   Esophageal stretching x a few    GERD 07/18/2007   Heart murmur    HYPOTHYROIDISM 07/18/2007   Hypothyroidism 07/18/2007   Levothyroxine  Lab Results  Component Value Date   TSH 2.39 10/14/2013        Left arm numbness 04/30/2017   MITRAL VALVE PROLAPSE 07/18/2007   Mitral valve prolapse 07/18/2007   No dental prophylaxis    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   Pill esophagitis 2007   PONV (postoperative nausea and vomiting)    Syncope and collapse 05/01/2017   some concern TIA stroke- eventually told no   Ulcer 2007   Patient Active Problem List   Diagnosis Date Noted   Disorder of vitamin B12 10/13/2020    Priority: Medium    Osteopenia 01/02/2020    Priority: Medium    Hyperlipidemia 12/03/2019    Priority: Medium    Anxiety 10/10/2010    Priority: Medium    Hypothyroidism 07/18/2007    Priority: Medium    GERD 07/18/2007    Priority: Medium    Perimenopausal vasomotor symptoms 12/21/2022    Priority: Low   Dyspareunia in female 09/22/2022    Priority: Low   Vaginal atrophy 09/22/2022    Priority: Low   Syncope and collapse 05/01/2017    Priority: Low   Dysphagia 02/24/2011    Priority: Low   Mitral valve prolapse 07/18/2007    Priority: Low   Left arm numbness 04/30/2017    Priority: 1.   Past Surgical History:   Procedure Laterality Date   APPENDECTOMY     BREAST BIOPSY Left 11/21/2019   benign   BREAST EXCISIONAL BIOPSY Left 11/2019   BREAST LUMPECTOMY WITH RADIOACTIVE SEED LOCALIZATION Left 11/21/2019   Procedure: LEFT BREAST LUMPECTOMY WITH RADIOACTIVE SEED LOCALIZATION;  Surgeon: Caralyn Chandler, MD;  Location: [facility];  Service: General;  Laterality: Left;   COLONOSCOPY  multiple   Normal   UPPER GASTROINTESTINAL ENDOSCOPY  02/08/2009   inflammatory nodule in antrum, 50 Fr Maloney dilation   UPPER GASTROINTESTINAL ENDOSCOPY  2007x2   pill esophagitis/ulcer   UPPER GASTROINTESTINAL ENDOSCOPY  08/14/2002   erosive esophagitis and mild esophageal stricture - dilated    Family History  Problem Relation Age of Onset   Hypertension Father    Breast cancer Maternal Aunt        30s   Hypertension Brother    Colon cancer Neg Hx    Stomach cancer Neg Hx    Esophageal cancer Neg Hx    Rectal cancer Neg Hx     Medications- reviewed and updated Current Outpatient Medications  Medication Sig Dispense Refill   buPROPion  (WELLBUTRIN  XL) 300 MG 24 hr tablet Take 300 mg by mouth daily.     cyanocobalamin  1000 MCG tablet Take 1,000 mcg by mouth daily.  famotidine  (PEPCID ) 40 MG tablet TAKE 1 TABLET(40 MG) BY MOUTH DAILY AS NEEDED FOR HEARTBURN OR INDIGESTION 90 tablet 3   levothyroxine  (SYNTHROID ) 150 MCG tablet TAKE 1 TABLET BY MOUTH EVERY DAY 90 tablet 3   zolpidem (AMBIEN) 10 MG tablet Take 10 mg by mouth at bedtime as needed. (Patient not taking: Reported on 01/14/2024)     No current facility-administered medications for this visit.    Allergies-reviewed and updated Allergies  Allergen Reactions   Sulfamethoxazole     REACTION: hives   Tetracycline Hcl     REACTION: ulcers in stomach    Social History   Social History Narrative   Married. 2 children. Granddaughter 26 year old June 2024. English Bulldog and boston terrier. grandpet-rescue dog.    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 Starting to phase out. Also may do some consulting in 67   Husband still works full time- plans for 10 more years in 2021      Hobbies: exercise- pilates, travel ([place] favorite spot)   Objective  Objective:  BP 108/72   Pulse 66   Temp (!) 97.2 F (36.2 C)   Ht 5\' 6"  (1.676 m)   Wt 118 lb (53.5 kg)   LMP 11/28/2012   SpO2 99%   BMI 19.05 kg/m  Gen: NAD, resting comfortably HEENT: Mucous membranes are moist. Oropharynx normal. Nasal turbinate erythematous with significant yellow discharge on left. Right with erythema but less swollen and less discharge. Reports left sided maxillary pressure Neck: no thyromegaly CV: RRR no murmurs rubs or gallops Lungs: CTAB no crackles, wheeze, rhonchi Abdomen: soft/nontender/nondistended/normal bowel sounds. No rebound or guarding.  Ext: no edema Skin: warm, dry Neuro: grossly normal, moves all extremities, PERRLA   Assessment and Plan   63 y.o. female presenting for annual physical.  Health Maintenance counseling: 1. Anticipatory guidance: Patient counseled regarding regular dental exams -q6 months, eye exams - yearly,  avoiding smoking and second hand smoke , limiting alcohol to 1 beverage per day-perhaps 3 a week , no illicit drugs .   2. Risk factor reduction:  Advised patient of need for regular exercise and diet rich and fruits and vegetables to reduce risk of heart attack and stroke.  Exercise- joined [employer] and enjoys it- was doing well until recent illness.  Diet/weight management-recently healthy diet-healthy weight although would not mind further weight gain- could be prednisone related.  Wt Readings from Last 3 Encounters:  01/14/24 118 lb (53.5 kg)  01/16/23 118 lb (53.5 kg)  01/12/23 116 lb 6.4 oz (52.8 kg)  3. Immunizations/screenings/ancillary studies-unfortunately had case of shingles in January per  e-visit despite prior Shingrix- thankfully was mild.  Up-to-date other than declines COVID vaccination.  Offered Prevnar 20- but would wait until feeling better  Immunization History  Administered Date(s) Administered   Influenza Whole 06/13/2019   Influenza,inj,Quad PF,6+ Mos 05/01/2017   Influenza-Unspecified 05/14/2013, 05/28/2014, 05/14/2020, 05/17/2022   Moderna Sars-Covid-2 Vaccination 08/21/2019, 09/18/2019, 06/17/2020   Tdap 03/17/2016   Zoster Recombinant(Shingrix) 08/24/2022, 10/27/2022  4. Cervical cancer screening- has had recent examination by GYN-team requesting records.  [facility] 5. Breast cancer screening-  breast exam with GYN and mammogram 02/26/2023 and plans to do yearly- just had physical 6. Colon cancer screening - 01/16/2023 with plan for 10-year repeat 7. Skin cancer screening- Dr. Brunilda Capra office/[facility]. advised regular sunscreen use. Denies worrisome, changing, or new  skin lesions.  8. Birth control/STD check-monogamous/postmenopausal 9. Osteoporosis screening at 73- completed bone density 01/24/2023 with worst T-score -1.8 at right femoral neck.  Slightly worse from prior.  Encouraged weightbearing exercise- had been doing until ill, calcium- goal 1200mg  a day through diet, vitamin D 10. Smoking associated screening -never smoker  Status of chronic or acute concerns    See separate Problem oriented charting about sinusitis and its associated symptoms- cough, congestion, etc  a # Musculoskeletal-has seen orthopedics in recent months for lateral epicondylitis on the left (most resistant case they have seen) as well as left knee pain-[facility] -notes bruising over area. Has to be 1 year post OP so would be october- plans to get second opinion with [facility]- I suggested Dr. Aloha Arnold  -also right hip ultrasound guided injection recently  #Hypothyroidism S: Compliant with levothyroxine  150 mcg Lab Results  Component  Value Date   TSH 4.19 01/12/2023  A/P: hopefully stable- update tsh today. Continue current meds for now    #Insomnia/anxiety/stress S:Sees psychiatry and is on wellbutrin  300mg  XL. Also on Ambien 10 mg nightly as needed through them.  A/P: doing well with these despite recent stressors- continue current medications    GERD S: follows with GI as well- famotidine  40 mg as needed only  -prior Dexilant  60 mg and Carafate  A/P:doing well lately- continue current medications    #Mitral valve prolapse S: Noted on most recent echocardiogram in2018 doing syncope work-up- saw cardiology at that time A/P:  asymptomatic - monitor without repeat echocardiogram unless issues   # B12 deficiency S: Current treatment/medication (oral vs. IM): 1000 mcg B12 daily Lab Results  Component Value Date   VITAMINB12 >1500 (H) 01/12/2023  A/P: update today- could do every other day if still high   Recommended follow up: Return in about 1 year (around 01/13/2025) for physical or sooner if needed.Schedule b4 you leave.  Lab/Order associations: fasting   ICD-10-CM   1. Encounter for general adult medical examination with abnormal findings  Z00.01     2. Bacterial sinusitis  J32.9    B96.89     3. Hyperlipidemia, unspecified hyperlipidemia type  E78.5     4. Hypothyroidism, unspecified type  E03.9     5. Disorder of vitamin B12  E53.8       No orders of the defined types were placed in this encounter.   Return precautions advised.  Clarisa Crooked, MD

## 2024-01-14 NOTE — Patient Instructions (Addendum)
 Health Maintenance Due  Topic Date Due   Cervical Cancer Screening (HPV/Pap Cotest)  10/14/2023  Team requesting  Schedule a lab visit at the check out desk within 2 weeks as long as feeling better. Return for future fasting labs meaning nothing but water after midnight please. Ok to take your medications with water.   1200mg  calcium and best through diet if possible- team please give handout  Sinus infection/Sinusitis Bacterial based on: Symptoms >10 days AND double sickening  Treatment: -symptomatic care with Plain mucinex or mucinex- DM (if you want to have something to help with cough as well) Sinus rinses like a Neti Pot or Neilmed sinus rinse (make sure to follow instructions for water preparation) Tylenol  650 mg every 6 hours to help with sinus pressure -Antibiotic indicated: yes Augmentin  for 7 days -as previously diagnosed with bronchitis we examined lungs which were clear but if ont improving or worsens id like to get chest x-ray  Finally, we reviewed reasons to return to care including if symptoms worsen or persist  (despite above treatments) or new concerns arise (particularly fever or shortness of breath)  Recommended follow up: Return in about 1 year (around 01/13/2025) for physical or sooner if needed.Schedule b4 you leave.

## 2024-01-16 ENCOUNTER — Encounter: Payer: Self-pay | Admitting: Family Medicine

## 2024-01-16 DIAGNOSIS — F411 Generalized anxiety disorder: Secondary | ICD-10-CM | POA: Diagnosis not present

## 2024-01-16 DIAGNOSIS — F5105 Insomnia due to other mental disorder: Secondary | ICD-10-CM | POA: Diagnosis not present

## 2024-01-16 DIAGNOSIS — F331 Major depressive disorder, recurrent, moderate: Secondary | ICD-10-CM | POA: Diagnosis not present

## 2024-01-28 ENCOUNTER — Other Ambulatory Visit (INDEPENDENT_AMBULATORY_CARE_PROVIDER_SITE_OTHER)

## 2024-01-28 ENCOUNTER — Ambulatory Visit: Payer: Self-pay | Admitting: Family Medicine

## 2024-01-28 DIAGNOSIS — E039 Hypothyroidism, unspecified: Secondary | ICD-10-CM | POA: Diagnosis not present

## 2024-01-28 DIAGNOSIS — E785 Hyperlipidemia, unspecified: Secondary | ICD-10-CM

## 2024-01-28 DIAGNOSIS — E538 Deficiency of other specified B group vitamins: Secondary | ICD-10-CM | POA: Diagnosis not present

## 2024-01-28 LAB — CBC WITH DIFFERENTIAL/PLATELET
Basophils Absolute: 0.1 10*3/uL (ref 0.0–0.1)
Basophils Relative: 1.2 % (ref 0.0–3.0)
Eosinophils Absolute: 0.1 10*3/uL (ref 0.0–0.7)
Eosinophils Relative: 1.6 % (ref 0.0–5.0)
HCT: 36.1 % (ref 36.0–46.0)
Hemoglobin: 11.9 g/dL — ABNORMAL LOW (ref 12.0–15.0)
Lymphocytes Relative: 19 % (ref 12.0–46.0)
Lymphs Abs: 1.1 10*3/uL (ref 0.7–4.0)
MCHC: 33.1 g/dL (ref 30.0–36.0)
MCV: 90.4 fl (ref 78.0–100.0)
Monocytes Absolute: 0.4 10*3/uL (ref 0.1–1.0)
Monocytes Relative: 7.3 % (ref 3.0–12.0)
Neutro Abs: 4 10*3/uL (ref 1.4–7.7)
Neutrophils Relative %: 70.9 % (ref 43.0–77.0)
Platelets: 322 10*3/uL (ref 150.0–400.0)
RBC: 3.99 Mil/uL (ref 3.87–5.11)
RDW: 15.3 % (ref 11.5–15.5)
WBC: 5.6 10*3/uL (ref 4.0–10.5)

## 2024-01-28 LAB — COMPREHENSIVE METABOLIC PANEL WITH GFR
ALT: 45 U/L — ABNORMAL HIGH (ref 0–35)
AST: 27 U/L (ref 0–37)
Albumin: 4.3 g/dL (ref 3.5–5.2)
Alkaline Phosphatase: 165 U/L — ABNORMAL HIGH (ref 39–117)
BUN: 14 mg/dL (ref 6–23)
CO2: 28 meq/L (ref 19–32)
Calcium: 9.4 mg/dL (ref 8.4–10.5)
Chloride: 100 meq/L (ref 96–112)
Creatinine, Ser: 0.73 mg/dL (ref 0.40–1.20)
GFR: 88.6 mL/min (ref >60.00)
Glucose, Bld: 78 mg/dL (ref 70–99)
Potassium: 3.9 meq/L (ref 3.5–5.1)
Sodium: 137 meq/L (ref 135–145)
Total Bilirubin: 0.6 mg/dL (ref 0.2–1.2)
Total Protein: 7.1 g/dL (ref 6.0–8.3)

## 2024-01-28 LAB — LIPID PANEL
Cholesterol: 286 mg/dL — ABNORMAL HIGH (ref 0–200)
HDL: 127 mg/dL (ref >39.00)
LDL Cholesterol: 148 mg/dL — ABNORMAL HIGH (ref 0–99)
NonHDL: 158.99
Total CHOL/HDL Ratio: 2
Triglycerides: 56 mg/dL (ref 0.0–149.0)
VLDL: 11.2 mg/dL (ref 0.0–40.0)

## 2024-01-28 LAB — TSH: TSH: 4.45 u[IU]/mL (ref 0.35–5.50)

## 2024-01-28 LAB — VITAMIN B12: Vitamin B-12: 1348 pg/mL — ABNORMAL HIGH (ref 211–911)

## 2024-01-29 ENCOUNTER — Other Ambulatory Visit: Payer: Self-pay

## 2024-01-29 DIAGNOSIS — D649 Anemia, unspecified: Secondary | ICD-10-CM

## 2024-01-29 DIAGNOSIS — R7401 Elevation of levels of liver transaminase levels: Secondary | ICD-10-CM

## 2024-02-27 ENCOUNTER — Ambulatory Visit
Admission: RE | Admit: 2024-02-27 | Discharge: 2024-02-27 | Disposition: A | Discharge status: Home / Self Care | Source: Ambulatory Visit | Attending: Family Medicine | Admitting: Family Medicine

## 2024-02-27 DIAGNOSIS — Z1231 Encounter for screening mammogram for malignant neoplasm of breast: Secondary | ICD-10-CM | POA: Diagnosis not present

## 2024-02-27 DIAGNOSIS — Z0000 Encounter for general adult medical examination without abnormal findings: Secondary | ICD-10-CM

## 2024-03-03 ENCOUNTER — Telehealth: Admitting: Physician Assistant

## 2024-03-03 DIAGNOSIS — H109 Unspecified conjunctivitis: Secondary | ICD-10-CM

## 2024-03-03 MED ORDER — OFLOXACIN 0.3 % OP SOLN
1.0000 [drp] | Freq: Four times a day (QID) | OPHTHALMIC | 0 refills | Status: AC
Start: 2024-03-03 — End: 2024-03-08

## 2024-03-03 NOTE — Progress Notes (Signed)

## 2024-03-11 ENCOUNTER — Encounter: Payer: Self-pay | Admitting: Family Medicine

## 2024-03-13 ENCOUNTER — Ambulatory Visit: Admitting: Family

## 2024-03-18 ENCOUNTER — Encounter: Payer: Self-pay | Admitting: Physician Assistant

## 2024-03-18 ENCOUNTER — Ambulatory Visit: Admitting: Physician Assistant

## 2024-03-18 VITALS — BP 120/76 | HR 59 | Temp 97.7°F | Ht 66.0 in | Wt 123.0 lb

## 2024-03-18 DIAGNOSIS — K5900 Constipation, unspecified: Secondary | ICD-10-CM

## 2024-03-18 DIAGNOSIS — R3989 Other symptoms and signs involving the genitourinary system: Secondary | ICD-10-CM | POA: Diagnosis not present

## 2024-03-18 DIAGNOSIS — R351 Nocturia: Secondary | ICD-10-CM | POA: Diagnosis not present

## 2024-03-18 LAB — POCT URINALYSIS DIPSTICK
Bilirubin, UA: NEGATIVE
Blood, UA: NEGATIVE
Glucose, UA: NEGATIVE
Ketones, UA: NEGATIVE
Leukocytes, UA: NEGATIVE
Nitrite, UA: NEGATIVE
Protein, UA: NEGATIVE
Spec Grav, UA: 1.01 (ref 1.010–1.025)
Urobilinogen, UA: 0.2 U/dL
pH, UA: 6 (ref 5.0–8.0)

## 2024-03-18 LAB — CBC WITH DIFFERENTIAL/PLATELET
Basophils Absolute: 0 K/uL (ref 0.0–0.1)
Basophils Relative: 0.9 % (ref 0.0–3.0)
Eosinophils Absolute: 0.1 K/uL (ref 0.0–0.7)
Eosinophils Relative: 2.2 % (ref 0.0–5.0)
HCT: 37.1 % (ref 36.0–46.0)
Hemoglobin: 12.2 g/dL (ref 12.0–15.0)
Lymphocytes Relative: 25.9 % (ref 12.0–46.0)
Lymphs Abs: 1.4 K/uL (ref 0.7–4.0)
MCHC: 32.8 g/dL (ref 30.0–36.0)
MCV: 90.7 fl (ref 78.0–100.0)
Monocytes Absolute: 0.4 K/uL (ref 0.1–1.0)
Monocytes Relative: 8.3 % (ref 3.0–12.0)
Neutro Abs: 3.3 K/uL (ref 1.4–7.7)
Neutrophils Relative %: 62.7 % (ref 43.0–77.0)
Platelets: 290 K/uL (ref 150.0–400.0)
RBC: 4.09 Mil/uL (ref 3.87–5.11)
RDW: 14.8 % (ref 11.5–15.5)
WBC: 5.3 K/uL (ref 4.0–10.5)

## 2024-03-18 LAB — COMPREHENSIVE METABOLIC PANEL WITH GFR
ALT: 20 U/L (ref 0–35)
AST: 23 U/L (ref 0–37)
Albumin: 4.2 g/dL (ref 3.5–5.2)
Alkaline Phosphatase: 106 U/L (ref 39–117)
BUN: 15 mg/dL (ref 6–23)
CO2: 31 meq/L (ref 19–32)
Calcium: 9.7 mg/dL (ref 8.4–10.5)
Chloride: 101 meq/L (ref 96–112)
Creatinine, Ser: 0.7 mg/dL (ref 0.40–1.20)
GFR: 93.08 mL/min (ref >60.00)
Glucose, Bld: 82 mg/dL (ref 70–99)
Potassium: 4.4 meq/L (ref 3.5–5.1)
Sodium: 139 meq/L (ref 135–145)
Total Bilirubin: 0.5 mg/dL (ref 0.2–1.2)
Total Protein: 6.6 g/dL (ref 6.0–8.3)

## 2024-03-18 LAB — URINALYSIS, MICROSCOPIC ONLY: WBC, UA: NONE SEEN (ref 0–?)

## 2024-03-18 NOTE — Progress Notes (Signed)
 [name] Case is a 63 y.o. female here for a follow up of a pre-existing problem.  History of Present Illness:   Chief Complaint  Patient presents with   Urinary symptoms    Pt c/o bladder pressure, nocturia, has seen speaks of blood in urine, bloating x 5-6 weeks. Denies back pain, no fever or chills.    HPI  Discussed the use of AI scribe software for clinical note transcription with the patient, who gave verbal consent to proceed.  History of Present Illness [name] Case is a 63 year old female who presents with nocturnal urinary frequency and bloating.  For the past five to six weeks, she experiences increased nocturnal urinary frequency, waking at least four times to urinate. She feels a sensation of pressure regardless of sleeping position and passes a significant amount of urine each time. Occasionally, she observes black specks in her urine, though not present in today's sample. She denies a history of kidney stones.  She feels extremely bloated and unable to relieve it. She has not used over-the-counter remedies like simethicone . She drinks water regularly and avoids sodas. There is a five-pound weight gain since June, attributed to bloating. No significant changes in weight or appetite, and she denies vaginal discharge or pain.  She experiences constipation, waiting until she feels the need to defecate. Bowel movements are infrequent and sometimes difficult. She has been advised to use polyethylene glycol or fiber but has not recently. Her surgical history includes an appendectomy, and a colonoscopy in 2024 showed normal results. An abdominal ultrasound in 2018 and a pelvic ultrasound in 2007 were normal. She reports no significant dietary changes, no known food intolerances, and no history of kidney stones. She feels urgency to urinate and sometimes strains during bowel movements.    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   Pill esophagitis 2007   PONV (postoperative nausea and vomiting)    Syncope and collapse 05/01/2017   some concern TIA stroke- eventually told no   Ulcer 2007     Social History   Tobacco Use   Smoking status: Never   Smokeless tobacco: Never  Vaping Use   Vaping status: Never Used  Substance Use Topics   Alcohol use: Yes    Alcohol/week: 3.0 standard drinks of alcohol    Types: 3 Glasses of wine per week   Drug use: No    Past Surgical History:  Procedure Laterality Date   APPENDECTOMY     BREAST BIOPSY Left 11/21/2019   benign   BREAST EXCISIONAL BIOPSY Left 11/2019   BREAST LUMPECTOMY WITH RADIOACTIVE SEED LOCALIZATION Left 11/21/2019   Procedure: LEFT BREAST LUMPECTOMY WITH RADIOACTIVE SEED LOCALIZATION;  Surgeon: Curvin Deward MOULD, MD;  Location: [facility];  Service: General;  Laterality:  Left;   COLONOSCOPY  multiple   Normal   UPPER GASTROINTESTINAL ENDOSCOPY  02/08/2009   inflammatory nodule in antrum, 50 Fr Maloney dilation   UPPER GASTROINTESTINAL ENDOSCOPY  2007x2   pill esophagitis/ulcer   UPPER GASTROINTESTINAL ENDOSCOPY  08/14/2002   erosive esophagitis and mild esophageal stricture - dilated    Family History  Problem Relation Age of Onset   Hypertension Father    Breast cancer Maternal Aunt        30s   Hypertension Brother    Colon cancer Neg Hx    Stomach cancer Neg Hx    Esophageal cancer  Neg Hx    Rectal cancer Neg Hx     Allergies  Allergen Reactions   Sulfamethoxazole     REACTION: hives   Tetracycline Hcl     REACTION: ulcers in stomach    Current Medications:   Current Outpatient Medications:    albuterol (VENTOLIN HFA) 108 (90 Base) MCG/ACT inhaler, Inhale 2 puffs into the lungs every 4 (four) hours as needed., Disp: , Rfl:    buPROPion  (WELLBUTRIN  XL) 300 MG 24 hr tablet, Take 300 mg by mouth daily., Disp: , Rfl:    cyanocobalamin  1000 MCG tablet, Take 1,000 mcg by mouth daily., Disp: , Rfl:    famotidine  (PEPCID ) 40 MG tablet, TAKE 1 TABLET(40 MG) BY MOUTH DAILY AS NEEDED FOR HEARTBURN OR INDIGESTION, Disp: 90 tablet, Rfl: 3   levothyroxine  (SYNTHROID ) 150 MCG tablet, TAKE 1 TABLET BY MOUTH EVERY DAY, Disp: 90 tablet, Rfl: 3   zolpidem (AMBIEN) 10 MG tablet, Take 10 mg by mouth at bedtime as needed., Disp: , Rfl:    Review of Systems:   Negative unless otherwise specified per HPI.  Vitals:   Vitals:   03/18/24 1118  BP: 120/76  Pulse: (!) 59  Temp: 97.7 F (36.5 C)  TempSrc: Temporal  SpO2: 99%  Weight: 123 lb (55.8 kg)  Height: 5' 6 (1.676 m)     Body mass index is 19.85 kg/m.  Physical Exam:   Physical Exam Vitals and nursing note reviewed.  Constitutional:      General: She is not in acute distress.    Appearance: She is well-developed. She is not ill-appearing or toxic-appearing.  Cardiovascular:     Rate and Rhythm: Normal rate and regular rhythm.     Pulses: Normal pulses.     Heart sounds: Normal heart sounds, S1 normal and S2 normal.  Pulmonary:     Effort: Pulmonary effort is normal.     Breath sounds: Normal breath sounds.  Abdominal:     General: Abdomen is flat. Bowel sounds are normal.     Palpations: Abdomen is soft.     Tenderness: There is abdominal tenderness in the right lower quadrant. There is no guarding or rebound.  Skin:    General: Skin is warm and dry.  Neurological:     Mental Status: She is alert.      GCS: GCS eye subscore is 4. GCS verbal subscore is 5. GCS motor subscore is 6.  Psychiatric:        Speech: Speech normal.        Behavior: Behavior normal. Behavior is cooperative.     Assessment and Plan:   Assessment and Plan Assessment & Plan Constipation with abdominal bloating and pressure Chronic constipation with bloating and pressure. Differential includes primary constipation, ovarian, or intestinal issues. Recent colonoscopy normal. - Start Miralax, one capful daily. - Order baseline blood work for  liver and kidney function, electrolytes, and infection counts. - Consider imaging if symptoms persist or worsen, prefer pelvic ultrasound. - Provide constipation management cheat sheet.  Nocturia and urinary frequency Increased nocturia and urinary frequency with urgency. Differential includes UTI or pressure from constipation. Urinalysis normal, further testing pending. - Send urine for further testing and culture. - Consider antibiotics if urine culture indicates infection. - Monitor for black specks in urine, consider urology referral if symptoms persist.    Lucie Buttner, PA-C

## 2024-03-18 NOTE — Patient Instructions (Addendum)
 It was great to see you!  To treat your constipation today: -Drink at least 64 oz of water daily -Add in 1 capful of polyethylene glycol (also known as Miralax, however generic is fine!) to beverage of choice daily  Goal is to have a formed, soft bowel movement regularly   -After a few days if no success, may increase to a total of 2 capfuls of polyethylene glycol/ Miralax daily  If still no results, please call the office   We will be in touch with all results and the plan -- if any NEW symptom(s), please let us  know.  Take care,  Lucie Buttner PA-C

## 2024-03-19 ENCOUNTER — Ambulatory Visit: Payer: Self-pay | Admitting: Physician Assistant

## 2024-03-19 LAB — URINE CULTURE
MICRO NUMBER:: 16789118
SPECIMEN QUALITY:: ADEQUATE

## 2024-03-24 DIAGNOSIS — M25551 Pain in right hip: Secondary | ICD-10-CM | POA: Diagnosis not present

## 2024-04-01 DIAGNOSIS — M25551 Pain in right hip: Secondary | ICD-10-CM | POA: Diagnosis not present

## 2024-04-01 DIAGNOSIS — M25851 Other specified joint disorders, right hip: Secondary | ICD-10-CM | POA: Diagnosis not present

## 2024-04-09 DIAGNOSIS — F5105 Insomnia due to other mental disorder: Secondary | ICD-10-CM | POA: Diagnosis not present

## 2024-04-09 DIAGNOSIS — M25551 Pain in right hip: Secondary | ICD-10-CM | POA: Diagnosis not present

## 2024-04-09 DIAGNOSIS — F411 Generalized anxiety disorder: Secondary | ICD-10-CM | POA: Diagnosis not present

## 2024-04-09 DIAGNOSIS — F331 Major depressive disorder, recurrent, moderate: Secondary | ICD-10-CM | POA: Diagnosis not present

## 2024-04-09 DIAGNOSIS — M25851 Other specified joint disorders, right hip: Secondary | ICD-10-CM | POA: Diagnosis not present

## 2024-04-23 DIAGNOSIS — M25551 Pain in right hip: Secondary | ICD-10-CM | POA: Diagnosis not present

## 2024-04-23 DIAGNOSIS — M25851 Other specified joint disorders, right hip: Secondary | ICD-10-CM | POA: Diagnosis not present

## 2024-04-28 DIAGNOSIS — M25551 Pain in right hip: Secondary | ICD-10-CM | POA: Diagnosis not present

## 2024-06-12 DIAGNOSIS — H18413 Arcus senilis, bilateral: Secondary | ICD-10-CM | POA: Diagnosis not present

## 2024-06-12 DIAGNOSIS — H35363 Drusen (degenerative) of macula, bilateral: Secondary | ICD-10-CM | POA: Diagnosis not present

## 2024-07-02 DIAGNOSIS — F331 Major depressive disorder, recurrent, moderate: Secondary | ICD-10-CM | POA: Diagnosis not present

## 2024-07-02 DIAGNOSIS — F411 Generalized anxiety disorder: Secondary | ICD-10-CM | POA: Diagnosis not present

## 2024-07-02 DIAGNOSIS — F5105 Insomnia due to other mental disorder: Secondary | ICD-10-CM | POA: Diagnosis not present

## 2024-07-21 DIAGNOSIS — M25551 Pain in right hip: Secondary | ICD-10-CM | POA: Diagnosis not present

## 2024-07-24 DIAGNOSIS — D225 Melanocytic nevi of trunk: Secondary | ICD-10-CM | POA: Diagnosis not present

## 2024-07-24 DIAGNOSIS — L821 Other seborrheic keratosis: Secondary | ICD-10-CM | POA: Diagnosis not present

## 2024-07-24 DIAGNOSIS — L814 Other melanin hyperpigmentation: Secondary | ICD-10-CM | POA: Diagnosis not present

## 2024-07-31 DIAGNOSIS — M25551 Pain in right hip: Secondary | ICD-10-CM | POA: Diagnosis not present

## 2025-01-15 ENCOUNTER — Encounter: Admitting: Family Medicine
